# Patient Record
Sex: Male | Born: 1940
Health system: Southern US, Community
[De-identification: ages and names within clinical notes are randomized; demographics above are authoritative.]

## PROBLEM LIST (undated history)

## (undated) DIAGNOSIS — E785 Hyperlipidemia, unspecified: Secondary | ICD-10-CM

## (undated) DIAGNOSIS — K810 Acute cholecystitis: Secondary | ICD-10-CM

## (undated) DIAGNOSIS — Z972 Presence of dental prosthetic device (complete) (partial): Secondary | ICD-10-CM

## (undated) DIAGNOSIS — I639 Cerebral infarction, unspecified: Secondary | ICD-10-CM

## (undated) DIAGNOSIS — H11442 Conjunctival cysts, left eye: Secondary | ICD-10-CM

## (undated) DIAGNOSIS — K469 Unspecified abdominal hernia without obstruction or gangrene: Secondary | ICD-10-CM

## (undated) DIAGNOSIS — I63412 Cerebral infarction due to embolism of left middle cerebral artery: Secondary | ICD-10-CM

## (undated) DIAGNOSIS — N200 Calculus of kidney: Secondary | ICD-10-CM

## (undated) DIAGNOSIS — I2510 Atherosclerotic heart disease of native coronary artery without angina pectoris: Secondary | ICD-10-CM

## (undated) DIAGNOSIS — I10 Essential (primary) hypertension: Secondary | ICD-10-CM

## (undated) DIAGNOSIS — C61 Malignant neoplasm of prostate: Secondary | ICD-10-CM

## (undated) DIAGNOSIS — C4442 Squamous cell carcinoma of skin of scalp and neck: Secondary | ICD-10-CM

## (undated) DIAGNOSIS — Z974 Presence of external hearing-aid: Secondary | ICD-10-CM

## (undated) HISTORY — PX: EYE SURGERY: SHX253

## (undated) HISTORY — DX: Hyperlipidemia, unspecified: E78.5

## (undated) HISTORY — DX: Unspecified abdominal hernia without obstruction or gangrene: K46.9

## (undated) HISTORY — DX: Squamous cell carcinoma of skin of scalp and neck: C44.42

## (undated) HISTORY — DX: Atherosclerotic heart disease of native coronary artery without angina pectoris: I25.10

## (undated) HISTORY — DX: Essential (primary) hypertension: I10

## (undated) HISTORY — DX: Cerebral infarction due to embolism of left middle cerebral artery: I63.412

## (undated) HISTORY — DX: Calculus of kidney: N20.0

## (undated) HISTORY — PX: FUNCTIONAL ENDOSCOPIC SINUS SURGERY: SUR616

---
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
## 2008-07-29 LAB — HM COLONOSCOPY: HM Colonoscopy: NORMAL

## 2010-09-20 ENCOUNTER — Inpatient Hospital Stay: Payer: Self-pay | Admitting: Internal Medicine

## 2011-05-23 ENCOUNTER — Other Ambulatory Visit: Payer: Self-pay | Admitting: Internal Medicine

## 2011-05-23 DIAGNOSIS — I10 Essential (primary) hypertension: Secondary | ICD-10-CM

## 2011-05-23 MED ORDER — QUINAPRIL HCL 20 MG PO TABS: 20.0000 mg | ORAL_TABLET | Freq: Two times a day (BID) | ORAL | Status: DC

## 2011-05-23 MED ORDER — SPIRONOLACTONE 50 MG PO TABS: 50.0000 mg | ORAL_TABLET | Freq: Every day | ORAL | Status: DC

## 2011-05-23 MED ORDER — HYDROCHLOROTHIAZIDE 25 MG PO TABS
25.0000 mg | ORAL_TABLET | Freq: Every day | ORAL | Status: DC
Start: 2011-05-23 — End: 2012-05-12

## 2011-07-03 ENCOUNTER — Encounter: Payer: Self-pay | Admitting: Internal Medicine

## 2011-07-03 ENCOUNTER — Ambulatory Visit (INDEPENDENT_AMBULATORY_CARE_PROVIDER_SITE_OTHER): Payer: Medicare Other | Admitting: Internal Medicine

## 2011-07-03 DIAGNOSIS — E1169 Type 2 diabetes mellitus with other specified complication: Secondary | ICD-10-CM | POA: Insufficient documentation

## 2011-07-03 DIAGNOSIS — M79609 Pain in unspecified limb: Secondary | ICD-10-CM

## 2011-07-03 DIAGNOSIS — I10 Essential (primary) hypertension: Secondary | ICD-10-CM

## 2011-07-03 DIAGNOSIS — M79673 Pain in unspecified foot: Secondary | ICD-10-CM

## 2011-07-03 DIAGNOSIS — E119 Type 2 diabetes mellitus without complications: Secondary | ICD-10-CM

## 2011-07-03 DIAGNOSIS — E785 Hyperlipidemia, unspecified: Secondary | ICD-10-CM

## 2011-07-03 DIAGNOSIS — Z23 Encounter for immunization: Secondary | ICD-10-CM

## 2011-07-03 LAB — COMPREHENSIVE METABOLIC PANEL
AST: 25 U/L (ref 0–37)
Alkaline Phosphatase: 65 U/L (ref 39–117)
Glucose, Bld: 145 mg/dL — ABNORMAL HIGH (ref 70–99)
Potassium: 4.1 mEq/L (ref 3.5–5.1)
Sodium: 140 mEq/L (ref 135–145)
Total Bilirubin: 1 mg/dL (ref 0.3–1.2)
Total Protein: 7.5 g/dL (ref 6.0–8.3)

## 2011-07-03 LAB — CBC WITH DIFFERENTIAL/PLATELET
Basophils Absolute: 0 10*3/uL (ref 0.0–0.1)
Eosinophils Absolute: 0.6 10*3/uL (ref 0.0–0.7)
Eosinophils Relative: 7.6 % — ABNORMAL HIGH (ref 0.0–5.0)
HCT: 40.7 % (ref 39.0–52.0)
Lymphs Abs: 1.7 10*3/uL (ref 0.7–4.0)
MCHC: 33.8 g/dL (ref 30.0–36.0)
MCV: 94.9 fl (ref 78.0–100.0)
Monocytes Absolute: 0.6 10*3/uL (ref 0.1–1.0)
Neutrophils Relative %: 60.9 % (ref 43.0–77.0)
Platelets: 205 10*3/uL (ref 150.0–400.0)
RDW: 12.6 % (ref 11.5–14.6)

## 2011-07-03 LAB — MICROALBUMIN / CREATININE URINE RATIO: Microalb, Ur: 0.5 mg/dL (ref 0.0–1.9)

## 2011-07-03 NOTE — Progress Notes (Signed)
Subjective:    Patient ID: Guy Phillips, male    DOB: Apr 09, 1941, 70 y.o.   MRN: [id number]  HPI Guy Phillips is a 70 year old male who presents for followup of diabetes, hypertension, and hyperlipidemia. He reports that he's been doing. Well. He denies any complaints. He reports that his blood sugars have been well controlled and less than 200. He did not bring a record of his blood sugars today. His only concern today is intermittent pain in his bilateral posterior heels. He notes that this pain typically occurs when he first wakes up in the morning and then improves throughout the day. He denies any swelling over his heels or ankles. He denies any fever or chills. He has not taken any medication for this pain and describes it as mild.  Outpatient Encounter Prescriptions as of 07/03/2011  Medication Sig Dispense Refill  . aspirin 81 MG tablet Take 81 mg by mouth daily.        . carvedilol (COREG) 12.5 MG tablet Take 12.5 mg by mouth every 12 (twelve) hours.        . Cholecalciferol (VITAMIN D3) 2000 UNITS TABS Take 1 tablet by mouth daily.        . Cinnamon 500 MG capsule Take 500 mg by mouth daily.        . clopidogrel (PLAVIX) 75 MG tablet Take 75 mg by mouth daily.        Marland Kitchen glipiZIDE (GLUCOTROL) 5 MG tablet Take 5 mg by mouth daily.        . hydrochlorothiazide 25 MG tablet Take 1 tablet (25 mg total) by mouth daily.  30 tablet  3  . metFORMIN (GLUCOPHAGE) 1000 MG tablet Take 1,000 mg by mouth 2 (two) times daily with a meal.        . Multiple Vitamin (MULTIVITAMIN) tablet Take 1 tablet by mouth daily.        . Omega-3 Fatty Acids (FISH OIL) 1200 MG CAPS Take 1 capsule by mouth daily.        . quinapril (ACCUPRIL) 20 MG tablet Take 1 tablet (20 mg total) by mouth 2 (two) times daily.  60 tablet  11  . rosuvastatin (CRESTOR) 20 MG tablet Take 20 mg by mouth daily.        . Saw Palmetto 450 MG CAPS Take 1 capsule by mouth 2 (two) times daily.        Marland Kitchen spironolactone (ALDACTONE) 50 MG  tablet Take 1 tablet (50 mg total) by mouth daily.  30 tablet  3  . Tadalafil (CIALIS PO) Take 1 tablet by mouth as needed.          Review of Systems  Constitutional: Negative for fever, chills, activity change, appetite change, fatigue and unexpected weight change.  Eyes: Negative for visual disturbance.  Respiratory: Negative for cough and shortness of breath.   Cardiovascular: Negative for chest pain, palpitations and leg swelling.  Gastrointestinal: Negative for abdominal pain and abdominal distention.  Genitourinary: Negative for dysuria, urgency and difficulty urinating.  Musculoskeletal: Negative for arthralgias and gait problem.  Skin: Negative for color change and rash.  Hematological: Negative for adenopathy.  Psychiatric/Behavioral: Negative for sleep disturbance and dysphoric mood. The patient is not nervous/anxious.    BP 133/79  Pulse 62  Temp(Src) 98.5 F (36.9 C) (Oral)  Resp 16  Ht 5\' 8"  (1.727 m)  Wt 259 lb 4 oz (117.595 kg)  BMI 39.42 kg/m2  SpO2 97%     Objective:   Physical  Exam  Constitutional: He is oriented to person, place, and time. He appears well-developed and well-nourished. No distress.  HENT:  Head: Normocephalic and atraumatic.  Right Ear: External ear normal.  Left Ear: External ear normal.  Nose: Nose normal.  Mouth/Throat: Oropharynx is clear and moist. No oropharyngeal exudate.  Eyes: Conjunctivae and EOM are normal. Pupils are equal, round, and reactive to light. Right eye exhibits no discharge. Left eye exhibits no discharge. No scleral icterus.  Neck: Normal range of motion. Neck supple. No tracheal deviation present. No thyromegaly present.  Cardiovascular: Normal rate, regular rhythm and normal heart sounds.  Exam reveals no gallop and no friction rub.   No murmur heard. Pulmonary/Chest: Effort normal and breath sounds normal. No respiratory distress. He has no wheezes. He has no rales. He exhibits no tenderness.  Musculoskeletal:  Normal range of motion. He exhibits no edema.  Lymphadenopathy:    He has no cervical adenopathy.  Neurological: He is alert and oriented to person, place, and time. No cranial nerve deficit. Coordination normal.  Skin: Skin is warm and dry. No rash noted. He is not diaphoretic. No erythema. No pallor.  Psychiatric: He has a normal mood and affect. His behavior is normal. Judgment and thought content normal.          Assessment & Plan:  1. Diabetes - patient reports good control. Will check hemoglobin A1c with labs today. We'll continue metformin and glipizide.  2. Hyperlipidemia - patient with hyperlipidemia on statin. We'll check lipids and LFTs with labs today. Goal LDL less than 70.   3. Hypertension - Patient with hypertension. Blood pressure well-controlled today. We'll check renal function with labs. We'll have him followup in 3 months.  4. Bilateral heel pain - exam is remarkable for thickening over the Achilles tendon. Suspect Achilles tendinitis. We discussed using anti-inflammatories. We discussed referral to podiatry for steroid injection. He would prefer to just monitor for now given that symptoms are mild. He will call or return to clinic if symptoms worsen.

## 2011-07-17 ENCOUNTER — Encounter: Payer: Self-pay | Admitting: Internal Medicine

## 2011-07-24 DIAGNOSIS — I272 Pulmonary hypertension, unspecified: Secondary | ICD-10-CM | POA: Insufficient documentation

## 2011-08-01 ENCOUNTER — Encounter: Payer: Self-pay | Admitting: Internal Medicine

## 2011-10-07 ENCOUNTER — Ambulatory Visit: Payer: Medicare Other | Admitting: Internal Medicine

## 2011-10-08 ENCOUNTER — Encounter: Payer: Self-pay | Admitting: Internal Medicine

## 2011-10-08 ENCOUNTER — Ambulatory Visit (INDEPENDENT_AMBULATORY_CARE_PROVIDER_SITE_OTHER): Payer: Medicare Other | Admitting: Internal Medicine

## 2011-10-08 DIAGNOSIS — N3941 Urge incontinence: Secondary | ICD-10-CM | POA: Diagnosis not present

## 2011-10-08 DIAGNOSIS — N529 Male erectile dysfunction, unspecified: Secondary | ICD-10-CM

## 2011-10-08 DIAGNOSIS — E785 Hyperlipidemia, unspecified: Secondary | ICD-10-CM | POA: Diagnosis not present

## 2011-10-08 DIAGNOSIS — E119 Type 2 diabetes mellitus without complications: Secondary | ICD-10-CM | POA: Diagnosis not present

## 2011-10-08 DIAGNOSIS — Z23 Encounter for immunization: Secondary | ICD-10-CM

## 2011-10-08 LAB — LIPID PANEL
HDL: 40.1 mg/dL (ref >39.00)
LDL Cholesterol: 88 mg/dL (ref 0–99)
Total CHOL/HDL Ratio: 4
Triglycerides: 166 mg/dL — ABNORMAL HIGH (ref 0.0–149.0)
VLDL: 33.2 mg/dL (ref 0.0–40.0)

## 2011-10-08 LAB — COMPREHENSIVE METABOLIC PANEL
AST: 21 U/L (ref 0–37)
Alkaline Phosphatase: 57 U/L (ref 39–117)
BUN: 17 mg/dL (ref 6–23)
Creatinine, Ser: 1 mg/dL (ref 0.4–1.5)
Potassium: 4.1 mEq/L (ref 3.5–5.1)

## 2011-10-08 MED ORDER — FESOTERODINE FUMARATE ER 8 MG PO TB24: 8.0000 mg | ORAL_TABLET | Freq: Every day | ORAL | Status: DC

## 2011-10-08 MED ORDER — CLOPIDOGREL BISULFATE 75 MG PO TABS: 75.0000 mg | ORAL_TABLET | Freq: Every day | ORAL | Status: DC

## 2011-10-08 MED ORDER — TADALAFIL 20 MG PO TABS: 20.0000 mg | ORAL_TABLET | Freq: Every day | ORAL | Status: AC | PRN

## 2011-10-08 MED ORDER — ROSUVASTATIN CALCIUM 20 MG PO TABS
20.0000 mg | ORAL_TABLET | Freq: Every day | ORAL | Status: DC
Start: 2011-10-08 — End: 2012-10-21

## 2011-10-08 MED ORDER — METFORMIN HCL 1000 MG PO TABS: 1000.0000 mg | ORAL_TABLET | Freq: Two times a day (BID) | ORAL | Status: DC

## 2011-10-08 NOTE — Progress Notes (Signed)
Subjective:    Patient ID: Guy Phillips, male    DOB: 06-12-1941, 71 y.o.   MRN: [id number]  HPI 72 year old male with a history of CAD, diabetes, hypertension, hyperlipidemia presents for followup. He reports that he has been doing very well. In regards to his diabetes, he reports that his blood sugars have been well-controlled. He denies any blood sugars greater than 250 or below 80. He reports full compliance with his medication. He does note some dietary indiscretion over the holidays. He is trying to improve his diet and increase physical activity.  In regards to his hypertension he reports full compliance with medication. He denies any chest pain, palpitations, headache.  In regards to his hyperlipidemia he also reports full compliance with this medication. He denies any myalgia with use of Crestor.  He does note some recent urgent continence. He notes that he gets encouraged to urinate and then is unable to make it to the restroom in time. He has not had any dysuria, fever, chills, flank pain, change in his urine stream, or other symptoms.  Outpatient Encounter Prescriptions as of 10/08/2011  Medication Sig Dispense Refill  . aspirin 81 MG tablet Take 81 mg by mouth daily.        . carvedilol (COREG) 12.5 MG tablet Take 12.5 mg by mouth every 12 (twelve) hours.        . Cholecalciferol (VITAMIN D3) 2000 UNITS TABS Take 1 tablet by mouth daily.        . Cinnamon 500 MG capsule Take 500 mg by mouth daily.        . clopidogrel (PLAVIX) 75 MG tablet Take 1 tablet (75 mg total) by mouth daily.  90 tablet  4  . Fesoterodine Fumarate (TOVIAZ) 8 MG TB24 Take 1 tablet (8 mg total) by mouth daily.  30 tablet  0  . glipiZIDE (GLUCOTROL) 5 MG tablet Take 5 mg by mouth daily.        . hydrochlorothiazide 25 MG tablet Take 1 tablet (25 mg total) by mouth daily.  30 tablet  3  . metFORMIN (GLUCOPHAGE) 1000 MG tablet Take 1 tablet (1,000 mg total) by mouth 2 (two) times daily with a meal.  180  tablet  4  . Multiple Vitamin (MULTIVITAMIN) tablet Take 1 tablet by mouth daily.        . Omega-3 Fatty Acids (FISH OIL) 1200 MG CAPS Take 1 capsule by mouth daily.        . quinapril (ACCUPRIL) 20 MG tablet Take 1 tablet (20 mg total) by mouth 2 (two) times daily.  60 tablet  11  . rosuvastatin (CRESTOR) 20 MG tablet Take 1 tablet (20 mg total) by mouth daily.  90 tablet  4  . Saw Palmetto 450 MG CAPS Take 1 capsule by mouth 2 (two) times daily.        Marland Kitchen spironolactone (ALDACTONE) 50 MG tablet Take 1 tablet (50 mg total) by mouth daily.  30 tablet  3  . tadalafil (CIALIS) 20 MG tablet Take 1 tablet (20 mg total) by mouth daily as needed.  90 tablet  4    Review of Systems  Constitutional: Negative for fever, chills, activity change, appetite change, fatigue and unexpected weight change.  Eyes: Negative for visual disturbance.  Respiratory: Negative for cough and shortness of breath.   Cardiovascular: Negative for chest pain, palpitations and leg swelling.  Gastrointestinal: Negative for abdominal pain and abdominal distention.  Genitourinary: Negative for dysuria, urgency and difficulty urinating.  Musculoskeletal: Negative for arthralgias and gait problem.  Skin: Negative for color change and rash.  Hematological: Negative for adenopathy.  Psychiatric/Behavioral: Negative for sleep disturbance and dysphoric mood. The patient is not nervous/anxious.    BP 136/62  Pulse 61  Temp(Src) 98.2 F (36.8 C) (Oral)  Ht 5\' 8"  (1.727 m)  Wt 264 lb (119.75 kg)  BMI 40.14 kg/m2  SpO2 96%     Objective:   Physical Exam  Constitutional: He is oriented to person, place, and time. He appears well-developed and well-nourished. No distress.  HENT:  Head: Normocephalic and atraumatic.  Right Ear: External ear normal.  Left Ear: External ear normal.  Nose: Nose normal.  Mouth/Throat: Oropharynx is clear and moist. No oropharyngeal exudate.  Eyes: Conjunctivae and EOM are normal. Pupils are  equal, round, and reactive to light. Right eye exhibits no discharge. Left eye exhibits no discharge. No scleral icterus.  Neck: Normal range of motion. Neck supple. No tracheal deviation present. No thyromegaly present.  Cardiovascular: Normal rate and regular rhythm.  Exam reveals no gallop and no friction rub.   Murmur (systolic RUSB) heard. Pulmonary/Chest: Effort normal and breath sounds normal. No respiratory distress. He has no wheezes. He has no rales. He exhibits no tenderness.  Musculoskeletal: Normal range of motion. He exhibits no edema.  Lymphadenopathy:    He has no cervical adenopathy.  Neurological: He is alert and oriented to person, place, and time. No cranial nerve deficit. Coordination normal.  Skin: Skin is warm and dry. No rash noted. He is not diaphoretic. No erythema. No pallor.  Psychiatric: He has a normal mood and affect. His behavior is normal. Judgment and thought content normal.          Assessment & Plan:  1. Diabetes - will check hemoglobin A1c with labs today. Plan to continue current medications. Goal hemoglobin A1c be less than 7. Followup in 3 months.  2. Hypertension - blood pressure well-controlled today. We'll continue current medications. We'll check renal function with labs today. Followup in 3 months.  3. Hyperlipidemia - will check cholesterol labs today. Goal LDL would be less than 70 given history of coronary artery disease. Will continue Crestor. Will followup in 3 months.  4. Urge urinary incontinence - Will check urinalysis today. Will try sample of toviaz to see if any improvement in symptoms.

## 2011-10-09 ENCOUNTER — Telehealth: Payer: Self-pay | Admitting: *Deleted

## 2011-10-09 NOTE — Telephone Encounter (Signed)
Mychart message sent.

## 2011-10-09 NOTE — Telephone Encounter (Signed)
Message copied by Vernie Murders on Wed Oct 09, 2011  6:08 PM ------      Message from: Ronna Polio A      Created: Tue Oct 08, 2011  7:49 PM       I forgot that I wanted to check a urinalysis on this patient before he left. Any please ask him to return to clinic in drop of urine sample. I also wanted to give him a sample of Toviaz 4 mg to take daily as needed.

## 2011-10-10 ENCOUNTER — Other Ambulatory Visit: Payer: Self-pay | Admitting: Internal Medicine

## 2011-10-10 ENCOUNTER — Other Ambulatory Visit: Payer: Medicare Other

## 2011-10-10 DIAGNOSIS — E119 Type 2 diabetes mellitus without complications: Secondary | ICD-10-CM | POA: Diagnosis not present

## 2011-10-10 DIAGNOSIS — N390 Urinary tract infection, site not specified: Secondary | ICD-10-CM | POA: Diagnosis not present

## 2011-10-11 ENCOUNTER — Encounter: Payer: Self-pay | Admitting: *Deleted

## 2011-10-11 ENCOUNTER — Other Ambulatory Visit: Payer: Self-pay | Admitting: *Deleted

## 2011-10-11 LAB — URINALYSIS
Glucose, UA: NEGATIVE mg/dL
Hgb urine dipstick: NEGATIVE
Ketones, ur: NEGATIVE mg/dL
Nitrite: NEGATIVE
Specific Gravity, Urine: 1.024 (ref 1.005–1.030)
pH: 5.5 (ref 5.0–8.0)

## 2011-10-11 LAB — MICROALBUMIN / CREATININE URINE RATIO: Microalb Creat Ratio: 6.5 mg/g (ref 0.0–30.0)

## 2011-10-11 MED ORDER — CIPROFLOXACIN HCL 500 MG PO TABS: ORAL_TABLET | ORAL | Status: DC

## 2011-10-11 MED ORDER — CIPROFLOXACIN HCL 500 MG PO TABS: 500.0000 mg | ORAL_TABLET | Freq: Two times a day (BID) | ORAL | Status: DC

## 2011-10-11 NOTE — Telephone Encounter (Signed)
Patient advised to call office for urinalysis repeat after finishing antibiotic.

## 2011-10-13 ENCOUNTER — Telehealth: Payer: Self-pay | Admitting: Internal Medicine

## 2011-10-13 LAB — URINE CULTURE: Organism ID, Bacteria: NO GROWTH

## 2011-10-13 NOTE — Telephone Encounter (Signed)
See pt message

## 2011-10-14 ENCOUNTER — Encounter: Payer: Self-pay | Admitting: Internal Medicine

## 2011-10-28 ENCOUNTER — Other Ambulatory Visit (INDEPENDENT_AMBULATORY_CARE_PROVIDER_SITE_OTHER): Payer: Medicare Other | Admitting: *Deleted

## 2011-10-28 ENCOUNTER — Telehealth: Payer: Self-pay | Admitting: Internal Medicine

## 2011-10-28 DIAGNOSIS — N3941 Urge incontinence: Secondary | ICD-10-CM

## 2011-10-28 DIAGNOSIS — N419 Inflammatory disease of prostate, unspecified: Secondary | ICD-10-CM | POA: Diagnosis not present

## 2011-10-28 LAB — POCT URINALYSIS DIPSTICK
Nitrite, UA: NEGATIVE
Protein, UA: NEGATIVE
pH, UA: 5.5

## 2011-10-28 NOTE — Telephone Encounter (Signed)
Message copied by Vernie Murders on Mon Oct 28, 2011  2:31 PM ------      Message from: Ronna Polio A      Created: Mon Oct 28, 2011 11:25 AM       Can you please send urine for culture. Given that there is persistent blood and leukocytes, I would like to set pt up with urology for evaluation.

## 2011-10-28 NOTE — Telephone Encounter (Signed)
Pt stated that the sample of toviaz 8mg  worked good Pt would like to get 1 year rx with 90 supply  Pt wanted to know if he needed to take this med everyday [facility]  Mail in  Please call when rx is ready for pt to pick up

## 2011-10-28 NOTE — Telephone Encounter (Signed)
Please see message from Robin. Should she continue Toviaz? And refer to urology?

## 2011-10-28 NOTE — Telephone Encounter (Signed)
He can continue Guy Phillips if this is helping.  He needs to see urology though because of persistent hematuria.

## 2011-10-30 LAB — URINE CULTURE: Colony Count: NO GROWTH

## 2011-10-30 MED ORDER — FESOTERODINE FUMARATE ER 8 MG PO TB24: 8.0000 mg | ORAL_TABLET | Freq: Every day | ORAL | Status: DC

## 2011-10-30 NOTE — Telephone Encounter (Signed)
Patient informed of referral, he wants to pick up RX to mail in himself. It will be ready today, pt is aware

## 2011-11-04 DIAGNOSIS — L723 Sebaceous cyst: Secondary | ICD-10-CM | POA: Diagnosis not present

## 2011-11-04 DIAGNOSIS — L821 Other seborrheic keratosis: Secondary | ICD-10-CM | POA: Diagnosis not present

## 2011-11-04 DIAGNOSIS — L570 Actinic keratosis: Secondary | ICD-10-CM | POA: Diagnosis not present

## 2011-11-18 DIAGNOSIS — Z87442 Personal history of urinary calculi: Secondary | ICD-10-CM | POA: Diagnosis not present

## 2011-11-18 DIAGNOSIS — N529 Male erectile dysfunction, unspecified: Secondary | ICD-10-CM | POA: Diagnosis not present

## 2011-11-18 DIAGNOSIS — N3941 Urge incontinence: Secondary | ICD-10-CM | POA: Diagnosis not present

## 2011-11-19 DIAGNOSIS — R0982 Postnasal drip: Secondary | ICD-10-CM | POA: Diagnosis not present

## 2011-11-19 DIAGNOSIS — J301 Allergic rhinitis due to pollen: Secondary | ICD-10-CM | POA: Diagnosis not present

## 2011-12-17 ENCOUNTER — Telehealth: Payer: Self-pay | Admitting: Internal Medicine

## 2011-12-17 NOTE — Telephone Encounter (Signed)
Patient states he developed cough, nasal and head congestion. Onset 12/16/11. Temp. 100.4 orally @ 0950 12/17/11. Marland Kitchen Patient taking fluids well. States intermittent wheezing noted. Triage per Breathing Problems Protocol. No emergent sx identified. Care advice given per guidelines. Patient advised increased fluids, warm fluids, saline nasal washes/netty pot, inhaled steam, humidfier. Call back parameters reviewed. Patient verbalizes understanding. RN spoke with Maralyn Sago RN in office regarding scheduling available and disposition of see within 4 hours. Call transferred to [facility] in office.

## 2011-12-18 ENCOUNTER — Encounter: Payer: Self-pay | Admitting: Internal Medicine

## 2011-12-18 ENCOUNTER — Ambulatory Visit: Payer: Medicare Other | Admitting: Internal Medicine

## 2011-12-18 ENCOUNTER — Ambulatory Visit (INDEPENDENT_AMBULATORY_CARE_PROVIDER_SITE_OTHER): Payer: Medicare Other | Admitting: Internal Medicine

## 2011-12-18 DIAGNOSIS — J111 Influenza due to unidentified influenza virus with other respiratory manifestations: Secondary | ICD-10-CM | POA: Diagnosis not present

## 2011-12-18 DIAGNOSIS — R509 Fever, unspecified: Secondary | ICD-10-CM

## 2011-12-18 DIAGNOSIS — J40 Bronchitis, not specified as acute or chronic: Secondary | ICD-10-CM | POA: Diagnosis not present

## 2011-12-18 LAB — POCT INFLUENZA A/B
Influenza A, POC: POSITIVE
Influenza B, POC: POSITIVE

## 2011-12-18 MED ORDER — OSELTAMIVIR PHOSPHATE 75 MG PO CAPS: 75.0000 mg | ORAL_CAPSULE | Freq: Two times a day (BID) | ORAL | Status: AC

## 2011-12-18 MED ORDER — LEVOFLOXACIN 750 MG PO TABS: 750.0000 mg | ORAL_TABLET | Freq: Every day | ORAL | Status: AC

## 2011-12-18 NOTE — Telephone Encounter (Signed)
Patient is coming in for an appointment on 12/18/11.

## 2011-12-18 NOTE — Assessment & Plan Note (Signed)
Symptoms c/w influenza. Rapid test pos.  Will start tamiflu given that symptoms less than 24hr.  Pt will increase fluid intake and use tylenol prn fever.  He will call if symptoms not improving over next 48hr.  He will start antibiotics this weekend if persistent fever, productive cough (office is closed because of holiday).

## 2011-12-18 NOTE — Progress Notes (Signed)
Addended by: Vernie Murders on: 12/18/2011 04:51 PM   Modules accepted: Orders

## 2011-12-18 NOTE — Progress Notes (Signed)
Subjective:    Patient ID: Guy Phillips, male    DOB: 1940/12/21, 71 y.o.   MRN: [id number]  HPI 71YO male with CAD, HTN presents for acute visit c/o 24hr of fever, chills, malaise, diffuse myalgia, non-productive cough.  Wife was sick with similar symptoms starting last week.  Has not taken any medication for symptoms.  Denies shortness of breath, chest pain.  Outpatient Encounter Prescriptions as of 12/18/2011  Medication Sig Dispense Refill  . aspirin 81 MG tablet Take 81 mg by mouth daily.        . carvedilol (COREG) 12.5 MG tablet Take 12.5 mg by mouth every 12 (twelve) hours.        . Cholecalciferol (VITAMIN D3) 2000 UNITS TABS Take 1 tablet by mouth daily.        . Cinnamon 500 MG capsule Take 500 mg by mouth daily.        . ciprofloxacin (CIPRO) 500 MG tablet Take 1 tablet (500 mg total) by mouth 2 (two) times daily. Will need nurse visit to repeat urinalysis after finishing antibiotic.  28 tablet  0  . clopidogrel (PLAVIX) 75 MG tablet Take 1 tablet (75 mg total) by mouth daily.  90 tablet  4  . Fesoterodine Fumarate (TOVIAZ) 8 MG TB24 Take 1 tablet (8 mg total) by mouth daily.  90 tablet  0  . glipiZIDE (GLUCOTROL) 5 MG tablet Take 5 mg by mouth daily.        . hydrochlorothiazide 25 MG tablet Take 1 tablet (25 mg total) by mouth daily.  30 tablet  3  . metFORMIN (GLUCOPHAGE) 1000 MG tablet Take 1 tablet (1,000 mg total) by mouth 2 (two) times daily with a meal.  180 tablet  4  . Multiple Vitamin (MULTIVITAMIN) tablet Take 1 tablet by mouth daily.        . Omega-3 Fatty Acids (FISH OIL) 1200 MG CAPS Take 1 capsule by mouth daily.        . quinapril (ACCUPRIL) 20 MG tablet Take 1 tablet (20 mg total) by mouth 2 (two) times daily.  60 tablet  11  . rosuvastatin (CRESTOR) 20 MG tablet Take 1 tablet (20 mg total) by mouth daily.  90 tablet  4  . Saw Palmetto 450 MG CAPS Take 1 capsule by mouth 2 (two) times daily.        Marland Kitchen spironolactone (ALDACTONE) 50 MG tablet Take 1 tablet (50  mg total) by mouth daily.  30 tablet  3  . levofloxacin (LEVAQUIN) 750 MG tablet Take 1 tablet (750 mg total) by mouth daily.  7 tablet  0  . oseltamivir (TAMIFLU) 75 MG capsule Take 1 capsule (75 mg total) by mouth 2 (two) times daily.  10 capsule  0    Review of Systems  Constitutional: Positive for fever, chills and fatigue. Negative for activity change.  HENT: Negative for hearing loss, ear pain, nosebleeds, congestion, sore throat, rhinorrhea, sneezing, trouble swallowing, neck pain, neck stiffness, voice change, postnasal drip, sinus pressure, tinnitus and ear discharge.   Eyes: Negative for discharge, redness, itching and visual disturbance.  Respiratory: Positive for cough. Negative for chest tightness, shortness of breath, wheezing and stridor.   Cardiovascular: Negative for chest pain and leg swelling.  Musculoskeletal: Positive for myalgias. Negative for arthralgias.  Skin: Negative for color change and rash.  Neurological: Negative for dizziness, facial asymmetry and headaches.  Psychiatric/Behavioral: Negative for sleep disturbance.       Objective:   Physical Exam  Constitutional: He is oriented to person, place, and time. He appears well-developed and well-nourished. No distress.  HENT:  Head: Normocephalic and atraumatic.  Right Ear: External ear normal. Tympanic membrane is erythematous. A middle ear effusion is present.  Left Ear: External ear normal. A middle ear effusion is present.  Nose: Nose normal.  Mouth/Throat: Oropharynx is clear and moist. No oropharyngeal exudate.  Eyes: Conjunctivae and EOM are normal. Pupils are equal, round, and reactive to light. Right eye exhibits no discharge. Left eye exhibits no discharge. No scleral icterus.  Neck: Normal range of motion. Neck supple. No tracheal deviation present. No thyromegaly present.  Cardiovascular: Normal rate, regular rhythm and normal heart sounds.  Exam reveals no gallop and no friction rub.   No murmur  heard. Pulmonary/Chest: Effort normal. No accessory muscle usage. Not tachypneic. No respiratory distress. He has no wheezes. He has rhonchi. He has no rales. He exhibits no tenderness.  Musculoskeletal: Normal range of motion. He exhibits no edema.  Lymphadenopathy:    He has no cervical adenopathy.  Neurological: He is alert and oriented to person, place, and time. No cranial nerve deficit. Coordination normal.  Skin: Skin is warm and dry. No rash noted. He is not diaphoretic. No erythema. No pallor.  Psychiatric: He has a normal mood and affect. His behavior is normal. Judgment and thought content normal.          Assessment & Plan:

## 2011-12-18 NOTE — Telephone Encounter (Signed)
[facility] Triage Call Report Triage Record Num: [id number] Operator: Patriciaann Clan Patient Name: Guy Phillips Call Date & Time: 12/17/2011 9:41:53AM Patient Phone: [phone] PCP: Ronna Polio Caller Name: Quentin Cornwall Relationship to Patient: Unknown Patient Gender: Male PCP Fax : [phone] Patient DOB: 12-04-1940 Practice Name: [facility] Reason for Call: Patient states he developed cough, nasal and head congestion. Onset 12/16/11. Temp. 100.4 orally @ 0950 12/17/11. Marland Kitchen Patient taking fluids well. States intermittent wheezing noted. Triage per Breathing Problems Protocol. No emergent sx identified. Care advice given per guidelines. Patient advised increased fluids, warm fluids, saline nasal washes/netty pot, inhaled steam, humidfier. Call back parameters reviewed. Patient verbalizes understanding. RN spoke with Maralyn Sago RN in office regarding scheduling available and disposition of see within 4 hours. Call transferred to [facility] in office. Protocol(s) Used: Breathing Problems Protocol(s) Used: Upper Respiratory Infection (URI) Recommended Outcome per Protocol: See Provider within 4 hours Reason for Outcome: Breathing symptoms (wheezing, shortness of breath, changes in rates of breathing, skin color changes) main problem New or worsening breathing difficulty AND new onset of bloody or blood-streaked sputum Care Advice: ~ Another adult should drive. ~ Avoid any activity that produces symptoms until evaluated by provider. ~ SYMPTOM / CONDITION MANAGEMENT ~ List, or take, all current prescription(s), nonprescription or alternative medication(s) to provider for evaluation. 12/17/2011 10:21:18AM Page 1 of 1 [email or web address]

## 2011-12-24 DIAGNOSIS — N3941 Urge incontinence: Secondary | ICD-10-CM | POA: Diagnosis not present

## 2011-12-25 ENCOUNTER — Encounter: Payer: Self-pay | Admitting: Internal Medicine

## 2012-01-08 ENCOUNTER — Ambulatory Visit: Payer: Medicare Other | Admitting: Internal Medicine

## 2012-01-08 DIAGNOSIS — I509 Heart failure, unspecified: Secondary | ICD-10-CM | POA: Diagnosis not present

## 2012-01-08 DIAGNOSIS — I359 Nonrheumatic aortic valve disorder, unspecified: Secondary | ICD-10-CM | POA: Diagnosis not present

## 2012-01-08 DIAGNOSIS — I2510 Atherosclerotic heart disease of native coronary artery without angina pectoris: Secondary | ICD-10-CM | POA: Diagnosis not present

## 2012-01-08 DIAGNOSIS — R0609 Other forms of dyspnea: Secondary | ICD-10-CM | POA: Diagnosis not present

## 2012-01-08 DIAGNOSIS — I2789 Other specified pulmonary heart diseases: Secondary | ICD-10-CM | POA: Diagnosis not present

## 2012-01-13 ENCOUNTER — Encounter: Payer: Self-pay | Admitting: Internal Medicine

## 2012-01-13 ENCOUNTER — Ambulatory Visit (INDEPENDENT_AMBULATORY_CARE_PROVIDER_SITE_OTHER): Payer: Medicare Other | Admitting: Internal Medicine

## 2012-01-13 DIAGNOSIS — I2510 Atherosclerotic heart disease of native coronary artery without angina pectoris: Secondary | ICD-10-CM | POA: Insufficient documentation

## 2012-01-13 DIAGNOSIS — I359 Nonrheumatic aortic valve disorder, unspecified: Secondary | ICD-10-CM | POA: Diagnosis not present

## 2012-01-13 DIAGNOSIS — E785 Hyperlipidemia, unspecified: Secondary | ICD-10-CM

## 2012-01-13 DIAGNOSIS — I10 Essential (primary) hypertension: Secondary | ICD-10-CM

## 2012-01-13 DIAGNOSIS — E119 Type 2 diabetes mellitus without complications: Secondary | ICD-10-CM | POA: Diagnosis not present

## 2012-01-13 DIAGNOSIS — R32 Unspecified urinary incontinence: Secondary | ICD-10-CM

## 2012-01-13 DIAGNOSIS — I350 Nonrheumatic aortic (valve) stenosis: Secondary | ICD-10-CM | POA: Insufficient documentation

## 2012-01-13 DIAGNOSIS — N3941 Urge incontinence: Secondary | ICD-10-CM | POA: Insufficient documentation

## 2012-01-13 NOTE — Assessment & Plan Note (Signed)
Followed by Dr. Earma Reading at [facility]. Will get records as to previous evaluation/management.

## 2012-01-13 NOTE — Assessment & Plan Note (Signed)
Followed by urology. On Toviaz, with improvement in symptoms. Will continue. Will request notes from urology on recent evaluation.

## 2012-01-13 NOTE — Progress Notes (Signed)
Subjective:    Patient ID: Guy Phillips, male    DOB: 1941/05/04, 71 y.o.   MRN: [id number]  HPI 71 year old male with history of coronary artery disease, aortic stenosis, hypertension, diabetes, hyperlipidemia, and urinary incontinence presents for followup. He reports that he is doing well. He brings lab work from recent cardiology visit which shows normal kidney function and cholesterol. He reports that his cardiologist told him he is doing well and does not need intervention at this time to correct his aortic valve. In regards to his diabetes, he did not bring a record of his blood sugars, but reports blood sugars have been well-controlled. He reports full compliance with his medication. In regards to urinary incontinence, he reports improvement with the use of Tovias. He does note some constipation with this medication. He was recently seen by urology, but they did not make any new recommendations for medications to help with urinary incontinence.  Outpatient Encounter Prescriptions as of 01/13/2012  Medication Sig Dispense Refill  . aspirin 81 MG tablet Take 81 mg by mouth daily.        . carvedilol (COREG) 12.5 MG tablet Take 12.5 mg by mouth every 12 (twelve) hours.        . Cholecalciferol (VITAMIN D3) 2000 UNITS TABS Take 1 tablet by mouth daily.        . Cinnamon 500 MG capsule Take 500 mg by mouth daily.        . clopidogrel (PLAVIX) 75 MG tablet Take 1 tablet (75 mg total) by mouth daily.  90 tablet  4  . Fesoterodine Fumarate (TOVIAZ) 8 MG TB24 Take 1 tablet (8 mg total) by mouth daily.  90 tablet  0  . fexofenadine (ALLEGRA) 180 MG tablet Take 180 mg by mouth daily.      . fluticasone (FLONASE) 50 MCG/ACT nasal spray Place into the nose Daily.      Marland Kitchen glipiZIDE (GLUCOTROL) 5 MG tablet Take 5 mg by mouth daily.        . hydrochlorothiazide 25 MG tablet Take 1 tablet (25 mg total) by mouth daily.  30 tablet  3  . metFORMIN (GLUCOPHAGE) 1000 MG tablet Take 1 tablet (1,000 mg total)  by mouth 2 (two) times daily with a meal.  180 tablet  4  . Multiple Vitamin (MULTIVITAMIN) tablet Take 1 tablet by mouth daily.        . nitroGLYCERIN (NITROSTAT) 0.4 MG SL tablet Place 0.4 mg under the tongue every 5 (five) minutes as needed.      . Omega-3 Fatty Acids (FISH OIL) 1200 MG CAPS Take 1 capsule by mouth daily.        . quinapril (ACCUPRIL) 20 MG tablet Take 1 tablet (20 mg total) by mouth 2 (two) times daily.  60 tablet  11  . rosuvastatin (CRESTOR) 20 MG tablet Take 1 tablet (20 mg total) by mouth daily.  90 tablet  4  . Saw Palmetto 450 MG CAPS Take 1 capsule by mouth 2 (two) times daily.        Marland Kitchen spironolactone (ALDACTONE) 50 MG tablet Take 1 tablet (50 mg total) by mouth daily.  30 tablet  3  . DISCONTD: ciprofloxacin (CIPRO) 500 MG tablet Take 1 tablet (500 mg total) by mouth 2 (two) times daily. Will need nurse visit to repeat urinalysis after finishing antibiotic.  28 tablet  0    Review of Systems  Constitutional: Negative for fever, chills, activity change, appetite change, fatigue and unexpected weight change.  Eyes: Negative for visual disturbance.  Respiratory: Negative for cough and shortness of breath.   Cardiovascular: Negative for chest pain, palpitations and leg swelling.  Gastrointestinal: Negative for abdominal pain and abdominal distention.  Genitourinary: Negative for dysuria, urgency and difficulty urinating.  Musculoskeletal: Negative for arthralgias and gait problem.  Skin: Negative for color change and rash.  Hematological: Negative for adenopathy.  Psychiatric/Behavioral: Negative for sleep disturbance and dysphoric mood. The patient is not nervous/anxious.    BP 130/64  Pulse 64  Ht 5\' 8"  (1.727 m)  Wt 258 lb (117.028 kg)  BMI 39.23 kg/m2     Objective:   Physical Exam  Constitutional: He is oriented to person, place, and time. He appears well-developed and well-nourished. No distress.  HENT:  Head: Normocephalic and atraumatic.  Right Ear:  External ear normal.  Left Ear: External ear normal.  Nose: Nose normal.  Mouth/Throat: Oropharynx is clear and moist. No oropharyngeal exudate.  Eyes: Conjunctivae and EOM are normal. Pupils are equal, round, and reactive to light. Right eye exhibits no discharge. Left eye exhibits no discharge. No scleral icterus.  Neck: Normal range of motion. Neck supple. No tracheal deviation present. No thyromegaly present.  Cardiovascular: Normal rate and regular rhythm.  Exam reveals no gallop and no friction rub.   Murmur heard. Pulmonary/Chest: Effort normal and breath sounds normal. No respiratory distress. He has no wheezes. He has no rales. He exhibits no tenderness.  Musculoskeletal: Normal range of motion. He exhibits no edema.  Lymphadenopathy:    He has no cervical adenopathy.  Neurological: He is alert and oriented to person, place, and time. No cranial nerve deficit. Coordination normal.  Skin: Skin is warm and dry. No rash noted. He is not diaphoretic. No erythema. No pallor.  Psychiatric: He has a normal mood and affect. His behavior is normal. Judgment and thought content normal.          Assessment & Plan:

## 2012-01-13 NOTE — Assessment & Plan Note (Signed)
Stable. LDL at goal <70. On plavix and aspirin. Recent cardiology evaluation reportedly normal. Will request notes for review.

## 2012-01-13 NOTE — Assessment & Plan Note (Signed)
LDL at goal based on records from [facility] today with LDL 64.  Will repeat lipids in 6 months. Continue current medications.

## 2012-01-13 NOTE — Assessment & Plan Note (Signed)
Blood sugars well controlled based on pt report and labs from 09/2011 with A1c of 6.6%.  Will plan to recheck in 3 months. Continue current medications.

## 2012-01-13 NOTE — Assessment & Plan Note (Signed)
Blood pressures well controlled. Will continue current medications. Follow up 3 months.

## 2012-02-05 ENCOUNTER — Other Ambulatory Visit: Payer: Self-pay | Admitting: *Deleted

## 2012-02-05 DIAGNOSIS — N3941 Urge incontinence: Secondary | ICD-10-CM

## 2012-02-05 DIAGNOSIS — I10 Essential (primary) hypertension: Secondary | ICD-10-CM

## 2012-02-05 MED ORDER — QUINAPRIL HCL 20 MG PO TABS: 20.0000 mg | ORAL_TABLET | Freq: Two times a day (BID) | ORAL | Status: DC

## 2012-02-05 MED ORDER — FESOTERODINE FUMARATE ER 8 MG PO TB24: 8.0000 mg | ORAL_TABLET | Freq: Every day | ORAL | Status: DC

## 2012-02-05 MED ORDER — GLIPIZIDE 5 MG PO TABS: 5.0000 mg | ORAL_TABLET | Freq: Every day | ORAL | Status: DC

## 2012-02-05 MED ORDER — SPIRONOLACTONE 50 MG PO TABS: 50.0000 mg | ORAL_TABLET | Freq: Every day | ORAL | Status: DC

## 2012-02-05 NOTE — Telephone Encounter (Signed)
Rx refills done per [facility] J[facility]S

## 2012-03-09 ENCOUNTER — Other Ambulatory Visit: Payer: Self-pay | Admitting: Internal Medicine

## 2012-03-09 ENCOUNTER — Telehealth: Payer: Self-pay | Admitting: Internal Medicine

## 2012-03-09 MED ORDER — CARVEDILOL 12.5 MG PO TABS: 12.5000 mg | ORAL_TABLET | Freq: Two times a day (BID) | ORAL | Status: DC

## 2012-03-09 NOTE — Telephone Encounter (Signed)
Patient stopped by requesting a new prescription for his carvedilol 12.5 mg he takes 1 in am, and 1 in pm. He needs this called into [facility] for 1 year 90 day supply 3 refills.  Please let patient know when this has been done.

## 2012-04-21 ENCOUNTER — Ambulatory Visit (INDEPENDENT_AMBULATORY_CARE_PROVIDER_SITE_OTHER): Payer: Medicare Other | Admitting: Internal Medicine

## 2012-04-21 ENCOUNTER — Encounter: Payer: Self-pay | Admitting: Internal Medicine

## 2012-04-21 VITALS — BP 130/82 | HR 60 | Temp 98.4°F | Ht 68.0 in | Wt 259.8 lb

## 2012-04-21 DIAGNOSIS — I359 Nonrheumatic aortic valve disorder, unspecified: Secondary | ICD-10-CM

## 2012-04-21 DIAGNOSIS — N393 Stress incontinence (female) (male): Secondary | ICD-10-CM | POA: Diagnosis not present

## 2012-04-21 DIAGNOSIS — I10 Essential (primary) hypertension: Secondary | ICD-10-CM

## 2012-04-21 DIAGNOSIS — E119 Type 2 diabetes mellitus without complications: Secondary | ICD-10-CM

## 2012-04-21 DIAGNOSIS — E669 Obesity, unspecified: Secondary | ICD-10-CM

## 2012-04-21 DIAGNOSIS — I2510 Atherosclerotic heart disease of native coronary artery without angina pectoris: Secondary | ICD-10-CM

## 2012-04-21 DIAGNOSIS — E785 Hyperlipidemia, unspecified: Secondary | ICD-10-CM

## 2012-04-21 DIAGNOSIS — I350 Nonrheumatic aortic (valve) stenosis: Secondary | ICD-10-CM

## 2012-04-21 LAB — COMPREHENSIVE METABOLIC PANEL
ALT: 22 U/L (ref 0–53)
AST: 25 U/L (ref 0–37)
Albumin: 4.1 g/dL (ref 3.5–5.2)
Calcium: 9.7 mg/dL (ref 8.4–10.5)
Chloride: 102 mEq/L (ref 96–112)
Potassium: 4.2 mEq/L (ref 3.5–5.1)

## 2012-04-21 LAB — MICROALBUMIN / CREATININE URINE RATIO
Creatinine,U: 150 mg/dL
Microalb Creat Ratio: 0.4 mg/g (ref 0.0–30.0)

## 2012-04-21 NOTE — Assessment & Plan Note (Signed)
Blood pressure generally well controlled. Will continue current medications. Followup in 3 months.

## 2012-04-21 NOTE — Assessment & Plan Note (Signed)
Currently asymptomatic. On statin, beta blocker, ACE inhibitor, and aspirin. Will continue to monitor.

## 2012-04-21 NOTE — Progress Notes (Signed)
Subjective:    Patient ID: Guy Phillips, male    DOB: 04-04-1941, 71 y.o.   MRN: [id number]  HPI 71 year old male with history of coronary artery disease, diabetes, hypertension, hyperlipidemia presents for followup. He reports that he is generally feeling well. He did not bring a record of his blood sugars today but reports blood sugars have typically been around 120 fasting. He reports full compliance with his medications. He denies any recent chest pain, shortness of breath, diaphoresis, nausea.  In regards to recent history of urinary incontinence she reports that symptoms are well controlled on current medications. He notes he was seen by urologist in evaluation was normal.  Outpatient Encounter Prescriptions as of 04/21/2012  Medication Sig Dispense Refill  . aspirin 81 MG tablet Take 81 mg by mouth daily.        . carvedilol (COREG) 12.5 MG tablet Take 1 tablet (12.5 mg total) by mouth 2 (two) times daily with a meal.  180 tablet  3  . Cholecalciferol (VITAMIN D3) 2000 UNITS TABS Take 1 tablet by mouth daily.        . Cinnamon 500 MG capsule Take 500 mg by mouth daily.        . clopidogrel (PLAVIX) 75 MG tablet Take 1 tablet (75 mg total) by mouth daily.  90 tablet  4  . fesoterodine (TOVIAZ) 8 MG TB24 Take 1 tablet (8 mg total) by mouth daily.  90 tablet  3  . fexofenadine (ALLEGRA) 180 MG tablet Take 180 mg by mouth daily.      . fluticasone (FLONASE) 50 MCG/ACT nasal spray Place into the nose Daily.      Marland Kitchen glipiZIDE (GLUCOTROL) 5 MG tablet Take 1 tablet (5 mg total) by mouth daily.  90 tablet  3  . hydrochlorothiazide 25 MG tablet Take 1 tablet (25 mg total) by mouth daily.  30 tablet  3  . metFORMIN (GLUCOPHAGE) 1000 MG tablet Take 1 tablet (1,000 mg total) by mouth 2 (two) times daily with a meal.  180 tablet  4  . Multiple Vitamin (MULTIVITAMIN) tablet Take 1 tablet by mouth daily.        . nitroGLYCERIN (NITROSTAT) 0.4 MG SL tablet Place 0.4 mg under the tongue every 5  (five) minutes as needed.      . Omega-3 Fatty Acids (FISH OIL) 1200 MG CAPS Take 1 capsule by mouth daily.        . quinapril (ACCUPRIL) 20 MG tablet Take 1 tablet (20 mg total) by mouth 2 (two) times daily.  180 tablet  3  . rosuvastatin (CRESTOR) 20 MG tablet Take 1 tablet (20 mg total) by mouth daily.  90 tablet  4  . Saw Palmetto 450 MG CAPS Take 1 capsule by mouth 2 (two) times daily.        Marland Kitchen spironolactone (ALDACTONE) 50 MG tablet Take 1 tablet (50 mg total) by mouth daily.  90 tablet  3   BP 130/82  Pulse 60  Temp 98.4 F (36.9 C) (Oral)  Ht 5\' 8"  (1.727 m)  Wt 259 lb 12 oz (117.822 kg)  BMI 39.49 kg/m2  SpO2 97%  Review of Systems  Constitutional: Negative for fever, chills, activity change, appetite change, fatigue and unexpected weight change.  Eyes: Negative for visual disturbance.  Respiratory: Negative for cough and shortness of breath.   Cardiovascular: Negative for chest pain, palpitations and leg swelling.  Gastrointestinal: Negative for abdominal pain and abdominal distention.  Genitourinary: Negative for dysuria, urgency  and difficulty urinating.  Musculoskeletal: Negative for arthralgias and gait problem.  Skin: Negative for color change and rash.  Hematological: Negative for adenopathy.  Psychiatric/Behavioral: Negative for disturbed wake/sleep cycle and dysphoric mood. The patient is not nervous/anxious.        Objective:   Physical Exam  Constitutional: He is oriented to person, place, and time. He appears well-developed and well-nourished. No distress.  HENT:  Head: Normocephalic and atraumatic.  Right Ear: External ear normal.  Left Ear: External ear normal.  Nose: Nose normal.  Mouth/Throat: Oropharynx is clear and moist. No oropharyngeal exudate.  Eyes: Conjunctivae and EOM are normal. Pupils are equal, round, and reactive to light. Right eye exhibits no discharge. Left eye exhibits no discharge. No scleral icterus.  Neck: Normal range of motion.  Neck supple. No tracheal deviation present. No thyromegaly present.  Cardiovascular: Normal rate, regular rhythm and normal heart sounds.  Exam reveals no gallop and no friction rub.   No murmur heard. Pulmonary/Chest: Effort normal and breath sounds normal. No respiratory distress. He has no wheezes. He has no rales. He exhibits no tenderness.  Musculoskeletal: Normal range of motion. He exhibits no edema.  Lymphadenopathy:    He has no cervical adenopathy.  Neurological: He is alert and oriented to person, place, and time. No cranial nerve deficit. Coordination normal.  Skin: Skin is warm and dry. No rash noted. He is not diaphoretic. No erythema. No pallor.  Psychiatric: He has a normal mood and affect. His behavior is normal. Judgment and thought content normal.          Assessment & Plan:

## 2012-04-21 NOTE — Assessment & Plan Note (Signed)
Symptoms well controlled with use of Toviaz. Urological evaluation was normal per patient. Followup in 3 months.

## 2012-04-21 NOTE — Assessment & Plan Note (Signed)
Blood sugars well controlled with hemoglobin A1c of 6.5%. We'll continue glipizide and metformin. Encourage continued compliance with healthy diet and regular physical activity with goal of 175 minutes per week. Followup 3 months.

## 2012-04-21 NOTE — Assessment & Plan Note (Signed)
Asymptomatic, doing well. Followed by Dr. Earma Reading at [facility]. Next scheduled followup is October 2013.

## 2012-04-21 NOTE — Assessment & Plan Note (Signed)
LDL is just above goal of less than 70 at 88. We'll plan to continue current medication, Crestor, for now. Encouraged better compliance with diet low in saturated fat and high in fiber. Also encouraged increased physical activity such as walking with goal of 175 minutes per week.

## 2012-05-11 ENCOUNTER — Encounter (INDEPENDENT_AMBULATORY_CARE_PROVIDER_SITE_OTHER): Payer: Medicare Other | Admitting: Internal Medicine

## 2012-05-11 DIAGNOSIS — I10 Essential (primary) hypertension: Secondary | ICD-10-CM

## 2012-05-12 MED ORDER — HYDROCHLOROTHIAZIDE 25 MG PO TABS: 25.0000 mg | ORAL_TABLET | Freq: Every day | ORAL | Status: DC

## 2012-06-16 ENCOUNTER — Ambulatory Visit (INDEPENDENT_AMBULATORY_CARE_PROVIDER_SITE_OTHER): Payer: Medicare Other | Admitting: Internal Medicine

## 2012-06-16 DIAGNOSIS — Z23 Encounter for immunization: Secondary | ICD-10-CM | POA: Diagnosis not present

## 2012-06-26 DIAGNOSIS — H11442 Conjunctival cysts, left eye: Secondary | ICD-10-CM | POA: Insufficient documentation

## 2012-06-26 DIAGNOSIS — H11449 Conjunctival cysts, unspecified eye: Secondary | ICD-10-CM | POA: Diagnosis not present

## 2012-06-26 HISTORY — DX: Conjunctival cysts, left eye: H11.442

## 2012-07-08 DIAGNOSIS — I2510 Atherosclerotic heart disease of native coronary artery without angina pectoris: Secondary | ICD-10-CM | POA: Diagnosis not present

## 2012-07-08 DIAGNOSIS — R0609 Other forms of dyspnea: Secondary | ICD-10-CM | POA: Diagnosis not present

## 2012-07-08 DIAGNOSIS — I359 Nonrheumatic aortic valve disorder, unspecified: Secondary | ICD-10-CM | POA: Diagnosis not present

## 2012-07-08 DIAGNOSIS — I2789 Other specified pulmonary heart diseases: Secondary | ICD-10-CM | POA: Diagnosis not present

## 2012-07-08 DIAGNOSIS — E785 Hyperlipidemia, unspecified: Secondary | ICD-10-CM | POA: Diagnosis not present

## 2012-07-22 DIAGNOSIS — H11449 Conjunctival cysts, unspecified eye: Secondary | ICD-10-CM | POA: Diagnosis not present

## 2012-07-29 ENCOUNTER — Encounter: Payer: Self-pay | Admitting: Internal Medicine

## 2012-07-29 ENCOUNTER — Telehealth: Payer: Self-pay | Admitting: Internal Medicine

## 2012-07-29 ENCOUNTER — Ambulatory Visit (INDEPENDENT_AMBULATORY_CARE_PROVIDER_SITE_OTHER): Payer: Medicare Other | Admitting: Internal Medicine

## 2012-07-29 VITALS — BP 124/60 | HR 60 | Temp 97.7°F | Ht 68.0 in | Wt 256.0 lb

## 2012-07-29 DIAGNOSIS — E119 Type 2 diabetes mellitus without complications: Secondary | ICD-10-CM

## 2012-07-29 DIAGNOSIS — I10 Essential (primary) hypertension: Secondary | ICD-10-CM

## 2012-07-29 DIAGNOSIS — Z0000 Encounter for general adult medical examination without abnormal findings: Secondary | ICD-10-CM

## 2012-07-29 DIAGNOSIS — E785 Hyperlipidemia, unspecified: Secondary | ICD-10-CM | POA: Diagnosis not present

## 2012-07-29 DIAGNOSIS — E669 Obesity, unspecified: Secondary | ICD-10-CM | POA: Diagnosis not present

## 2012-07-29 DIAGNOSIS — Z125 Encounter for screening for malignant neoplasm of prostate: Secondary | ICD-10-CM | POA: Diagnosis not present

## 2012-07-29 LAB — COMPREHENSIVE METABOLIC PANEL
ALT: 22 U/L (ref 0–53)
CO2: 27 mEq/L (ref 19–32)
Calcium: 9.3 mg/dL (ref 8.4–10.5)
Chloride: 101 mEq/L (ref 96–112)
Creatinine, Ser: 1 mg/dL (ref 0.4–1.5)
GFR: 77.61 mL/min (ref >60.00)
Glucose, Bld: 129 mg/dL — ABNORMAL HIGH (ref 70–99)
Sodium: 137 mEq/L (ref 135–145)
Total Protein: 6.9 g/dL (ref 6.0–8.3)

## 2012-07-29 LAB — LIPID PANEL: Total CHOL/HDL Ratio: 5

## 2012-07-29 LAB — MICROALBUMIN / CREATININE URINE RATIO
Microalb Creat Ratio: 0.2 mg/g (ref 0.0–30.0)
Microalb, Ur: 0.2 mg/dL (ref 0.0–1.9)

## 2012-07-29 LAB — POCT URINALYSIS DIPSTICK
Blood, UA: NEGATIVE
Nitrite, UA: NEGATIVE
Protein, UA: NEGATIVE
Urobilinogen, UA: 0.2
pH, UA: 6

## 2012-07-29 LAB — PSA, MEDICARE: PSA: 2.35 ng/ml (ref 0.10–4.00)

## 2012-07-29 LAB — HEMOGLOBIN A1C: Hgb A1c MFr Bld: 6.6 % — ABNORMAL HIGH (ref 4.6–6.5)

## 2012-07-29 NOTE — Assessment & Plan Note (Signed)
Blood pressure is generally been well-controlled on current medications. We'll plan to continue. Followup 3 months.

## 2012-07-29 NOTE — Assessment & Plan Note (Signed)
Will check fasting lipids and LFTs with labs today. Continue Crestor. 

## 2012-07-29 NOTE — Assessment & Plan Note (Signed)
General medical exam normal today. Encouraged patient to continue efforts at healthy diet and regular physical activity. Will send basic labs today including CBC, CMP, A1c, lipid profile, and PSA. We discussed the risk and benefits of PSA testing. Appropriate screening performed. Followup in 3 months or sooner as needed.

## 2012-07-29 NOTE — Assessment & Plan Note (Signed)
Will check A1c with labs today. Continue current medications. Follow up 3 months. 

## 2012-07-29 NOTE — Telephone Encounter (Signed)
Labs normal.

## 2012-07-29 NOTE — Progress Notes (Signed)
Subjective:    Patient ID: Guy Phillips, male    DOB: [birth year], 71 y.o.   MRN: [id number]  HPI The patient is here for annual Medicare wellness examination and management of other chronic and acute problems.   The risk factors are reflected in the social history.  The roster of all physicians providing medical care to patient - is listed in the Snapshot section of the chart.  Activities of daily living:  The patient is 100% independent in all ADLs: dressing, toileting, feeding as well as independent mobility  Home safety : The patient has smoke detectors in the home. They wear seatbelts.  There are no firearms at home. There is no violence in the home.   There is no risks for hepatitis, STDs or HIV. There is no history of blood transfusion. They have no travel history to infectious disease endemic areas of the world.  The patient has seen their dentist in the last six month. (Dr. Jerelyn Scott) They have seen their eye doctor in the last year. (Dr. Threasa Heads - [facility]) No issues with hearing. They have deferred audiologic testing in the last year.   They do not  have excessive sun exposure. Discussed the need for sun protection: hats, long sleeves and use of sunscreen if there is significant sun exposure. Dermatologist - [facility])   Diet: the importance of a healthy diet is discussed. They do have a healthy diet.  The benefits of regular aerobic exercise were discussed. Exercises 3 x per week at [facility].  Depression screen: there are no signs or vegative symptoms of depression- irritability, change in appetite, anhedonia, sadness/tearfullness.  Cognitive assessment: the patient manages all their financial and personal affairs and is actively engaged. They could relate day,date,year and events.  HCPOA - Matan Steen, wife,  Then son, Calton Harshfield.  The following portions of the patient's history were reviewed and updated as appropriate: allergies, current medications,  past family history, past medical history,  past surgical history, past social history  and problem list.  Visual acuity was not assessed per patient preference since he has regular follow up with her ophthalmologist. Hearing and body mass index were assessed and reviewed.   During the course of the visit the patient was educated and counseled about appropriate screening and preventive services including : fall prevention , diabetes screening, nutrition counseling, colorectal cancer screening, and recommended immunizations.     Outpatient Encounter Prescriptions as of 07/29/2012  Medication Sig Dispense Refill  . aspirin 81 MG tablet Take 81 mg by mouth daily.        . carvedilol (COREG) 12.5 MG tablet Take 1 tablet (12.5 mg total) by mouth 2 (two) times daily with a meal.  180 tablet  3  . Cholecalciferol (VITAMIN D3) 2000 UNITS TABS Take 1 tablet by mouth daily.        . Cinnamon 500 MG capsule Take 500 mg by mouth daily.        . clopidogrel (PLAVIX) 75 MG tablet Take 1 tablet (75 mg total) by mouth daily.  90 tablet  4  . fesoterodine (TOVIAZ) 8 MG TB24 Take 1 tablet (8 mg total) by mouth daily.  90 tablet  3  . fexofenadine (ALLEGRA) 180 MG tablet Take 180 mg by mouth daily.      . fluticasone (FLONASE) 50 MCG/ACT nasal spray Place into the nose Daily.      Marland Kitchen glipiZIDE (GLUCOTROL) 5 MG tablet Take 1 tablet (5 mg total) by mouth daily.  90 tablet  3  . hydrochlorothiazide (HYDRODIURIL) 25 MG tablet Take 1 tablet (25 mg total) by mouth daily.  90 tablet  3  . metFORMIN (GLUCOPHAGE) 1000 MG tablet Take 1 tablet (1,000 mg total) by mouth 2 (two) times daily with a meal.  180 tablet  4  . Multiple Vitamin (MULTIVITAMIN) tablet Take 1 tablet by mouth daily.        . nitroGLYCERIN (NITROSTAT) 0.4 MG SL tablet Place 0.4 mg under the tongue every 5 (five) minutes as needed.      . Omega-3 Fatty Acids (FISH OIL) 1200 MG CAPS Take 1 capsule by mouth daily.        . quinapril (ACCUPRIL) 20 MG tablet  Take 1 tablet (20 mg total) by mouth 2 (two) times daily.  180 tablet  3  . rosuvastatin (CRESTOR) 20 MG tablet Take 1 tablet (20 mg total) by mouth daily.  90 tablet  4  . Saw Palmetto 450 MG CAPS Take 1 capsule by mouth 2 (two) times daily.        Marland Kitchen spironolactone (ALDACTONE) 50 MG tablet Take 1 tablet (50 mg total) by mouth daily.  90 tablet  3   BP 124/60  Pulse 60  Temp 97.7 F (36.5 C) (Oral)  Ht 5\' 8"  (1.727 m)  Wt 256 lb (116.121 kg)  BMI 38.92 kg/m2  SpO2 97%  Review of Systems  Constitutional: Negative for fever, chills, activity change, appetite change, fatigue and unexpected weight change.  Eyes: Negative for visual disturbance.  Respiratory: Negative for cough and shortness of breath.   Cardiovascular: Negative for chest pain, palpitations and leg swelling.  Gastrointestinal: Negative for abdominal pain and abdominal distention.  Genitourinary: Negative for dysuria, urgency and difficulty urinating.  Musculoskeletal: Negative for arthralgias and gait problem.  Skin: Negative for color change and rash.  Hematological: Negative for adenopathy.  Psychiatric/Behavioral: Negative for sleep disturbance and dysphoric mood. The patient is not nervous/anxious.        Objective:   Physical Exam  Constitutional: He is oriented to person, place, and time. He appears well-developed and well-nourished. No distress.  HENT:  Head: Normocephalic and atraumatic.  Right Ear: External ear normal.  Left Ear: External ear normal.  Nose: Nose normal.  Mouth/Throat: Oropharynx is clear and moist. No oropharyngeal exudate.  Eyes: Conjunctivae normal and EOM are normal. Pupils are equal, round, and reactive to light. Right eye exhibits no discharge. Left eye exhibits no discharge. No scleral icterus.  Neck: Normal range of motion. Neck supple. No tracheal deviation present. No thyromegaly present.  Cardiovascular: Normal rate and regular rhythm.  Exam reveals no gallop and no friction rub.    Murmur heard. Pulmonary/Chest: Effort normal and breath sounds normal. No respiratory distress. He has no wheezes. He has no rales. He exhibits no tenderness.  Abdominal: Soft. Bowel sounds are normal. He exhibits no distension. There is no tenderness.  Musculoskeletal: Normal range of motion. He exhibits no edema.  Lymphadenopathy:    He has no cervical adenopathy.  Neurological: He is alert and oriented to person, place, and time. No cranial nerve deficit. Coordination normal.  Skin: Skin is warm and dry. No rash noted. He is not diaphoretic. No erythema. No pallor.  Psychiatric: He has a normal mood and affect. His behavior is normal. Judgment and thought content normal.          Assessment & Plan:

## 2012-08-05 DIAGNOSIS — H11449 Conjunctival cysts, unspecified eye: Secondary | ICD-10-CM | POA: Diagnosis not present

## 2012-10-05 ENCOUNTER — Encounter: Payer: Self-pay | Admitting: Internal Medicine

## 2012-10-21 ENCOUNTER — Encounter: Payer: Self-pay | Admitting: Internal Medicine

## 2012-10-21 DIAGNOSIS — E785 Hyperlipidemia, unspecified: Secondary | ICD-10-CM

## 2012-10-21 MED ORDER — CLOPIDOGREL BISULFATE 75 MG PO TABS: 75.0000 mg | ORAL_TABLET | Freq: Every day | ORAL | Status: DC

## 2012-10-21 MED ORDER — ROSUVASTATIN CALCIUM 20 MG PO TABS: 20.0000 mg | ORAL_TABLET | Freq: Every day | ORAL | Status: DC

## 2012-10-21 MED ORDER — METFORMIN HCL 1000 MG PO TABS: 1000.0000 mg | ORAL_TABLET | Freq: Two times a day (BID) | ORAL | Status: DC

## 2012-11-02 ENCOUNTER — Ambulatory Visit: Payer: Medicare Other | Admitting: Internal Medicine

## 2012-11-11 ENCOUNTER — Encounter: Payer: Self-pay | Admitting: Internal Medicine

## 2012-11-11 ENCOUNTER — Telehealth: Payer: Self-pay | Admitting: Internal Medicine

## 2012-11-11 ENCOUNTER — Ambulatory Visit (INDEPENDENT_AMBULATORY_CARE_PROVIDER_SITE_OTHER): Payer: Medicare Other | Admitting: Internal Medicine

## 2012-11-11 VITALS — BP 120/68 | HR 74 | Temp 98.2°F | Wt 260.0 lb

## 2012-11-11 DIAGNOSIS — I10 Essential (primary) hypertension: Secondary | ICD-10-CM

## 2012-11-11 DIAGNOSIS — E119 Type 2 diabetes mellitus without complications: Secondary | ICD-10-CM | POA: Diagnosis not present

## 2012-11-11 DIAGNOSIS — E669 Obesity, unspecified: Secondary | ICD-10-CM

## 2012-11-11 DIAGNOSIS — E785 Hyperlipidemia, unspecified: Secondary | ICD-10-CM | POA: Diagnosis not present

## 2012-11-11 DIAGNOSIS — E1169 Type 2 diabetes mellitus with other specified complication: Secondary | ICD-10-CM

## 2012-11-11 NOTE — Assessment & Plan Note (Signed)
Lipids have been well-controlled on Crestor. Will continue.

## 2012-11-11 NOTE — Assessment & Plan Note (Signed)
  BP Readings from Last 3 Encounters:  11/11/12 120/68  07/29/12 124/60  04/21/12 130/82   Blood pressures have been well-controlled on current medications. Will continue. Followup 3 months.

## 2012-11-11 NOTE — Progress Notes (Signed)
Subjective:    Patient ID: Guy Phillips, male    DOB: 1940-12-04, 72 y.o.   MRN: [id number]  HPI 71YO male presents for follow up.  DM- BG well controlled, mostly 100-130s fasting. Notes some dietary indiscretion. Compliant with medications.  HTN - Does not check BP at home generally. Denies chest pain, palpitations, headache. Compliant with meds.   No new concerns today.  Outpatient Encounter Prescriptions as of 11/11/2012  Medication Sig Dispense Refill  . aspirin 81 MG tablet Take 81 mg by mouth daily.        . carvedilol (COREG) 12.5 MG tablet Take 1 tablet (12.5 mg total) by mouth 2 (two) times daily with a meal.  180 tablet  3  . Cholecalciferol (VITAMIN D3) 2000 UNITS TABS Take 1 tablet by mouth daily.        . Cinnamon 500 MG capsule Take 500 mg by mouth daily.        . clopidogrel (PLAVIX) 75 MG tablet Take 1 tablet (75 mg total) by mouth daily.  90 tablet  4  . fesoterodine (TOVIAZ) 8 MG TB24 Take 1 tablet (8 mg total) by mouth daily.  90 tablet  3  . fexofenadine (ALLEGRA) 180 MG tablet Take 180 mg by mouth daily.      . fluticasone (FLONASE) 50 MCG/ACT nasal spray Place into the nose Daily.      Marland Kitchen glipiZIDE (GLUCOTROL) 5 MG tablet Take 1 tablet (5 mg total) by mouth daily.  90 tablet  3  . hydrochlorothiazide (HYDRODIURIL) 25 MG tablet Take 1 tablet (25 mg total) by mouth daily.  90 tablet  3  . metFORMIN (GLUCOPHAGE) 1000 MG tablet Take 1 tablet (1,000 mg total) by mouth 2 (two) times daily with a meal.  180 tablet  4  . Multiple Vitamin (MULTIVITAMIN) tablet Take 1 tablet by mouth daily.        . nitroGLYCERIN (NITROSTAT) 0.4 MG SL tablet Place 0.4 mg under the tongue every 5 (five) minutes as needed.      . Omega-3 Fatty Acids (FISH OIL) 1200 MG CAPS Take 1 capsule by mouth daily.        . quinapril (ACCUPRIL) 20 MG tablet Take 1 tablet (20 mg total) by mouth 2 (two) times daily.  180 tablet  3  . rosuvastatin (CRESTOR) 20 MG tablet Take 1 tablet (20 mg total) by  mouth daily.  90 tablet  4  . Saw Palmetto 450 MG CAPS Take 1 capsule by mouth 2 (two) times daily.        Marland Kitchen spironolactone (ALDACTONE) 50 MG tablet Take 1 tablet (50 mg total) by mouth daily.  90 tablet  3   No facility-administered encounter medications on file as of 11/11/2012.   BP 120/68  Pulse 74  Temp(Src) 98.2 F (36.8 C) (Oral)  Wt 260 lb (117.935 kg)  BMI 39.54 kg/m2  SpO2 96%  Review of Systems  Constitutional: Negative for fever, chills, activity change, appetite change, fatigue and unexpected weight change.  Eyes: Negative for visual disturbance.  Respiratory: Negative for cough and shortness of breath.   Cardiovascular: Negative for chest pain, palpitations and leg swelling.  Gastrointestinal: Negative for abdominal pain and abdominal distention.  Genitourinary: Negative for dysuria, urgency and difficulty urinating.  Musculoskeletal: Negative for arthralgias and gait problem.  Skin: Negative for color change and rash.  Hematological: Negative for adenopathy.  Psychiatric/Behavioral: Negative for sleep disturbance and dysphoric mood. The patient is not nervous/anxious.  Objective:   Physical Exam  Constitutional: He is oriented to person, place, and time. He appears well-developed and well-nourished. No distress.  HENT:  Head: Normocephalic and atraumatic.  Right Ear: External ear normal.  Left Ear: External ear normal.  Nose: Nose normal.  Mouth/Throat: Oropharynx is clear and moist. No oropharyngeal exudate.  Eyes: Conjunctivae and EOM are normal. Pupils are equal, round, and reactive to light. Right eye exhibits no discharge. Left eye exhibits no discharge. No scleral icterus.  Neck: Normal range of motion. Neck supple. No tracheal deviation present. No thyromegaly present.  Cardiovascular: Normal rate and regular rhythm.  Exam reveals no gallop and no friction rub.   Murmur heard. Pulmonary/Chest: Effort normal and breath sounds normal. No respiratory  distress. He has no wheezes. He has no rales. He exhibits no tenderness.  Musculoskeletal: Normal range of motion. He exhibits no edema.  Lymphadenopathy:    He has no cervical adenopathy.  Neurological: He is alert and oriented to person, place, and time. No cranial nerve deficit. Coordination normal.  Skin: Skin is warm and dry. No rash noted. He is not diaphoretic. No erythema. No pallor.  Psychiatric: He has a normal mood and affect. His behavior is normal. Judgment and thought content normal.          Assessment & Plan:

## 2012-11-11 NOTE — Assessment & Plan Note (Signed)
Patient reports good control of blood sugars. Will check A1c with labs today. Continue current medications.

## 2012-11-12 LAB — COMPREHENSIVE METABOLIC PANEL
AST: 22 U/L (ref 0–37)
Alkaline Phosphatase: 54 U/L (ref 39–117)
BUN: 16 mg/dL (ref 6–23)
Calcium: 9.9 mg/dL (ref 8.4–10.5)
Creatinine, Ser: 1.3 mg/dL (ref 0.4–1.5)
Total Bilirubin: 1.2 mg/dL (ref 0.3–1.2)

## 2013-01-11 ENCOUNTER — Encounter: Payer: Self-pay | Admitting: Internal Medicine

## 2013-01-11 DIAGNOSIS — I10 Essential (primary) hypertension: Secondary | ICD-10-CM

## 2013-01-11 DIAGNOSIS — N3941 Urge incontinence: Secondary | ICD-10-CM

## 2013-01-11 NOTE — Telephone Encounter (Signed)
Pt requesting RFs, which I will do, but pt is asking to increase Glipizide 5 mg to 2 per day. Please advise.

## 2013-01-12 MED ORDER — METFORMIN HCL 1000 MG PO TABS: 1000.0000 mg | ORAL_TABLET | Freq: Two times a day (BID) | ORAL | Status: DC

## 2013-01-12 MED ORDER — SPIRONOLACTONE 50 MG PO TABS: 50.0000 mg | ORAL_TABLET | Freq: Every day | ORAL | Status: DC

## 2013-01-12 MED ORDER — GLIPIZIDE 5 MG PO TABS: 5.0000 mg | ORAL_TABLET | Freq: Two times a day (BID) | ORAL | Status: DC

## 2013-01-12 MED ORDER — CARVEDILOL 12.5 MG PO TABS: 12.5000 mg | ORAL_TABLET | Freq: Two times a day (BID) | ORAL | Status: DC

## 2013-01-12 MED ORDER — QUINAPRIL HCL 20 MG PO TABS: 20.0000 mg | ORAL_TABLET | Freq: Two times a day (BID) | ORAL | Status: DC

## 2013-01-12 MED ORDER — FESOTERODINE FUMARATE ER 8 MG PO TB24: 8.0000 mg | ORAL_TABLET | Freq: Every day | ORAL | Status: DC

## 2013-01-12 NOTE — Telephone Encounter (Signed)
Medications were escribed to mail order pharmacy

## 2013-01-13 DIAGNOSIS — I2789 Other specified pulmonary heart diseases: Secondary | ICD-10-CM | POA: Diagnosis not present

## 2013-01-13 DIAGNOSIS — R0609 Other forms of dyspnea: Secondary | ICD-10-CM | POA: Diagnosis not present

## 2013-01-13 DIAGNOSIS — I359 Nonrheumatic aortic valve disorder, unspecified: Secondary | ICD-10-CM | POA: Diagnosis not present

## 2013-01-13 DIAGNOSIS — I2510 Atherosclerotic heart disease of native coronary artery without angina pectoris: Secondary | ICD-10-CM | POA: Diagnosis not present

## 2013-02-08 DIAGNOSIS — IMO0002 Reserved for concepts with insufficient information to code with codable children: Secondary | ICD-10-CM | POA: Diagnosis not present

## 2013-02-08 DIAGNOSIS — M5030 Other cervical disc degeneration, unspecified cervical region: Secondary | ICD-10-CM | POA: Diagnosis not present

## 2013-02-08 DIAGNOSIS — M9981 Other biomechanical lesions of cervical region: Secondary | ICD-10-CM | POA: Diagnosis not present

## 2013-02-08 DIAGNOSIS — M999 Biomechanical lesion, unspecified: Secondary | ICD-10-CM | POA: Diagnosis not present

## 2013-02-08 DIAGNOSIS — M5430 Sciatica, unspecified side: Secondary | ICD-10-CM | POA: Diagnosis not present

## 2013-02-09 DIAGNOSIS — M5030 Other cervical disc degeneration, unspecified cervical region: Secondary | ICD-10-CM | POA: Diagnosis not present

## 2013-02-09 DIAGNOSIS — M999 Biomechanical lesion, unspecified: Secondary | ICD-10-CM | POA: Diagnosis not present

## 2013-02-09 DIAGNOSIS — M5430 Sciatica, unspecified side: Secondary | ICD-10-CM | POA: Diagnosis not present

## 2013-02-09 DIAGNOSIS — M9981 Other biomechanical lesions of cervical region: Secondary | ICD-10-CM | POA: Diagnosis not present

## 2013-02-09 DIAGNOSIS — IMO0002 Reserved for concepts with insufficient information to code with codable children: Secondary | ICD-10-CM | POA: Diagnosis not present

## 2013-02-10 ENCOUNTER — Encounter: Payer: Self-pay | Admitting: Internal Medicine

## 2013-02-10 ENCOUNTER — Ambulatory Visit (INDEPENDENT_AMBULATORY_CARE_PROVIDER_SITE_OTHER): Payer: Medicare Other | Admitting: Internal Medicine

## 2013-02-10 VITALS — BP 130/80 | HR 60 | Temp 98.4°F | Wt 255.0 lb

## 2013-02-10 DIAGNOSIS — I10 Essential (primary) hypertension: Secondary | ICD-10-CM | POA: Diagnosis not present

## 2013-02-10 DIAGNOSIS — E119 Type 2 diabetes mellitus without complications: Secondary | ICD-10-CM

## 2013-02-10 DIAGNOSIS — E1169 Type 2 diabetes mellitus with other specified complication: Secondary | ICD-10-CM

## 2013-02-10 DIAGNOSIS — E669 Obesity, unspecified: Secondary | ICD-10-CM | POA: Diagnosis not present

## 2013-02-10 DIAGNOSIS — I350 Nonrheumatic aortic (valve) stenosis: Secondary | ICD-10-CM

## 2013-02-10 DIAGNOSIS — J0100 Acute maxillary sinusitis, unspecified: Secondary | ICD-10-CM | POA: Diagnosis not present

## 2013-02-10 DIAGNOSIS — E785 Hyperlipidemia, unspecified: Secondary | ICD-10-CM | POA: Diagnosis not present

## 2013-02-10 DIAGNOSIS — I359 Nonrheumatic aortic valve disorder, unspecified: Secondary | ICD-10-CM

## 2013-02-10 LAB — COMPREHENSIVE METABOLIC PANEL
Alkaline Phosphatase: 51 U/L (ref 39–117)
BUN: 19 mg/dL (ref 6–23)
Glucose, Bld: 117 mg/dL — ABNORMAL HIGH (ref 70–99)
Sodium: 140 mEq/L (ref 135–145)
Total Bilirubin: 0.9 mg/dL (ref 0.3–1.2)
Total Protein: 6.8 g/dL (ref 6.0–8.3)

## 2013-02-10 LAB — HEMOGLOBIN A1C: Hgb A1c MFr Bld: 7 % — ABNORMAL HIGH (ref 4.6–6.5)

## 2013-02-10 MED ORDER — AMOXICILLIN-POT CLAVULANATE 875-125 MG PO TABS: 1.0000 | ORAL_TABLET | Freq: Two times a day (BID) | ORAL | Status: DC

## 2013-02-10 NOTE — Assessment & Plan Note (Signed)
BP Readings from Last 3 Encounters:  02/10/13 130/80  11/11/12 120/68  07/29/12 124/60   BP well controlled on current medications. Will continue.

## 2013-02-10 NOTE — Progress Notes (Signed)
Subjective:    Patient ID: Guy Phillips, male    DOB: [birth year], 72 y.o.   MRN: [id number]  HPI 72 year old male with history of diabetes, hypertension, obesity, aortic stenosis, hyperlipidemia presents for followup. In regards to chronic medical conditions, he reports things are going well. He reports blood sugars have been improved compared to previous with typical blood sugars less than 150. He is compliant with medications. He was recently seen at [facility] and had echocardiogram which showed stable aortic stenosis. BNP at that time was normal.  He is concerned today about two-week history of nasal congestion, purulent nasal drainage, sinus pressure and pain. He denies any fever or chills. He has been taking some over-the-counter cough and cold preparations with no improvement.  Outpatient Encounter Prescriptions as of 02/10/2013  Medication Sig Dispense Refill  . aspirin 81 MG tablet Take 81 mg by mouth daily.        . carvedilol (COREG) 12.5 MG tablet Take 1 tablet (12.5 mg total) by mouth 2 (two) times daily with a meal.  180 tablet  3  . Cholecalciferol (VITAMIN D3) 2000 UNITS TABS Take 1 tablet by mouth daily.        . Cinnamon 500 MG capsule Take 500 mg by mouth daily.        . clopidogrel (PLAVIX) 75 MG tablet Take 1 tablet (75 mg total) by mouth daily.  90 tablet  4  . fesoterodine (TOVIAZ) 8 MG TB24 Take 1 tablet (8 mg total) by mouth daily.  90 tablet  3  . fexofenadine (ALLEGRA) 180 MG tablet Take 180 mg by mouth daily.      . fluticasone (FLONASE) 50 MCG/ACT nasal spray Place into the nose Daily.      Marland Kitchen glipiZIDE (GLUCOTROL) 5 MG tablet Take 1 tablet (5 mg total) by mouth 2 (two) times daily before a meal.  180 tablet  3  . hydrochlorothiazide (HYDRODIURIL) 25 MG tablet Take 1 tablet (25 mg total) by mouth daily.  90 tablet  3  . metFORMIN (GLUCOPHAGE) 1000 MG tablet Take 1 tablet (1,000 mg total) by mouth 2 (two) times daily with a meal.  180 tablet  4  . Multiple  Vitamin (MULTIVITAMIN) tablet Take 1 tablet by mouth daily.        . nitroGLYCERIN (NITROSTAT) 0.4 MG SL tablet Place 0.4 mg under the tongue every 5 (five) minutes as needed.      . Omega-3 Fatty Acids (FISH OIL) 1200 MG CAPS Take 1 capsule by mouth daily.        . quinapril (ACCUPRIL) 20 MG tablet Take 1 tablet (20 mg total) by mouth 2 (two) times daily.  180 tablet  3  . rosuvastatin (CRESTOR) 20 MG tablet Take 1 tablet (20 mg total) by mouth daily.  90 tablet  4  . Saw Palmetto 450 MG CAPS Take 1 capsule by mouth 2 (two) times daily.        Marland Kitchen spironolactone (ALDACTONE) 50 MG tablet Take 1 tablet (50 mg total) by mouth daily.  90 tablet  3  . amoxicillin-clavulanate (AUGMENTIN) 875-125 MG per tablet Take 1 tablet by mouth 2 (two) times daily.  20 tablet  0  . [DISCONTINUED] amoxicillin-clavulanate (AUGMENTIN) 875-125 MG per tablet Take 1 tablet by mouth 2 (two) times daily.  20 tablet  0   No facility-administered encounter medications on file as of 02/10/2013.   BP 130/80  Pulse 60  Temp(Src) 98.4 F (36.9 C) (Oral)  Wt 255  lb (115.667 kg)  BMI 38.78 kg/m2  SpO2 97%  Review of Systems  Constitutional: Negative for fever, chills, activity change, appetite change, fatigue and unexpected weight change.  HENT: Positive for congestion and sinus pressure. Negative for rhinorrhea and postnasal drip.   Eyes: Negative for visual disturbance.  Respiratory: Negative for cough and shortness of breath.   Cardiovascular: Negative for chest pain, palpitations and leg swelling.  Gastrointestinal: Negative for abdominal pain and abdominal distention.  Genitourinary: Negative for dysuria, urgency and difficulty urinating.  Musculoskeletal: Negative for arthralgias and gait problem.  Skin: Negative for color change and rash.  Hematological: Negative for adenopathy.  Psychiatric/Behavioral: Negative for sleep disturbance and dysphoric mood. The patient is not nervous/anxious.        Objective:    Physical Exam  Constitutional: He is oriented to person, place, and time. He appears well-developed and well-nourished. No distress.  HENT:  Head: Normocephalic and atraumatic.  Right Ear: External ear normal. A middle ear effusion is present.  Left Ear: External ear normal. A middle ear effusion is present.  Nose: Right sinus exhibits maxillary sinus tenderness. Left sinus exhibits maxillary sinus tenderness.  Mouth/Throat: Oropharynx is clear and moist. No oropharyngeal exudate.  Eyes: Conjunctivae and EOM are normal. Pupils are equal, round, and reactive to light. Right eye exhibits no discharge. Left eye exhibits no discharge. No scleral icterus.  Neck: Normal range of motion. Neck supple. No tracheal deviation present. No thyromegaly present.  Cardiovascular: Normal rate and regular rhythm.  Exam reveals no gallop and no friction rub.   Murmur heard.  Systolic murmur is present  Pulmonary/Chest: Effort normal and breath sounds normal. No respiratory distress. He has no wheezes. He has no rales. He exhibits no tenderness.  Musculoskeletal: Normal range of motion. He exhibits no edema.  Lymphadenopathy:    He has no cervical adenopathy.  Neurological: He is alert and oriented to person, place, and time. No cranial nerve deficit. Coordination normal.  Skin: Skin is warm and dry. No rash noted. He is not diaphoretic. No erythema. No pallor.  Psychiatric: He has a normal mood and affect. His behavior is normal. Judgment and thought content normal.          Assessment & Plan:

## 2013-02-10 NOTE — Assessment & Plan Note (Signed)
Symptoms and exam consistent with acute maxillary sinusitis. Will treat with Augmentin. Pt will call if symptoms not improving over next 48hr.

## 2013-02-10 NOTE — Assessment & Plan Note (Signed)
Reviewed recent ECHO from [facility] through [place]. Unchanged from previous. BNP improved. Will continue to monitor.

## 2013-02-10 NOTE — Assessment & Plan Note (Signed)
Will check A1c with labs today. Continue current medications. Follow up 3 months and prn.

## 2013-02-10 NOTE — Assessment & Plan Note (Signed)
Lab Results  Component Value Date   LDLCALC 84 07/29/2012   Lipids well controlled on Rosuvastatin. Will continue.

## 2013-02-11 DIAGNOSIS — IMO0002 Reserved for concepts with insufficient information to code with codable children: Secondary | ICD-10-CM | POA: Diagnosis not present

## 2013-02-11 DIAGNOSIS — M999 Biomechanical lesion, unspecified: Secondary | ICD-10-CM | POA: Diagnosis not present

## 2013-02-11 DIAGNOSIS — M9981 Other biomechanical lesions of cervical region: Secondary | ICD-10-CM | POA: Diagnosis not present

## 2013-02-11 DIAGNOSIS — M5430 Sciatica, unspecified side: Secondary | ICD-10-CM | POA: Diagnosis not present

## 2013-02-11 DIAGNOSIS — M5030 Other cervical disc degeneration, unspecified cervical region: Secondary | ICD-10-CM | POA: Diagnosis not present

## 2013-02-26 DIAGNOSIS — M5430 Sciatica, unspecified side: Secondary | ICD-10-CM | POA: Diagnosis not present

## 2013-02-26 DIAGNOSIS — IMO0002 Reserved for concepts with insufficient information to code with codable children: Secondary | ICD-10-CM | POA: Diagnosis not present

## 2013-02-26 DIAGNOSIS — M5030 Other cervical disc degeneration, unspecified cervical region: Secondary | ICD-10-CM | POA: Diagnosis not present

## 2013-02-26 DIAGNOSIS — M999 Biomechanical lesion, unspecified: Secondary | ICD-10-CM | POA: Diagnosis not present

## 2013-02-26 DIAGNOSIS — M9981 Other biomechanical lesions of cervical region: Secondary | ICD-10-CM | POA: Diagnosis not present

## 2013-03-01 DIAGNOSIS — M9981 Other biomechanical lesions of cervical region: Secondary | ICD-10-CM | POA: Diagnosis not present

## 2013-03-01 DIAGNOSIS — M5430 Sciatica, unspecified side: Secondary | ICD-10-CM | POA: Diagnosis not present

## 2013-03-01 DIAGNOSIS — IMO0002 Reserved for concepts with insufficient information to code with codable children: Secondary | ICD-10-CM | POA: Diagnosis not present

## 2013-03-01 DIAGNOSIS — M5030 Other cervical disc degeneration, unspecified cervical region: Secondary | ICD-10-CM | POA: Diagnosis not present

## 2013-03-01 DIAGNOSIS — M999 Biomechanical lesion, unspecified: Secondary | ICD-10-CM | POA: Diagnosis not present

## 2013-03-03 DIAGNOSIS — IMO0002 Reserved for concepts with insufficient information to code with codable children: Secondary | ICD-10-CM | POA: Diagnosis not present

## 2013-03-03 DIAGNOSIS — M9981 Other biomechanical lesions of cervical region: Secondary | ICD-10-CM | POA: Diagnosis not present

## 2013-03-03 DIAGNOSIS — M5030 Other cervical disc degeneration, unspecified cervical region: Secondary | ICD-10-CM | POA: Diagnosis not present

## 2013-03-03 DIAGNOSIS — M999 Biomechanical lesion, unspecified: Secondary | ICD-10-CM | POA: Diagnosis not present

## 2013-03-03 DIAGNOSIS — M5430 Sciatica, unspecified side: Secondary | ICD-10-CM | POA: Diagnosis not present

## 2013-03-04 DIAGNOSIS — M9981 Other biomechanical lesions of cervical region: Secondary | ICD-10-CM | POA: Diagnosis not present

## 2013-03-04 DIAGNOSIS — M5030 Other cervical disc degeneration, unspecified cervical region: Secondary | ICD-10-CM | POA: Diagnosis not present

## 2013-03-04 DIAGNOSIS — IMO0002 Reserved for concepts with insufficient information to code with codable children: Secondary | ICD-10-CM | POA: Diagnosis not present

## 2013-03-04 DIAGNOSIS — M999 Biomechanical lesion, unspecified: Secondary | ICD-10-CM | POA: Diagnosis not present

## 2013-03-04 DIAGNOSIS — M5430 Sciatica, unspecified side: Secondary | ICD-10-CM | POA: Diagnosis not present

## 2013-03-08 DIAGNOSIS — IMO0002 Reserved for concepts with insufficient information to code with codable children: Secondary | ICD-10-CM | POA: Diagnosis not present

## 2013-03-08 DIAGNOSIS — M999 Biomechanical lesion, unspecified: Secondary | ICD-10-CM | POA: Diagnosis not present

## 2013-03-08 DIAGNOSIS — M9981 Other biomechanical lesions of cervical region: Secondary | ICD-10-CM | POA: Diagnosis not present

## 2013-03-08 DIAGNOSIS — M5430 Sciatica, unspecified side: Secondary | ICD-10-CM | POA: Diagnosis not present

## 2013-03-08 DIAGNOSIS — M5030 Other cervical disc degeneration, unspecified cervical region: Secondary | ICD-10-CM | POA: Diagnosis not present

## 2013-03-10 DIAGNOSIS — M5430 Sciatica, unspecified side: Secondary | ICD-10-CM | POA: Diagnosis not present

## 2013-03-10 DIAGNOSIS — M9981 Other biomechanical lesions of cervical region: Secondary | ICD-10-CM | POA: Diagnosis not present

## 2013-03-10 DIAGNOSIS — IMO0002 Reserved for concepts with insufficient information to code with codable children: Secondary | ICD-10-CM | POA: Diagnosis not present

## 2013-03-10 DIAGNOSIS — M5030 Other cervical disc degeneration, unspecified cervical region: Secondary | ICD-10-CM | POA: Diagnosis not present

## 2013-03-10 DIAGNOSIS — M999 Biomechanical lesion, unspecified: Secondary | ICD-10-CM | POA: Diagnosis not present

## 2013-03-11 DIAGNOSIS — M5430 Sciatica, unspecified side: Secondary | ICD-10-CM | POA: Diagnosis not present

## 2013-03-11 DIAGNOSIS — M999 Biomechanical lesion, unspecified: Secondary | ICD-10-CM | POA: Diagnosis not present

## 2013-03-11 DIAGNOSIS — IMO0002 Reserved for concepts with insufficient information to code with codable children: Secondary | ICD-10-CM | POA: Diagnosis not present

## 2013-03-11 DIAGNOSIS — M5030 Other cervical disc degeneration, unspecified cervical region: Secondary | ICD-10-CM | POA: Diagnosis not present

## 2013-03-11 DIAGNOSIS — M9981 Other biomechanical lesions of cervical region: Secondary | ICD-10-CM | POA: Diagnosis not present

## 2013-03-15 DIAGNOSIS — M5430 Sciatica, unspecified side: Secondary | ICD-10-CM | POA: Diagnosis not present

## 2013-03-15 DIAGNOSIS — M5030 Other cervical disc degeneration, unspecified cervical region: Secondary | ICD-10-CM | POA: Diagnosis not present

## 2013-03-15 DIAGNOSIS — M9981 Other biomechanical lesions of cervical region: Secondary | ICD-10-CM | POA: Diagnosis not present

## 2013-03-15 DIAGNOSIS — IMO0002 Reserved for concepts with insufficient information to code with codable children: Secondary | ICD-10-CM | POA: Diagnosis not present

## 2013-03-15 DIAGNOSIS — M999 Biomechanical lesion, unspecified: Secondary | ICD-10-CM | POA: Diagnosis not present

## 2013-03-17 DIAGNOSIS — M5030 Other cervical disc degeneration, unspecified cervical region: Secondary | ICD-10-CM | POA: Diagnosis not present

## 2013-03-17 DIAGNOSIS — IMO0002 Reserved for concepts with insufficient information to code with codable children: Secondary | ICD-10-CM | POA: Diagnosis not present

## 2013-03-17 DIAGNOSIS — M5430 Sciatica, unspecified side: Secondary | ICD-10-CM | POA: Diagnosis not present

## 2013-03-17 DIAGNOSIS — M999 Biomechanical lesion, unspecified: Secondary | ICD-10-CM | POA: Diagnosis not present

## 2013-03-17 DIAGNOSIS — M9981 Other biomechanical lesions of cervical region: Secondary | ICD-10-CM | POA: Diagnosis not present

## 2013-03-18 DIAGNOSIS — M9981 Other biomechanical lesions of cervical region: Secondary | ICD-10-CM | POA: Diagnosis not present

## 2013-03-18 DIAGNOSIS — M5430 Sciatica, unspecified side: Secondary | ICD-10-CM | POA: Diagnosis not present

## 2013-03-18 DIAGNOSIS — M5030 Other cervical disc degeneration, unspecified cervical region: Secondary | ICD-10-CM | POA: Diagnosis not present

## 2013-03-18 DIAGNOSIS — IMO0002 Reserved for concepts with insufficient information to code with codable children: Secondary | ICD-10-CM | POA: Diagnosis not present

## 2013-03-18 DIAGNOSIS — M999 Biomechanical lesion, unspecified: Secondary | ICD-10-CM | POA: Diagnosis not present

## 2013-03-23 DIAGNOSIS — M999 Biomechanical lesion, unspecified: Secondary | ICD-10-CM | POA: Diagnosis not present

## 2013-03-23 DIAGNOSIS — M5412 Radiculopathy, cervical region: Secondary | ICD-10-CM | POA: Diagnosis not present

## 2013-03-23 DIAGNOSIS — M5137 Other intervertebral disc degeneration, lumbosacral region: Secondary | ICD-10-CM | POA: Diagnosis not present

## 2013-03-23 DIAGNOSIS — M9981 Other biomechanical lesions of cervical region: Secondary | ICD-10-CM | POA: Diagnosis not present

## 2013-03-25 DIAGNOSIS — M62838 Other muscle spasm: Secondary | ICD-10-CM | POA: Diagnosis not present

## 2013-03-25 DIAGNOSIS — M5412 Radiculopathy, cervical region: Secondary | ICD-10-CM | POA: Diagnosis not present

## 2013-03-25 DIAGNOSIS — M999 Biomechanical lesion, unspecified: Secondary | ICD-10-CM | POA: Diagnosis not present

## 2013-03-25 DIAGNOSIS — M9981 Other biomechanical lesions of cervical region: Secondary | ICD-10-CM | POA: Diagnosis not present

## 2013-03-30 DIAGNOSIS — M999 Biomechanical lesion, unspecified: Secondary | ICD-10-CM | POA: Diagnosis not present

## 2013-03-30 DIAGNOSIS — M5412 Radiculopathy, cervical region: Secondary | ICD-10-CM | POA: Diagnosis not present

## 2013-03-30 DIAGNOSIS — M9981 Other biomechanical lesions of cervical region: Secondary | ICD-10-CM | POA: Diagnosis not present

## 2013-03-30 DIAGNOSIS — D485 Neoplasm of uncertain behavior of skin: Secondary | ICD-10-CM | POA: Diagnosis not present

## 2013-03-30 DIAGNOSIS — L723 Sebaceous cyst: Secondary | ICD-10-CM | POA: Diagnosis not present

## 2013-03-30 DIAGNOSIS — M62838 Other muscle spasm: Secondary | ICD-10-CM | POA: Diagnosis not present

## 2013-03-30 DIAGNOSIS — L570 Actinic keratosis: Secondary | ICD-10-CM | POA: Diagnosis not present

## 2013-04-01 DIAGNOSIS — M9981 Other biomechanical lesions of cervical region: Secondary | ICD-10-CM | POA: Diagnosis not present

## 2013-04-01 DIAGNOSIS — M62838 Other muscle spasm: Secondary | ICD-10-CM | POA: Diagnosis not present

## 2013-04-01 DIAGNOSIS — M5412 Radiculopathy, cervical region: Secondary | ICD-10-CM | POA: Diagnosis not present

## 2013-04-01 DIAGNOSIS — M999 Biomechanical lesion, unspecified: Secondary | ICD-10-CM | POA: Diagnosis not present

## 2013-04-07 DIAGNOSIS — M9981 Other biomechanical lesions of cervical region: Secondary | ICD-10-CM | POA: Diagnosis not present

## 2013-04-07 DIAGNOSIS — M5412 Radiculopathy, cervical region: Secondary | ICD-10-CM | POA: Diagnosis not present

## 2013-04-07 DIAGNOSIS — M999 Biomechanical lesion, unspecified: Secondary | ICD-10-CM | POA: Diagnosis not present

## 2013-04-07 DIAGNOSIS — M62838 Other muscle spasm: Secondary | ICD-10-CM | POA: Diagnosis not present

## 2013-04-14 DIAGNOSIS — M62838 Other muscle spasm: Secondary | ICD-10-CM | POA: Diagnosis not present

## 2013-04-14 DIAGNOSIS — M9981 Other biomechanical lesions of cervical region: Secondary | ICD-10-CM | POA: Diagnosis not present

## 2013-04-14 DIAGNOSIS — M5412 Radiculopathy, cervical region: Secondary | ICD-10-CM | POA: Diagnosis not present

## 2013-04-14 DIAGNOSIS — M999 Biomechanical lesion, unspecified: Secondary | ICD-10-CM | POA: Diagnosis not present

## 2013-04-21 DIAGNOSIS — M9981 Other biomechanical lesions of cervical region: Secondary | ICD-10-CM | POA: Diagnosis not present

## 2013-04-21 DIAGNOSIS — M62838 Other muscle spasm: Secondary | ICD-10-CM | POA: Diagnosis not present

## 2013-04-21 DIAGNOSIS — M5412 Radiculopathy, cervical region: Secondary | ICD-10-CM | POA: Diagnosis not present

## 2013-04-21 DIAGNOSIS — M999 Biomechanical lesion, unspecified: Secondary | ICD-10-CM | POA: Diagnosis not present

## 2013-04-28 DIAGNOSIS — M9981 Other biomechanical lesions of cervical region: Secondary | ICD-10-CM | POA: Diagnosis not present

## 2013-04-28 DIAGNOSIS — M999 Biomechanical lesion, unspecified: Secondary | ICD-10-CM | POA: Diagnosis not present

## 2013-04-28 DIAGNOSIS — M5412 Radiculopathy, cervical region: Secondary | ICD-10-CM | POA: Diagnosis not present

## 2013-04-28 DIAGNOSIS — M62838 Other muscle spasm: Secondary | ICD-10-CM | POA: Diagnosis not present

## 2013-05-04 ENCOUNTER — Other Ambulatory Visit: Payer: Self-pay | Admitting: Internal Medicine

## 2013-05-07 DIAGNOSIS — D044 Carcinoma in situ of skin of scalp and neck: Secondary | ICD-10-CM | POA: Diagnosis not present

## 2013-05-12 DIAGNOSIS — M999 Biomechanical lesion, unspecified: Secondary | ICD-10-CM | POA: Diagnosis not present

## 2013-05-12 DIAGNOSIS — M5412 Radiculopathy, cervical region: Secondary | ICD-10-CM | POA: Diagnosis not present

## 2013-05-12 DIAGNOSIS — M62838 Other muscle spasm: Secondary | ICD-10-CM | POA: Diagnosis not present

## 2013-05-12 DIAGNOSIS — M9981 Other biomechanical lesions of cervical region: Secondary | ICD-10-CM | POA: Diagnosis not present

## 2013-05-14 ENCOUNTER — Ambulatory Visit: Payer: Medicare Other | Admitting: Internal Medicine

## 2013-05-21 ENCOUNTER — Ambulatory Visit (INDEPENDENT_AMBULATORY_CARE_PROVIDER_SITE_OTHER): Payer: Medicare Other | Admitting: Internal Medicine

## 2013-05-21 ENCOUNTER — Encounter: Payer: Self-pay | Admitting: Internal Medicine

## 2013-05-21 VITALS — BP 130/70 | HR 58 | Temp 98.4°F | Wt 250.0 lb

## 2013-05-21 DIAGNOSIS — E1169 Type 2 diabetes mellitus with other specified complication: Secondary | ICD-10-CM

## 2013-05-21 DIAGNOSIS — I10 Essential (primary) hypertension: Secondary | ICD-10-CM | POA: Diagnosis not present

## 2013-05-21 DIAGNOSIS — E669 Obesity, unspecified: Secondary | ICD-10-CM | POA: Diagnosis not present

## 2013-05-21 DIAGNOSIS — E785 Hyperlipidemia, unspecified: Secondary | ICD-10-CM | POA: Diagnosis not present

## 2013-05-21 DIAGNOSIS — E119 Type 2 diabetes mellitus without complications: Secondary | ICD-10-CM

## 2013-05-21 LAB — COMPREHENSIVE METABOLIC PANEL
ALT: 16 U/L (ref 0–53)
AST: 16 U/L (ref 0–37)
Albumin: 4.1 g/dL (ref 3.5–5.2)
Alkaline Phosphatase: 50 U/L (ref 39–117)
BUN: 19 mg/dL (ref 6–23)
Calcium: 9.8 mg/dL (ref 8.4–10.5)
Chloride: 105 mEq/L (ref 96–112)
Potassium: 4.6 mEq/L (ref 3.5–5.1)

## 2013-05-21 LAB — LIPID PANEL
Cholesterol: 151 mg/dL (ref 0–200)
LDL Cholesterol: 80 mg/dL (ref 0–99)
Total CHOL/HDL Ratio: 4
Triglycerides: 164 mg/dL — ABNORMAL HIGH (ref 0.0–149.0)
VLDL: 32.8 mg/dL (ref 0.0–40.0)

## 2013-05-21 LAB — MICROALBUMIN / CREATININE URINE RATIO: Microalb Creat Ratio: 0.4 mg/g (ref 0.0–30.0)

## 2013-05-21 NOTE — Progress Notes (Signed)
Subjective:    Patient ID: Guy Phillips, male    DOB: 1941-02-09, 72 y.o.   MRN: [id number]  HPI 72 year old male with history of obesity, diabetes, hypertension, hyperlipidemia, aortic stenosis presents for followup. He reports he is generally feeling well. Recent blood sugars have been less than 100 fasting. He has not had any low blood sugars less than 70. He denies any blood sugars greater than 200. He is compliant with medications. He tries to follow a healthy diet. He is not participating in any regular exercise program. He denies any recent chest pain or shortness of breath.  Outpatient Encounter Prescriptions as of 05/21/2013  Medication Sig Dispense Refill  . aspirin 81 MG tablet Take 81 mg by mouth daily.        . carvedilol (COREG) 12.5 MG tablet Take 1 tablet (12.5 mg total) by mouth 2 (two) times daily with a meal.  180 tablet  3  . Cholecalciferol (VITAMIN D3) 2000 UNITS TABS Take 1 tablet by mouth daily.        . Cinnamon 500 MG capsule Take 500 mg by mouth daily.        . clopidogrel (PLAVIX) 75 MG tablet Take 1 tablet (75 mg total) by mouth daily.  90 tablet  4  . fesoterodine (TOVIAZ) 8 MG TB24 Take 1 tablet (8 mg total) by mouth daily.  90 tablet  3  . fexofenadine (ALLEGRA) 180 MG tablet Take 180 mg by mouth daily.      . fluticasone (FLONASE) 50 MCG/ACT nasal spray Place into the nose Daily.      Marland Kitchen glipiZIDE (GLUCOTROL) 5 MG tablet Take 1 tablet (5 mg total) by mouth 2 (two) times daily before a meal.  180 tablet  3  . hydrochlorothiazide (HYDRODIURIL) 25 MG tablet TAKE 1 TABLET DAILY  90 tablet  3  . metFORMIN (GLUCOPHAGE) 1000 MG tablet Take 1 tablet (1,000 mg total) by mouth 2 (two) times daily with a meal.  180 tablet  4  . Multiple Vitamin (MULTIVITAMIN) tablet Take 1 tablet by mouth daily.        . nitroGLYCERIN (NITROSTAT) 0.4 MG SL tablet Place 0.4 mg under the tongue every 5 (five) minutes as needed.      . Omega-3 Fatty Acids (FISH OIL) 1200 MG CAPS Take 1  capsule by mouth daily.        . quinapril (ACCUPRIL) 20 MG tablet Take 1 tablet (20 mg total) by mouth 2 (two) times daily.  180 tablet  3  . rosuvastatin (CRESTOR) 20 MG tablet Take 1 tablet (20 mg total) by mouth daily.  90 tablet  4  . Saw Palmetto 450 MG CAPS Take 1 capsule by mouth 2 (two) times daily.        Marland Kitchen spironolactone (ALDACTONE) 50 MG tablet Take 1 tablet (50 mg total) by mouth daily.  90 tablet  3  . [DISCONTINUED] amoxicillin-clavulanate (AUGMENTIN) 875-125 MG per tablet Take 1 tablet by mouth 2 (two) times daily.  20 tablet  0   No facility-administered encounter medications on file as of 05/21/2013.   BP 130/70  Pulse 58  Temp(Src) 98.4 F (36.9 C) (Oral)  Wt 250 lb (113.399 kg)  BMI 38.02 kg/m2  SpO2 96%   Review of Systems  Constitutional: Negative for fever, chills, activity change, appetite change, fatigue and unexpected weight change.  Eyes: Negative for visual disturbance.  Respiratory: Negative for cough and shortness of breath.   Cardiovascular: Negative for chest pain, palpitations  and leg swelling.  Gastrointestinal: Negative for abdominal pain and abdominal distention.  Genitourinary: Negative for dysuria, urgency and difficulty urinating.  Musculoskeletal: Negative for arthralgias and gait problem.  Skin: Negative for color change and rash.  Hematological: Negative for adenopathy.  Psychiatric/Behavioral: Negative for sleep disturbance and dysphoric mood. The patient is not nervous/anxious.        Objective:   Physical Exam  Constitutional: He is oriented to person, place, and time. He appears well-developed and well-nourished. No distress.  HENT:  Head: Normocephalic and atraumatic.  Right Ear: External ear normal.  Left Ear: External ear normal.  Nose: Nose normal.  Mouth/Throat: Oropharynx is clear and moist. No oropharyngeal exudate.  Eyes: Conjunctivae and EOM are normal. Pupils are equal, round, and reactive to light. Right eye exhibits no  discharge. Left eye exhibits no discharge. No scleral icterus.  Neck: Normal range of motion. Neck supple. No tracheal deviation present. No thyromegaly present.  Cardiovascular: Normal rate and regular rhythm.  Exam reveals no gallop and no friction rub.   Murmur heard. Pulmonary/Chest: Effort normal and breath sounds normal. No respiratory distress. He has no wheezes. He has no rales. He exhibits no tenderness.  Musculoskeletal: Normal range of motion. He exhibits no edema.  Lymphadenopathy:    He has no cervical adenopathy.  Neurological: He is alert and oriented to person, place, and time. No cranial nerve deficit. Coordination normal.  Skin: Skin is warm and dry. No rash noted. He is not diaphoretic. No erythema. No pallor.  Psychiatric: He has a normal mood and affect. His behavior is normal. Judgment and thought content normal.          Assessment & Plan:

## 2013-05-21 NOTE — Assessment & Plan Note (Signed)
Will check lipids and LFTs with labs today. Continue Crestor. 

## 2013-05-21 NOTE — Assessment & Plan Note (Signed)
Wt Readings from Last 3 Encounters:  05/21/13 250 lb (113.399 kg)  02/10/13 255 lb (115.667 kg)  11/11/12 260 lb (117.935 kg)   Encouraged continued efforts at healthy diet and regular physical activity with goal of 30 minutes 5 days per week of aerobic activity.

## 2013-05-21 NOTE — Assessment & Plan Note (Addendum)
Patient reports excellent control of blood sugars. Will check A1c with labs today. Continue current medication. Encouraged efforts at healthy diet and regular physical activity. Followup 3 months. Foot exam normal today.

## 2013-05-21 NOTE — Assessment & Plan Note (Signed)
BP Readings from Last 3 Encounters:  05/21/13 130/70  02/10/13 130/80  11/11/12 120/68   Blood pressure well-controlled on current medications. Will continue.

## 2013-06-09 DIAGNOSIS — M9981 Other biomechanical lesions of cervical region: Secondary | ICD-10-CM | POA: Diagnosis not present

## 2013-06-09 DIAGNOSIS — M999 Biomechanical lesion, unspecified: Secondary | ICD-10-CM | POA: Diagnosis not present

## 2013-06-09 DIAGNOSIS — M62838 Other muscle spasm: Secondary | ICD-10-CM | POA: Diagnosis not present

## 2013-06-09 DIAGNOSIS — M5412 Radiculopathy, cervical region: Secondary | ICD-10-CM | POA: Diagnosis not present

## 2013-06-16 ENCOUNTER — Ambulatory Visit (INDEPENDENT_AMBULATORY_CARE_PROVIDER_SITE_OTHER): Payer: Medicare Other | Admitting: *Deleted

## 2013-06-16 DIAGNOSIS — Z23 Encounter for immunization: Secondary | ICD-10-CM

## 2013-07-13 DIAGNOSIS — H40009 Preglaucoma, unspecified, unspecified eye: Secondary | ICD-10-CM | POA: Diagnosis not present

## 2013-07-13 DIAGNOSIS — H2510 Age-related nuclear cataract, unspecified eye: Secondary | ICD-10-CM | POA: Diagnosis not present

## 2013-07-19 DIAGNOSIS — M999 Biomechanical lesion, unspecified: Secondary | ICD-10-CM | POA: Diagnosis not present

## 2013-07-19 DIAGNOSIS — M9981 Other biomechanical lesions of cervical region: Secondary | ICD-10-CM | POA: Diagnosis not present

## 2013-07-19 DIAGNOSIS — M62838 Other muscle spasm: Secondary | ICD-10-CM | POA: Diagnosis not present

## 2013-07-19 DIAGNOSIS — M5412 Radiculopathy, cervical region: Secondary | ICD-10-CM | POA: Diagnosis not present

## 2013-07-21 DIAGNOSIS — I2789 Other specified pulmonary heart diseases: Secondary | ICD-10-CM | POA: Diagnosis not present

## 2013-07-21 DIAGNOSIS — I359 Nonrheumatic aortic valve disorder, unspecified: Secondary | ICD-10-CM | POA: Diagnosis not present

## 2013-07-21 DIAGNOSIS — R0609 Other forms of dyspnea: Secondary | ICD-10-CM | POA: Diagnosis not present

## 2013-07-21 DIAGNOSIS — I10 Essential (primary) hypertension: Secondary | ICD-10-CM | POA: Diagnosis not present

## 2013-07-21 DIAGNOSIS — I2510 Atherosclerotic heart disease of native coronary artery without angina pectoris: Secondary | ICD-10-CM | POA: Diagnosis not present

## 2013-08-25 ENCOUNTER — Ambulatory Visit: Payer: Medicare Other | Admitting: Internal Medicine

## 2013-08-30 ENCOUNTER — Ambulatory Visit: Payer: Medicare Other | Admitting: Internal Medicine

## 2013-09-06 DIAGNOSIS — L570 Actinic keratosis: Secondary | ICD-10-CM | POA: Diagnosis not present

## 2013-09-06 DIAGNOSIS — Z85828 Personal history of other malignant neoplasm of skin: Secondary | ICD-10-CM | POA: Diagnosis not present

## 2013-09-21 ENCOUNTER — Ambulatory Visit: Payer: Medicare Other | Admitting: Internal Medicine

## 2013-09-23 HISTORY — PX: CORONARY ARTERY BYPASS GRAFT: SHX141

## 2013-09-27 ENCOUNTER — Encounter: Payer: Self-pay | Admitting: Internal Medicine

## 2013-09-27 ENCOUNTER — Ambulatory Visit (INDEPENDENT_AMBULATORY_CARE_PROVIDER_SITE_OTHER): Payer: Medicare Other | Admitting: Internal Medicine

## 2013-09-27 VITALS — BP 120/60 | HR 83 | Temp 98.2°F | Wt 255.0 lb

## 2013-09-27 DIAGNOSIS — E669 Obesity, unspecified: Secondary | ICD-10-CM

## 2013-09-27 DIAGNOSIS — E1169 Type 2 diabetes mellitus with other specified complication: Secondary | ICD-10-CM

## 2013-09-27 DIAGNOSIS — I359 Nonrheumatic aortic valve disorder, unspecified: Secondary | ICD-10-CM

## 2013-09-27 DIAGNOSIS — E785 Hyperlipidemia, unspecified: Secondary | ICD-10-CM

## 2013-09-27 DIAGNOSIS — E119 Type 2 diabetes mellitus without complications: Secondary | ICD-10-CM

## 2013-09-27 DIAGNOSIS — I10 Essential (primary) hypertension: Secondary | ICD-10-CM

## 2013-09-27 DIAGNOSIS — I2510 Atherosclerotic heart disease of native coronary artery without angina pectoris: Secondary | ICD-10-CM | POA: Diagnosis not present

## 2013-09-27 DIAGNOSIS — I350 Nonrheumatic aortic (valve) stenosis: Secondary | ICD-10-CM

## 2013-09-27 LAB — HEMOGLOBIN A1C: Hgb A1c MFr Bld: 7 % — ABNORMAL HIGH (ref 4.6–6.5)

## 2013-09-27 LAB — COMPREHENSIVE METABOLIC PANEL
ALBUMIN: 4.4 g/dL (ref 3.5–5.2)
ALK PHOS: 57 U/L (ref 39–117)
ALT: 18 U/L (ref 0–53)
AST: 19 U/L (ref 0–37)
BUN: 20 mg/dL (ref 6–23)
CO2: 29 mEq/L (ref 19–32)
Calcium: 9.8 mg/dL (ref 8.4–10.5)
Chloride: 102 mEq/L (ref 96–112)
Creatinine, Ser: 1.3 mg/dL (ref 0.4–1.5)
GFR: 58.85 mL/min — ABNORMAL LOW (ref >60.00)
GLUCOSE: 136 mg/dL — AB (ref 70–99)
POTASSIUM: 4.6 meq/L (ref 3.5–5.1)
SODIUM: 139 meq/L (ref 135–145)
TOTAL PROTEIN: 6.9 g/dL (ref 6.0–8.3)
Total Bilirubin: 1 mg/dL (ref 0.3–1.2)

## 2013-09-27 LAB — HM DIABETES FOOT EXAM: HM Diabetic Foot Exam: NORMAL

## 2013-09-27 LAB — HM DIABETES EYE EXAM

## 2013-09-27 NOTE — Assessment & Plan Note (Signed)
Reviewed notes from patient's cardiologist. He is scheduled for repeat cardiac echo in April 2015. Currently asymptomatic. Will follow.

## 2013-09-27 NOTE — Assessment & Plan Note (Signed)
Lab Results  Component Value Date   LDLCALC 80 05/21/2013   Lipids well controlled on Crestor. Samples given today.

## 2013-09-27 NOTE — Assessment & Plan Note (Signed)
Currently asymptomatic. On statin, BB, ACEi, and aspirin. Will continue.

## 2013-09-27 NOTE — Assessment & Plan Note (Signed)
Lab Results  Component Value Date   HGBA1C 7.0* 09/27/2013   BG well controlled on current medications. Encouraged efforts at healthy diet and regular physical activity. Continue glipizide and metformin.

## 2013-09-27 NOTE — Assessment & Plan Note (Signed)
BP Readings from Last 3 Encounters:  09/27/13 120/60  05/21/13 130/70  02/10/13 130/80   BP well controlled on current medication. Will check renal function with labs today. Will continue current medications.

## 2013-09-27 NOTE — Patient Instructions (Signed)
We will check A1c today.  Follow up 3 months or sooner as needed.

## 2013-09-27 NOTE — Progress Notes (Signed)
Pre-visit discussion using our clinic review tool. No additional management support is needed unless otherwise documented below in the visit note.  

## 2013-09-27 NOTE — Progress Notes (Signed)
Subjective:    Patient ID: Guy Phillips, male    DOB: 09-30-1940, 73 y.o.   MRN: [id number]  HPI 73 year old male with history of aortic stenosis, diabetes, hypertension, hyperlipidemia, obesity presents for followup. He reports that he is generally feeling well. No new concerns today. He did not bring record of his blood sugars. He reports compliance with his medications. He denies any shortness of breath or chest pain. He has been walking three quarters of a mile 3 times per week without incident.  Outpatient Prescriptions Prior to Visit  Medication Sig Dispense Refill  . aspirin 81 MG tablet Take 81 mg by mouth daily.        . carvedilol (COREG) 12.5 MG tablet Take 1 tablet (12.5 mg total) by mouth 2 (two) times daily with a meal.  180 tablet  3  . Cholecalciferol (VITAMIN D3) 2000 UNITS TABS Take 1 tablet by mouth daily.        . Cinnamon 500 MG capsule Take 500 mg by mouth daily.        . fesoterodine (TOVIAZ) 8 MG TB24 Take 1 tablet (8 mg total) by mouth daily.  90 tablet  3  . fexofenadine (ALLEGRA) 180 MG tablet Take 180 mg by mouth daily.      . fluticasone (FLONASE) 50 MCG/ACT nasal spray Place into the nose Daily.      Marland Kitchen glipiZIDE (GLUCOTROL) 5 MG tablet Take 1 tablet (5 mg total) by mouth 2 (two) times daily before a meal.  180 tablet  3  . hydrochlorothiazide (HYDRODIURIL) 25 MG tablet TAKE 1 TABLET DAILY  90 tablet  3  . metFORMIN (GLUCOPHAGE) 1000 MG tablet Take 1 tablet (1,000 mg total) by mouth 2 (two) times daily with a meal.  180 tablet  4  . Multiple Vitamin (MULTIVITAMIN) tablet Take 1 tablet by mouth daily.        . nitroGLYCERIN (NITROSTAT) 0.4 MG SL tablet Place 0.4 mg under the tongue every 5 (five) minutes as needed.      . Omega-3 Fatty Acids (FISH OIL) 1200 MG CAPS Take 1 capsule by mouth daily.        . quinapril (ACCUPRIL) 20 MG tablet Take 1 tablet (20 mg total) by mouth 2 (two) times daily.  180 tablet  3  . rosuvastatin (CRESTOR) 20 MG tablet Take 1  tablet (20 mg total) by mouth daily.  90 tablet  4  . spironolactone (ALDACTONE) 50 MG tablet Take 1 tablet (50 mg total) by mouth daily.  90 tablet  3  . clopidogrel (PLAVIX) 75 MG tablet Take 1 tablet (75 mg total) by mouth daily.  90 tablet  4  . Saw Palmetto 450 MG CAPS Take 1 capsule by mouth 2 (two) times daily.         No facility-administered medications prior to visit.   BP 120/60  Pulse 83  Temp(Src) 98.2 F (36.8 C) (Oral)  Wt 255 lb (115.667 kg)  SpO2 97%  Review of Systems  Constitutional: Negative for fever, chills, activity change, appetite change, fatigue and unexpected weight change.  Eyes: Negative for visual disturbance.  Respiratory: Negative for cough and shortness of breath.   Cardiovascular: Negative for chest pain, palpitations and leg swelling.  Gastrointestinal: Negative for abdominal pain and abdominal distention.  Genitourinary: Negative for dysuria, urgency and difficulty urinating.  Musculoskeletal: Negative for arthralgias and gait problem.  Skin: Negative for color change and rash.  Hematological: Negative for adenopathy.  Psychiatric/Behavioral: Negative for  sleep disturbance and dysphoric mood. The patient is not nervous/anxious.        Objective:   Physical Exam  Constitutional: He is oriented to person, place, and time. He appears well-developed and well-nourished. No distress.  HENT:  Head: Normocephalic and atraumatic.  Right Ear: External ear normal.  Left Ear: External ear normal.  Nose: Nose normal.  Mouth/Throat: Oropharynx is clear and moist. No oropharyngeal exudate.  Eyes: Conjunctivae and EOM are normal. Pupils are equal, round, and reactive to light. Right eye exhibits no discharge. Left eye exhibits no discharge. No scleral icterus.  Neck: Normal range of motion. Neck supple. No tracheal deviation present. No thyromegaly present.  Cardiovascular: Normal rate, regular rhythm and normal heart sounds.  Exam reveals no gallop and no  friction rub.   No murmur heard. Pulmonary/Chest: Effort normal and breath sounds normal. No respiratory distress. He has no wheezes. He has no rales. He exhibits no tenderness.  Musculoskeletal: Normal range of motion. He exhibits no edema.  Lymphadenopathy:    He has no cervical adenopathy.  Neurological: He is alert and oriented to person, place, and time. No cranial nerve deficit. Coordination normal.  Skin: Skin is warm and dry. No rash noted. He is not diaphoretic. No erythema. No pallor.  Psychiatric: He has a normal mood and affect. His behavior is normal. Judgment and thought content normal.          Assessment & Plan:

## 2013-09-28 DIAGNOSIS — H43819 Vitreous degeneration, unspecified eye: Secondary | ICD-10-CM | POA: Diagnosis not present

## 2013-09-28 DIAGNOSIS — H40009 Preglaucoma, unspecified, unspecified eye: Secondary | ICD-10-CM | POA: Diagnosis not present

## 2013-10-04 DIAGNOSIS — M5412 Radiculopathy, cervical region: Secondary | ICD-10-CM | POA: Diagnosis not present

## 2013-10-04 DIAGNOSIS — M62838 Other muscle spasm: Secondary | ICD-10-CM | POA: Diagnosis not present

## 2013-10-04 DIAGNOSIS — M999 Biomechanical lesion, unspecified: Secondary | ICD-10-CM | POA: Diagnosis not present

## 2013-10-04 DIAGNOSIS — M9981 Other biomechanical lesions of cervical region: Secondary | ICD-10-CM | POA: Diagnosis not present

## 2013-11-15 DIAGNOSIS — M5412 Radiculopathy, cervical region: Secondary | ICD-10-CM | POA: Diagnosis not present

## 2013-11-15 DIAGNOSIS — M62838 Other muscle spasm: Secondary | ICD-10-CM | POA: Diagnosis not present

## 2013-11-15 DIAGNOSIS — M999 Biomechanical lesion, unspecified: Secondary | ICD-10-CM | POA: Diagnosis not present

## 2013-11-15 DIAGNOSIS — M9981 Other biomechanical lesions of cervical region: Secondary | ICD-10-CM | POA: Diagnosis not present

## 2013-11-18 ENCOUNTER — Other Ambulatory Visit: Payer: Self-pay | Admitting: Internal Medicine

## 2013-11-24 ENCOUNTER — Other Ambulatory Visit: Payer: Self-pay | Admitting: Internal Medicine

## 2013-12-20 DIAGNOSIS — M999 Biomechanical lesion, unspecified: Secondary | ICD-10-CM | POA: Diagnosis not present

## 2013-12-20 DIAGNOSIS — M5412 Radiculopathy, cervical region: Secondary | ICD-10-CM | POA: Diagnosis not present

## 2013-12-20 DIAGNOSIS — M62838 Other muscle spasm: Secondary | ICD-10-CM | POA: Diagnosis not present

## 2013-12-20 DIAGNOSIS — M9981 Other biomechanical lesions of cervical region: Secondary | ICD-10-CM | POA: Diagnosis not present

## 2013-12-28 ENCOUNTER — Other Ambulatory Visit: Payer: Self-pay | Admitting: Internal Medicine

## 2013-12-28 ENCOUNTER — Encounter: Payer: Self-pay | Admitting: Internal Medicine

## 2013-12-28 ENCOUNTER — Ambulatory Visit (INDEPENDENT_AMBULATORY_CARE_PROVIDER_SITE_OTHER): Payer: Medicare Other | Admitting: Internal Medicine

## 2013-12-28 VITALS — BP 130/70 | HR 62 | Temp 98.2°F | Wt 251.0 lb

## 2013-12-28 DIAGNOSIS — E785 Hyperlipidemia, unspecified: Secondary | ICD-10-CM

## 2013-12-28 DIAGNOSIS — I350 Nonrheumatic aortic (valve) stenosis: Secondary | ICD-10-CM

## 2013-12-28 DIAGNOSIS — I359 Nonrheumatic aortic valve disorder, unspecified: Secondary | ICD-10-CM

## 2013-12-28 DIAGNOSIS — Z23 Encounter for immunization: Secondary | ICD-10-CM | POA: Diagnosis not present

## 2013-12-28 DIAGNOSIS — E119 Type 2 diabetes mellitus without complications: Secondary | ICD-10-CM

## 2013-12-28 DIAGNOSIS — M25552 Pain in left hip: Secondary | ICD-10-CM

## 2013-12-28 DIAGNOSIS — M25559 Pain in unspecified hip: Secondary | ICD-10-CM

## 2013-12-28 DIAGNOSIS — E669 Obesity, unspecified: Secondary | ICD-10-CM

## 2013-12-28 DIAGNOSIS — I2510 Atherosclerotic heart disease of native coronary artery without angina pectoris: Secondary | ICD-10-CM

## 2013-12-28 DIAGNOSIS — E1169 Type 2 diabetes mellitus with other specified complication: Secondary | ICD-10-CM

## 2013-12-28 DIAGNOSIS — I10 Essential (primary) hypertension: Secondary | ICD-10-CM

## 2013-12-28 LAB — COMPREHENSIVE METABOLIC PANEL
ALT: 17 U/L (ref 0–53)
AST: 16 U/L (ref 0–37)
Albumin: 4.1 g/dL (ref 3.5–5.2)
Alkaline Phosphatase: 54 U/L (ref 39–117)
BILIRUBIN TOTAL: 1.2 mg/dL (ref 0.3–1.2)
BUN: 18 mg/dL (ref 6–23)
CO2: 25 meq/L (ref 19–32)
Calcium: 9.8 mg/dL (ref 8.4–10.5)
Chloride: 102 mEq/L (ref 96–112)
Creatinine, Ser: 1.1 mg/dL (ref 0.4–1.5)
GFR: 71.54 mL/min (ref >60.00)
Glucose, Bld: 95 mg/dL (ref 70–99)
Potassium: 4.7 mEq/L (ref 3.5–5.1)
SODIUM: 136 meq/L (ref 135–145)
TOTAL PROTEIN: 6.9 g/dL (ref 6.0–8.3)

## 2013-12-28 LAB — MICROALBUMIN / CREATININE URINE RATIO
CREATININE, U: 117.2 mg/dL
MICROALB UR: 0.4 mg/dL (ref 0.0–1.9)
MICROALB/CREAT RATIO: 0.3 mg/g (ref 0.0–30.0)

## 2013-12-28 LAB — LIPID PANEL
Cholesterol: 144 mg/dL (ref 0–200)
HDL: 37.9 mg/dL — AB (ref >39.00)
LDL Cholesterol: 74 mg/dL (ref 0–99)
Total CHOL/HDL Ratio: 4
Triglycerides: 163 mg/dL — ABNORMAL HIGH (ref 0.0–149.0)
VLDL: 32.6 mg/dL (ref 0.0–40.0)

## 2013-12-28 LAB — HEMOGLOBIN A1C: HEMOGLOBIN A1C: 6.9 % — AB (ref 4.6–6.5)

## 2013-12-28 MED ORDER — ROSUVASTATIN CALCIUM 20 MG PO TABS: 20.0000 mg | ORAL_TABLET | Freq: Every day | ORAL | Status: DC

## 2013-12-28 MED ORDER — CARVEDILOL 12.5 MG PO TABS: 12.5000 mg | ORAL_TABLET | Freq: Two times a day (BID) | ORAL | Status: DC

## 2013-12-28 NOTE — Progress Notes (Signed)
Pre visit review using our clinic review tool, if applicable. No additional management support is needed unless otherwise documented below in the visit note. 

## 2013-12-28 NOTE — Assessment & Plan Note (Signed)
Will check A1c with labs today. Continue Metformin and Glipizide. 

## 2013-12-28 NOTE — Assessment & Plan Note (Signed)
Will check lipids with labs today. Continue Crestor.

## 2013-12-28 NOTE — Assessment & Plan Note (Signed)
Asymptomatic. Exam unchanged. Follow up at [facility] as scheduled 02/2014.

## 2013-12-28 NOTE — Assessment & Plan Note (Signed)
Symptoms most consistent with OA. Exam normal today. Will set up orthopedics evaluation. Tylenol prn pain in the interim. Follow up 3 months and prn.

## 2013-12-28 NOTE — Assessment & Plan Note (Signed)
BP Readings from Last 3 Encounters:  12/28/13 130/70  09/27/13 120/60  05/21/13 130/70   BP well controlled on current medications. Will continue.

## 2013-12-28 NOTE — Assessment & Plan Note (Signed)
Currently asymptomatic. On statin, BB, ACEi, and aspirin. Will continue.

## 2013-12-28 NOTE — Progress Notes (Signed)
Subjective:    Patient ID: Guy Phillips, male    DOB: 08-16-1941, 74 y.o.   MRN: [id number]  HPI 73YO male presents for follow up.  DM - BG running 100-135. Compliant with medication.  Left hip pain - Several months. Aching, "like a toothach", worse with physical activity such as walking. No trauma to hip. Not taking any medication for it.  Otherwise, feeling well.   Review of Systems  Constitutional: Negative for fever, chills, activity change, appetite change, fatigue and unexpected weight change.  Eyes: Negative for visual disturbance.  Respiratory: Negative for cough and shortness of breath.   Cardiovascular: Negative for chest pain, palpitations and leg swelling.  Gastrointestinal: Negative for abdominal pain and abdominal distention.  Genitourinary: Negative for dysuria, urgency and difficulty urinating.  Musculoskeletal: Positive for arthralgias (left hip). Negative for gait problem.  Skin: Negative for color change and rash.  Hematological: Negative for adenopathy.  Psychiatric/Behavioral: Negative for sleep disturbance and dysphoric mood. The patient is not nervous/anxious.        Objective:    BP 130/70  Pulse 62  Temp(Src) 98.2 F (36.8 C) (Oral)  Wt 251 lb (113.853 kg)  SpO2 96% Physical Exam  Constitutional: He is oriented to person, place, and time. He appears well-developed and well-nourished. No distress.  HENT:  Head: Normocephalic and atraumatic.  Right Ear: External ear normal.  Left Ear: External ear normal.  Nose: Nose normal.  Mouth/Throat: Oropharynx is clear and moist. No oropharyngeal exudate.  Eyes: Conjunctivae and EOM are normal. Pupils are equal, round, and reactive to light. Right eye exhibits no discharge. Left eye exhibits no discharge. No scleral icterus.  Neck: Normal range of motion. Neck supple. No tracheal deviation present. No thyromegaly present.  Cardiovascular: Normal rate and regular rhythm.  Exam reveals no gallop and no  friction rub.   Murmur heard.  Systolic murmur is present  Pulmonary/Chest: Effort normal and breath sounds normal. No accessory muscle usage. Not tachypneic. No respiratory distress. He has no decreased breath sounds. He has no wheezes. He has no rhonchi. He has no rales. He exhibits no tenderness.  Musculoskeletal: He exhibits no edema.       Left hip: He exhibits normal range of motion, normal strength, no tenderness and no bony tenderness.       Legs: Lymphadenopathy:    He has no cervical adenopathy.  Neurological: He is alert and oriented to person, place, and time. No cranial nerve deficit. Coordination normal.  Skin: Skin is warm and dry. No rash noted. He is not diaphoretic. No erythema. No pallor.  Psychiatric: He has a normal mood and affect. His behavior is normal. Judgment and thought content normal.          Assessment & Plan:   Problem List Items Addressed This Visit   Aortic stenosis     Asymptomatic. Exam unchanged. Follow up at [facility] as scheduled 02/2014.    Relevant Medications      rosuvastatin (CRESTOR) tablet      carvedilol (COREG) tablet   CAD (coronary artery disease)     Currently asymptomatic. On statin, BB, ACEi, and aspirin. Will continue.      Diabetes mellitus type 2 in obese - Primary     Will check A1c with labs today. Continue Metformin and Glipizide.    Relevant Orders      Comprehensive metabolic panel      Hemoglobin A1c      Lipid panel  Microalbumin / creatinine urine ratio   Hyperlipidemia     Will check lipids with labs today. Continue Crestor.    Hypertension      BP Readings from Last 3 Encounters:  12/28/13 130/70  09/27/13 120/60  05/21/13 130/70   BP well controlled on current medications. Will continue.    Left hip pain     Symptoms most consistent with OA. Exam normal today. Will set up orthopedics evaluation. Tylenol prn pain in the interim. Follow up 3 months and prn.    Relevant Orders      Ambulatory  referral to Orthopedic Surgery    Other Visit Diagnoses   Need for prophylactic vaccination against Streptococcus pneumoniae (pneumococcus)        Relevant Orders       Pneumococcal conjugate vaccine 13-valent (Completed)        Return in about 3 months (around 03/29/2014) for Recheck of Diabetes.

## 2013-12-29 ENCOUNTER — Telehealth: Payer: Self-pay | Admitting: Internal Medicine

## 2013-12-29 NOTE — Telephone Encounter (Signed)
Relevant patient education assigned to patient using Emmi. ° °

## 2014-01-11 ENCOUNTER — Telehealth: Payer: Self-pay

## 2014-01-11 NOTE — Telephone Encounter (Signed)
Relevant patient education assigned to patient using Emmi. ° °

## 2014-01-17 DIAGNOSIS — M999 Biomechanical lesion, unspecified: Secondary | ICD-10-CM | POA: Diagnosis not present

## 2014-01-17 DIAGNOSIS — M62838 Other muscle spasm: Secondary | ICD-10-CM | POA: Diagnosis not present

## 2014-01-17 DIAGNOSIS — M9981 Other biomechanical lesions of cervical region: Secondary | ICD-10-CM | POA: Diagnosis not present

## 2014-01-17 DIAGNOSIS — M5412 Radiculopathy, cervical region: Secondary | ICD-10-CM | POA: Diagnosis not present

## 2014-01-19 DIAGNOSIS — I10 Essential (primary) hypertension: Secondary | ICD-10-CM | POA: Diagnosis not present

## 2014-01-19 DIAGNOSIS — M948X9 Other specified disorders of cartilage, unspecified sites: Secondary | ICD-10-CM | POA: Diagnosis not present

## 2014-01-19 DIAGNOSIS — E119 Type 2 diabetes mellitus without complications: Secondary | ICD-10-CM | POA: Diagnosis not present

## 2014-01-19 DIAGNOSIS — E785 Hyperlipidemia, unspecified: Secondary | ICD-10-CM | POA: Diagnosis not present

## 2014-01-19 DIAGNOSIS — M25559 Pain in unspecified hip: Secondary | ICD-10-CM | POA: Diagnosis not present

## 2014-02-01 DIAGNOSIS — M25559 Pain in unspecified hip: Secondary | ICD-10-CM | POA: Diagnosis not present

## 2014-02-02 DIAGNOSIS — I10 Essential (primary) hypertension: Secondary | ICD-10-CM | POA: Diagnosis not present

## 2014-02-02 DIAGNOSIS — R0989 Other specified symptoms and signs involving the circulatory and respiratory systems: Secondary | ICD-10-CM | POA: Diagnosis not present

## 2014-02-02 DIAGNOSIS — I359 Nonrheumatic aortic valve disorder, unspecified: Secondary | ICD-10-CM | POA: Diagnosis not present

## 2014-02-02 DIAGNOSIS — E785 Hyperlipidemia, unspecified: Secondary | ICD-10-CM | POA: Diagnosis not present

## 2014-02-02 DIAGNOSIS — I2510 Atherosclerotic heart disease of native coronary artery without angina pectoris: Secondary | ICD-10-CM | POA: Diagnosis not present

## 2014-02-02 DIAGNOSIS — I2584 Coronary atherosclerosis due to calcified coronary lesion: Secondary | ICD-10-CM | POA: Diagnosis not present

## 2014-02-02 DIAGNOSIS — R0609 Other forms of dyspnea: Secondary | ICD-10-CM | POA: Diagnosis not present

## 2014-02-02 DIAGNOSIS — I2789 Other specified pulmonary heart diseases: Secondary | ICD-10-CM | POA: Diagnosis not present

## 2014-02-04 DIAGNOSIS — M25559 Pain in unspecified hip: Secondary | ICD-10-CM | POA: Diagnosis not present

## 2014-02-08 DIAGNOSIS — M25559 Pain in unspecified hip: Secondary | ICD-10-CM | POA: Diagnosis not present

## 2014-02-11 DIAGNOSIS — M25559 Pain in unspecified hip: Secondary | ICD-10-CM | POA: Diagnosis not present

## 2014-02-15 DIAGNOSIS — M25559 Pain in unspecified hip: Secondary | ICD-10-CM | POA: Diagnosis not present

## 2014-02-18 DIAGNOSIS — M25559 Pain in unspecified hip: Secondary | ICD-10-CM | POA: Diagnosis not present

## 2014-02-21 DIAGNOSIS — M25559 Pain in unspecified hip: Secondary | ICD-10-CM | POA: Diagnosis not present

## 2014-02-21 DIAGNOSIS — M5412 Radiculopathy, cervical region: Secondary | ICD-10-CM | POA: Diagnosis not present

## 2014-02-21 DIAGNOSIS — M9981 Other biomechanical lesions of cervical region: Secondary | ICD-10-CM | POA: Diagnosis not present

## 2014-02-21 DIAGNOSIS — M999 Biomechanical lesion, unspecified: Secondary | ICD-10-CM | POA: Diagnosis not present

## 2014-02-21 DIAGNOSIS — M62838 Other muscle spasm: Secondary | ICD-10-CM | POA: Diagnosis not present

## 2014-02-22 DIAGNOSIS — M25559 Pain in unspecified hip: Secondary | ICD-10-CM | POA: Diagnosis not present

## 2014-02-25 DIAGNOSIS — M25559 Pain in unspecified hip: Secondary | ICD-10-CM | POA: Diagnosis not present

## 2014-03-04 DIAGNOSIS — M25559 Pain in unspecified hip: Secondary | ICD-10-CM | POA: Diagnosis not present

## 2014-03-23 ENCOUNTER — Other Ambulatory Visit: Payer: Self-pay | Admitting: Internal Medicine

## 2014-03-28 DIAGNOSIS — M5412 Radiculopathy, cervical region: Secondary | ICD-10-CM | POA: Diagnosis not present

## 2014-03-28 DIAGNOSIS — M999 Biomechanical lesion, unspecified: Secondary | ICD-10-CM | POA: Diagnosis not present

## 2014-03-28 DIAGNOSIS — M62838 Other muscle spasm: Secondary | ICD-10-CM | POA: Diagnosis not present

## 2014-03-28 DIAGNOSIS — M9981 Other biomechanical lesions of cervical region: Secondary | ICD-10-CM | POA: Diagnosis not present

## 2014-03-31 ENCOUNTER — Encounter: Payer: Self-pay | Admitting: Internal Medicine

## 2014-03-31 ENCOUNTER — Other Ambulatory Visit: Payer: Self-pay | Admitting: *Deleted

## 2014-03-31 ENCOUNTER — Ambulatory Visit (INDEPENDENT_AMBULATORY_CARE_PROVIDER_SITE_OTHER): Payer: Medicare Other | Admitting: Internal Medicine

## 2014-03-31 ENCOUNTER — Other Ambulatory Visit: Payer: Self-pay | Admitting: Internal Medicine

## 2014-03-31 VITALS — BP 118/60 | HR 71 | Temp 98.2°F | Ht 68.0 in | Wt 250.0 lb

## 2014-03-31 DIAGNOSIS — E669 Obesity, unspecified: Secondary | ICD-10-CM | POA: Diagnosis not present

## 2014-03-31 DIAGNOSIS — M25559 Pain in unspecified hip: Secondary | ICD-10-CM

## 2014-03-31 DIAGNOSIS — E1169 Type 2 diabetes mellitus with other specified complication: Secondary | ICD-10-CM

## 2014-03-31 DIAGNOSIS — E119 Type 2 diabetes mellitus without complications: Secondary | ICD-10-CM | POA: Diagnosis not present

## 2014-03-31 DIAGNOSIS — I359 Nonrheumatic aortic valve disorder, unspecified: Secondary | ICD-10-CM

## 2014-03-31 DIAGNOSIS — I2510 Atherosclerotic heart disease of native coronary artery without angina pectoris: Secondary | ICD-10-CM

## 2014-03-31 DIAGNOSIS — I10 Essential (primary) hypertension: Secondary | ICD-10-CM

## 2014-03-31 DIAGNOSIS — M25552 Pain in left hip: Secondary | ICD-10-CM

## 2014-03-31 DIAGNOSIS — E785 Hyperlipidemia, unspecified: Secondary | ICD-10-CM | POA: Diagnosis not present

## 2014-03-31 DIAGNOSIS — I350 Nonrheumatic aortic (valve) stenosis: Secondary | ICD-10-CM

## 2014-03-31 MED ORDER — HYDROCHLOROTHIAZIDE 25 MG PO TABS: 25.0000 mg | ORAL_TABLET | Freq: Every day | ORAL | Status: DC

## 2014-03-31 NOTE — Assessment & Plan Note (Signed)
Will check A1c with labs today. Continue current medications. Foot exam normal today.

## 2014-03-31 NOTE — Assessment & Plan Note (Signed)
BP Readings from Last 3 Encounters:  03/31/14 118/60  12/28/13 130/70  09/27/13 120/60   BP well controlled on current medications. Will check renal function with labs.

## 2014-03-31 NOTE — Assessment & Plan Note (Signed)
Reviewed notes from [facility]. Pt has follow up with [facility] to discuss possible aortic valve replacement and CABG. Symptomatically doing well. Continue current medications. Recheck renal function today.

## 2014-03-31 NOTE — Progress Notes (Signed)
Pre visit review using our clinic review tool, if applicable. No additional management support is needed unless otherwise documented below in the visit note. 

## 2014-03-31 NOTE — Progress Notes (Signed)
Subjective:    Patient ID: Guy Phillips, male    DOB: 1941/04/26, 73 y.o.   MRN: [id number]  HPI 73YO male presents for followup.  Just got back from [place]. Blood sugars have been more elevated. Did not bring record today. Compliant with meds.  No chest pain. No shortness of breath. Feeling well. Planning to meet with his cardiologist on Aug 13th to discuss possible CABG and Aortic valve replacement. He would like to have this surgery "while he is feeling well" rather than wait.   Review of Systems  Constitutional: Negative for fever, chills, activity change, appetite change, fatigue and unexpected weight change.  Eyes: Negative for visual disturbance.  Respiratory: Negative for cough and shortness of breath.   Cardiovascular: Negative for chest pain, palpitations and leg swelling.  Gastrointestinal: Negative for abdominal pain and abdominal distention.  Genitourinary: Negative for dysuria, urgency and difficulty urinating.  Musculoskeletal: Negative for arthralgias and gait problem.  Skin: Negative for color change and rash.  Hematological: Negative for adenopathy.  Psychiatric/Behavioral: Negative for sleep disturbance and dysphoric mood. The patient is not nervous/anxious.        Objective:    BP 118/60  Pulse 71  Temp(Src) 98.2 F (36.8 C) (Oral)  Ht 5\' 8"  (1.727 m)  Wt 250 lb (113.399 kg)  BMI 38.02 kg/m2  SpO2 96% Physical Exam  Constitutional: He is oriented to person, place, and time. He appears well-developed and well-nourished. No distress.  HENT:  Head: Normocephalic and atraumatic.  Right Ear: External ear normal.  Left Ear: External ear normal.  Nose: Nose normal.  Mouth/Throat: Oropharynx is clear and moist. No oropharyngeal exudate.  Eyes: Conjunctivae and EOM are normal. Pupils are equal, round, and reactive to light. Right eye exhibits no discharge. Left eye exhibits no discharge. No scleral icterus.  Neck: Normal range of motion. Neck supple.  No tracheal deviation present. No thyromegaly present.  Cardiovascular: Normal rate and regular rhythm.  Exam reveals no gallop and no friction rub.   Murmur heard. Pulmonary/Chest: Effort normal and breath sounds normal. No accessory muscle usage. Not tachypneic. No respiratory distress. He has no decreased breath sounds. He has no wheezes. He has no rhonchi. He has no rales. He exhibits no tenderness.  Musculoskeletal: Normal range of motion. He exhibits no edema.  Lymphadenopathy:    He has no cervical adenopathy.  Neurological: He is alert and oriented to person, place, and time. No cranial nerve deficit. Coordination normal.  Skin: Skin is warm and dry. No rash noted. He is not diaphoretic. No erythema. No pallor.  Psychiatric: He has a normal mood and affect. His behavior is normal. Judgment and thought content normal.          Assessment & Plan:   Problem List Items Addressed This Visit     Unprioritized   Aortic stenosis     Reviewed notes from [facility]. Pt has follow up with [facility] to discuss possible aortic valve replacement and CABG. Symptomatically doing well. Continue current medications. Recheck renal function today.    Relevant Medications      spironolactone (ALDACTONE) 25 MG tablet      hydrochlorothiazide tablet   Diabetes mellitus type 2 in obese - Primary     Will check A1c with labs today. Continue current medications. Foot exam normal today.    Relevant Orders      Comprehensive metabolic panel      Hemoglobin A1c      Lipid panel  Microalbumin / creatinine urine ratio   Hyperlipidemia     Will check lipids and LFTs with labs today. Continue Crestor.    Relevant Medications      spironolactone (ALDACTONE) 25 MG tablet      hydrochlorothiazide tablet   Hypertension      BP Readings from Last 3 Encounters:  03/31/14 118/60  12/28/13 130/70  09/27/13 120/60   BP well controlled on current medications. Will check renal function  with labs.    Relevant Medications      spironolactone (ALDACTONE) 25 MG tablet      hydrochlorothiazide tablet   Left hip pain     Secondary to bursitis. Reviewed notes from ortho [facility] today. Will continue PT.    Obesity (BMI 30-39.9)      Wt Readings from Last 3 Encounters:  03/31/14 250 lb (113.399 kg)  12/28/13 251 lb (113.853 kg)  09/27/13 255 lb (115.667 kg)   Body mass index is 38.02 kg/(m^2).  Encouraged better compliance with healthy diet and exercise with goal of weight loss.        Return in about 3 months (around 07/01/2014) for Recheck of Diabetes.

## 2014-03-31 NOTE — Assessment & Plan Note (Signed)
Secondary to bursitis. Reviewed notes from ortho [facility] today. Will continue PT.

## 2014-03-31 NOTE — Assessment & Plan Note (Signed)
Wt Readings from Last 3 Encounters:  03/31/14 250 lb (113.399 kg)  12/28/13 251 lb (113.853 kg)  09/27/13 255 lb (115.667 kg)   Body mass index is 38.02 kg/(m^2).  Encouraged better compliance with healthy diet and exercise with goal of weight loss.

## 2014-03-31 NOTE — Assessment & Plan Note (Signed)
Will check lipids and LFTs with labs today. Continue Crestor. 

## 2014-04-01 LAB — LIPID PANEL
CHOL/HDL RATIO: 4
Cholesterol: 143 mg/dL (ref 0–200)
HDL: 32.5 mg/dL — ABNORMAL LOW (ref >39.00)
LDL Cholesterol: 70 mg/dL (ref 0–99)
NONHDL: 110.5
Triglycerides: 204 mg/dL — ABNORMAL HIGH (ref 0.0–149.0)
VLDL: 40.8 mg/dL — ABNORMAL HIGH (ref 0.0–40.0)

## 2014-04-01 LAB — MICROALBUMIN / CREATININE URINE RATIO
CREATININE, U: 210.4 mg/dL
MICROALB UR: 0.5 mg/dL (ref 0.0–1.9)
MICROALB/CREAT RATIO: 0.2 mg/g (ref 0.0–30.0)

## 2014-04-01 LAB — COMPREHENSIVE METABOLIC PANEL
ALBUMIN: 4.1 g/dL (ref 3.5–5.2)
ALT: 15 U/L (ref 0–53)
AST: 20 U/L (ref 0–37)
Alkaline Phosphatase: 57 U/L (ref 39–117)
BUN: 22 mg/dL (ref 6–23)
CALCIUM: 9 mg/dL (ref 8.4–10.5)
CHLORIDE: 104 meq/L (ref 96–112)
CO2: 27 meq/L (ref 19–32)
Creatinine, Ser: 1.4 mg/dL (ref 0.4–1.5)
GFR: 54.79 mL/min — AB (ref >60.00)
GLUCOSE: 129 mg/dL — AB (ref 70–99)
Potassium: 4 mEq/L (ref 3.5–5.1)
SODIUM: 138 meq/L (ref 135–145)
TOTAL PROTEIN: 7 g/dL (ref 6.0–8.3)
Total Bilirubin: 0.9 mg/dL (ref 0.2–1.2)

## 2014-04-01 LAB — HEMOGLOBIN A1C: HEMOGLOBIN A1C: 6.8 % — AB (ref 4.6–6.5)

## 2014-04-05 DIAGNOSIS — D485 Neoplasm of uncertain behavior of skin: Secondary | ICD-10-CM | POA: Diagnosis not present

## 2014-04-05 DIAGNOSIS — L570 Actinic keratosis: Secondary | ICD-10-CM | POA: Diagnosis not present

## 2014-04-05 DIAGNOSIS — D235 Other benign neoplasm of skin of trunk: Secondary | ICD-10-CM | POA: Diagnosis not present

## 2014-04-25 DIAGNOSIS — M62838 Other muscle spasm: Secondary | ICD-10-CM | POA: Diagnosis not present

## 2014-04-25 DIAGNOSIS — M9981 Other biomechanical lesions of cervical region: Secondary | ICD-10-CM | POA: Diagnosis not present

## 2014-04-25 DIAGNOSIS — M5412 Radiculopathy, cervical region: Secondary | ICD-10-CM | POA: Diagnosis not present

## 2014-04-25 DIAGNOSIS — M999 Biomechanical lesion, unspecified: Secondary | ICD-10-CM | POA: Diagnosis not present

## 2014-05-05 DIAGNOSIS — E785 Hyperlipidemia, unspecified: Secondary | ICD-10-CM | POA: Diagnosis not present

## 2014-05-05 DIAGNOSIS — I2510 Atherosclerotic heart disease of native coronary artery without angina pectoris: Secondary | ICD-10-CM | POA: Diagnosis not present

## 2014-05-05 DIAGNOSIS — R0602 Shortness of breath: Secondary | ICD-10-CM | POA: Diagnosis not present

## 2014-05-05 DIAGNOSIS — I359 Nonrheumatic aortic valve disorder, unspecified: Secondary | ICD-10-CM | POA: Diagnosis not present

## 2014-05-05 DIAGNOSIS — I5032 Chronic diastolic (congestive) heart failure: Secondary | ICD-10-CM | POA: Diagnosis not present

## 2014-05-05 DIAGNOSIS — I10 Essential (primary) hypertension: Secondary | ICD-10-CM | POA: Diagnosis not present

## 2014-05-16 DIAGNOSIS — I2582 Chronic total occlusion of coronary artery: Secondary | ICD-10-CM | POA: Diagnosis not present

## 2014-05-16 DIAGNOSIS — I2510 Atherosclerotic heart disease of native coronary artery without angina pectoris: Secondary | ICD-10-CM | POA: Diagnosis not present

## 2014-05-16 DIAGNOSIS — I5023 Acute on chronic systolic (congestive) heart failure: Secondary | ICD-10-CM | POA: Diagnosis not present

## 2014-05-16 DIAGNOSIS — I359 Nonrheumatic aortic valve disorder, unspecified: Secondary | ICD-10-CM | POA: Diagnosis not present

## 2014-05-19 DIAGNOSIS — D696 Thrombocytopenia, unspecified: Secondary | ICD-10-CM | POA: Diagnosis not present

## 2014-05-19 DIAGNOSIS — I2789 Other specified pulmonary heart diseases: Secondary | ICD-10-CM | POA: Diagnosis present

## 2014-05-19 DIAGNOSIS — E871 Hypo-osmolality and hyponatremia: Secondary | ICD-10-CM | POA: Diagnosis not present

## 2014-05-19 DIAGNOSIS — I6509 Occlusion and stenosis of unspecified vertebral artery: Secondary | ICD-10-CM | POA: Diagnosis not present

## 2014-05-19 DIAGNOSIS — R5381 Other malaise: Secondary | ICD-10-CM | POA: Diagnosis not present

## 2014-05-19 DIAGNOSIS — I517 Cardiomegaly: Secondary | ICD-10-CM | POA: Diagnosis not present

## 2014-05-19 DIAGNOSIS — D62 Acute posthemorrhagic anemia: Secondary | ICD-10-CM | POA: Diagnosis not present

## 2014-05-19 DIAGNOSIS — I6529 Occlusion and stenosis of unspecified carotid artery: Secondary | ICD-10-CM | POA: Diagnosis not present

## 2014-05-19 DIAGNOSIS — I359 Nonrheumatic aortic valve disorder, unspecified: Secondary | ICD-10-CM | POA: Diagnosis not present

## 2014-05-19 DIAGNOSIS — Z48812 Encounter for surgical aftercare following surgery on the circulatory system: Secondary | ICD-10-CM | POA: Diagnosis not present

## 2014-05-19 DIAGNOSIS — Z951 Presence of aortocoronary bypass graft: Secondary | ICD-10-CM | POA: Diagnosis not present

## 2014-05-19 DIAGNOSIS — I252 Old myocardial infarction: Secondary | ICD-10-CM | POA: Diagnosis not present

## 2014-05-19 DIAGNOSIS — I658 Occlusion and stenosis of other precerebral arteries: Secondary | ICD-10-CM | POA: Diagnosis not present

## 2014-05-19 DIAGNOSIS — I6340 Cerebral infarction due to embolism of unspecified cerebral artery: Secondary | ICD-10-CM | POA: Diagnosis not present

## 2014-05-19 DIAGNOSIS — R7989 Other specified abnormal findings of blood chemistry: Secondary | ICD-10-CM | POA: Diagnosis not present

## 2014-05-19 DIAGNOSIS — Z87891 Personal history of nicotine dependence: Secondary | ICD-10-CM | POA: Diagnosis not present

## 2014-05-19 DIAGNOSIS — T8111XA Postprocedural  cardiogenic shock, initial encounter: Secondary | ICD-10-CM | POA: Diagnosis not present

## 2014-05-19 DIAGNOSIS — I5032 Chronic diastolic (congestive) heart failure: Secondary | ICD-10-CM | POA: Diagnosis present

## 2014-05-19 DIAGNOSIS — IMO0002 Reserved for concepts with insufficient information to code with codable children: Secondary | ICD-10-CM | POA: Diagnosis not present

## 2014-05-19 DIAGNOSIS — R414 Neurologic neglect syndrome: Secondary | ICD-10-CM | POA: Diagnosis not present

## 2014-05-19 DIAGNOSIS — N183 Chronic kidney disease, stage 3 unspecified: Secondary | ICD-10-CM | POA: Diagnosis present

## 2014-05-19 DIAGNOSIS — I700 Atherosclerosis of aorta: Secondary | ICD-10-CM | POA: Diagnosis not present

## 2014-05-19 DIAGNOSIS — I2510 Atherosclerotic heart disease of native coronary artery without angina pectoris: Secondary | ICD-10-CM | POA: Diagnosis present

## 2014-05-19 DIAGNOSIS — D72829 Elevated white blood cell count, unspecified: Secondary | ICD-10-CM | POA: Diagnosis not present

## 2014-05-19 DIAGNOSIS — I129 Hypertensive chronic kidney disease with stage 1 through stage 4 chronic kidney disease, or unspecified chronic kidney disease: Secondary | ICD-10-CM | POA: Diagnosis present

## 2014-05-19 DIAGNOSIS — Z79899 Other long term (current) drug therapy: Secondary | ICD-10-CM | POA: Diagnosis not present

## 2014-05-19 DIAGNOSIS — R918 Other nonspecific abnormal finding of lung field: Secondary | ICD-10-CM | POA: Diagnosis not present

## 2014-05-19 DIAGNOSIS — J90 Pleural effusion, not elsewhere classified: Secondary | ICD-10-CM | POA: Diagnosis not present

## 2014-05-19 DIAGNOSIS — IMO0001 Reserved for inherently not codable concepts without codable children: Secondary | ICD-10-CM | POA: Diagnosis not present

## 2014-05-19 DIAGNOSIS — E119 Type 2 diabetes mellitus without complications: Secondary | ICD-10-CM | POA: Diagnosis present

## 2014-05-19 DIAGNOSIS — J9383 Other pneumothorax: Secondary | ICD-10-CM | POA: Diagnosis not present

## 2014-05-19 DIAGNOSIS — Z4682 Encounter for fitting and adjustment of non-vascular catheter: Secondary | ICD-10-CM | POA: Diagnosis not present

## 2014-05-19 DIAGNOSIS — R4701 Aphasia: Secondary | ICD-10-CM | POA: Diagnosis not present

## 2014-05-19 DIAGNOSIS — J9819 Other pulmonary collapse: Secondary | ICD-10-CM | POA: Diagnosis not present

## 2014-05-19 DIAGNOSIS — I10 Essential (primary) hypertension: Secondary | ICD-10-CM | POA: Diagnosis not present

## 2014-05-19 DIAGNOSIS — G8918 Other acute postprocedural pain: Secondary | ICD-10-CM | POA: Diagnosis not present

## 2014-05-19 DIAGNOSIS — I6350 Cerebral infarction due to unspecified occlusion or stenosis of unspecified cerebral artery: Secondary | ICD-10-CM | POA: Diagnosis not present

## 2014-05-19 DIAGNOSIS — K5900 Constipation, unspecified: Secondary | ICD-10-CM | POA: Diagnosis not present

## 2014-05-19 DIAGNOSIS — E785 Hyperlipidemia, unspecified: Secondary | ICD-10-CM | POA: Diagnosis not present

## 2014-05-19 DIAGNOSIS — I82C19 Acute embolism and thrombosis of unspecified internal jugular vein: Secondary | ICD-10-CM | POA: Diagnosis not present

## 2014-05-19 DIAGNOSIS — Z7982 Long term (current) use of aspirin: Secondary | ICD-10-CM | POA: Diagnosis not present

## 2014-05-19 DIAGNOSIS — J95821 Acute postprocedural respiratory failure: Secondary | ICD-10-CM | POA: Diagnosis not present

## 2014-05-19 DIAGNOSIS — R1312 Dysphagia, oropharyngeal phase: Secondary | ICD-10-CM | POA: Diagnosis not present

## 2014-05-20 DIAGNOSIS — I639 Cerebral infarction, unspecified: Secondary | ICD-10-CM

## 2014-05-20 HISTORY — DX: Cerebral infarction, unspecified: I63.9

## 2014-05-21 DIAGNOSIS — E871 Hypo-osmolality and hyponatremia: Secondary | ICD-10-CM | POA: Insufficient documentation

## 2014-05-21 DIAGNOSIS — R471 Dysarthria and anarthria: Secondary | ICD-10-CM | POA: Insufficient documentation

## 2014-05-21 DIAGNOSIS — R739 Hyperglycemia, unspecified: Secondary | ICD-10-CM | POA: Insufficient documentation

## 2014-05-22 DIAGNOSIS — D72829 Elevated white blood cell count, unspecified: Secondary | ICD-10-CM | POA: Insufficient documentation

## 2014-05-22 DIAGNOSIS — IMO0002 Reserved for concepts with insufficient information to code with codable children: Secondary | ICD-10-CM | POA: Insufficient documentation

## 2014-05-22 DIAGNOSIS — D696 Thrombocytopenia, unspecified: Secondary | ICD-10-CM | POA: Insufficient documentation

## 2014-06-01 DIAGNOSIS — I2510 Atherosclerotic heart disease of native coronary artery without angina pectoris: Secondary | ICD-10-CM | POA: Diagnosis not present

## 2014-06-01 DIAGNOSIS — R5381 Other malaise: Secondary | ICD-10-CM | POA: Diagnosis not present

## 2014-06-01 DIAGNOSIS — I447 Left bundle-branch block, unspecified: Secondary | ICD-10-CM | POA: Diagnosis not present

## 2014-06-01 DIAGNOSIS — Z452 Encounter for adjustment and management of vascular access device: Secondary | ICD-10-CM | POA: Diagnosis not present

## 2014-06-01 DIAGNOSIS — T8140XA Infection following a procedure, unspecified, initial encounter: Secondary | ICD-10-CM | POA: Diagnosis not present

## 2014-06-01 DIAGNOSIS — K5900 Constipation, unspecified: Secondary | ICD-10-CM | POA: Diagnosis not present

## 2014-06-01 DIAGNOSIS — J90 Pleural effusion, not elsewhere classified: Secondary | ICD-10-CM | POA: Diagnosis not present

## 2014-06-01 DIAGNOSIS — Z951 Presence of aortocoronary bypass graft: Secondary | ICD-10-CM | POA: Diagnosis not present

## 2014-06-01 DIAGNOSIS — Z48812 Encounter for surgical aftercare following surgery on the circulatory system: Secondary | ICD-10-CM | POA: Diagnosis not present

## 2014-06-01 DIAGNOSIS — R5383 Other fatigue: Secondary | ICD-10-CM | POA: Diagnosis not present

## 2014-06-01 DIAGNOSIS — L02419 Cutaneous abscess of limb, unspecified: Secondary | ICD-10-CM | POA: Diagnosis not present

## 2014-06-01 DIAGNOSIS — I440 Atrioventricular block, first degree: Secondary | ICD-10-CM | POA: Diagnosis not present

## 2014-06-01 DIAGNOSIS — J9819 Other pulmonary collapse: Secondary | ICD-10-CM | POA: Diagnosis not present

## 2014-06-08 DIAGNOSIS — T8140XA Infection following a procedure, unspecified, initial encounter: Secondary | ICD-10-CM | POA: Diagnosis not present

## 2014-06-08 DIAGNOSIS — Z951 Presence of aortocoronary bypass graft: Secondary | ICD-10-CM | POA: Diagnosis not present

## 2014-06-08 DIAGNOSIS — I2510 Atherosclerotic heart disease of native coronary artery without angina pectoris: Secondary | ICD-10-CM | POA: Diagnosis not present

## 2014-06-15 ENCOUNTER — Other Ambulatory Visit: Payer: Self-pay | Admitting: Internal Medicine

## 2014-06-29 DIAGNOSIS — Z8673 Personal history of transient ischemic attack (TIA), and cerebral infarction without residual deficits: Secondary | ICD-10-CM | POA: Insufficient documentation

## 2014-06-29 DIAGNOSIS — I63412 Cerebral infarction due to embolism of left middle cerebral artery: Secondary | ICD-10-CM

## 2014-06-29 DIAGNOSIS — Z951 Presence of aortocoronary bypass graft: Secondary | ICD-10-CM | POA: Diagnosis not present

## 2014-06-29 DIAGNOSIS — Z952 Presence of prosthetic heart valve: Secondary | ICD-10-CM | POA: Insufficient documentation

## 2014-06-29 DIAGNOSIS — I059 Rheumatic mitral valve disease, unspecified: Secondary | ICD-10-CM | POA: Insufficient documentation

## 2014-06-29 DIAGNOSIS — I2510 Atherosclerotic heart disease of native coronary artery without angina pectoris: Secondary | ICD-10-CM | POA: Diagnosis not present

## 2014-06-29 DIAGNOSIS — E119 Type 2 diabetes mellitus without complications: Secondary | ICD-10-CM | POA: Diagnosis not present

## 2014-06-29 DIAGNOSIS — I3481 Nonrheumatic mitral (valve) annulus calcification: Secondary | ICD-10-CM | POA: Insufficient documentation

## 2014-06-29 HISTORY — DX: Cerebral infarction due to embolism of left middle cerebral artery: I63.412

## 2014-07-01 ENCOUNTER — Encounter: Payer: Self-pay | Admitting: Internal Medicine

## 2014-07-01 ENCOUNTER — Ambulatory Visit (INDEPENDENT_AMBULATORY_CARE_PROVIDER_SITE_OTHER): Payer: Medicare Other | Admitting: Internal Medicine

## 2014-07-01 VITALS — BP 110/60 | HR 92 | Temp 98.0°F | Ht 68.0 in | Wt 229.8 lb

## 2014-07-01 DIAGNOSIS — Z23 Encounter for immunization: Secondary | ICD-10-CM

## 2014-07-01 DIAGNOSIS — I6340 Cerebral infarction due to embolism of unspecified cerebral artery: Secondary | ICD-10-CM

## 2014-07-01 DIAGNOSIS — Z951 Presence of aortocoronary bypass graft: Secondary | ICD-10-CM

## 2014-07-01 DIAGNOSIS — Z952 Presence of prosthetic heart valve: Secondary | ICD-10-CM | POA: Diagnosis not present

## 2014-07-01 DIAGNOSIS — Z954 Presence of other heart-valve replacement: Secondary | ICD-10-CM | POA: Diagnosis not present

## 2014-07-01 DIAGNOSIS — E669 Obesity, unspecified: Secondary | ICD-10-CM | POA: Diagnosis not present

## 2014-07-01 DIAGNOSIS — I10 Essential (primary) hypertension: Secondary | ICD-10-CM | POA: Diagnosis not present

## 2014-07-01 DIAGNOSIS — E1169 Type 2 diabetes mellitus with other specified complication: Secondary | ICD-10-CM

## 2014-07-01 DIAGNOSIS — IMO0002 Reserved for concepts with insufficient information to code with codable children: Secondary | ICD-10-CM

## 2014-07-01 DIAGNOSIS — E119 Type 2 diabetes mellitus without complications: Secondary | ICD-10-CM

## 2014-07-01 DIAGNOSIS — I2510 Atherosclerotic heart disease of native coronary artery without angina pectoris: Secondary | ICD-10-CM | POA: Diagnosis not present

## 2014-07-01 DIAGNOSIS — D696 Thrombocytopenia, unspecified: Secondary | ICD-10-CM

## 2014-07-01 NOTE — Patient Instructions (Signed)
Labs today.  Flu shot today.  Follow up in 3 months.

## 2014-07-01 NOTE — Assessment & Plan Note (Signed)
Mild expressive aphasia after recent stroke. Slowing improving. Will continue to monitor. Discussed ST, but he prefers not to pursue this.

## 2014-07-01 NOTE — Assessment & Plan Note (Signed)
Will check A1c with labs today. Continue Metformin and Glipizide. 

## 2014-07-01 NOTE — Assessment & Plan Note (Signed)
Reviewed notes from [facility] today. Doing very well postoperatively. Discussed resuming exercise with stationary bike.

## 2014-07-01 NOTE — Progress Notes (Signed)
Subjective:    Patient ID: Guy Phillips, male    DOB: [birth year], 73 y.o.   MRN: [id number]  HPI 73YO male presents for follow up.  S/p 3V CABG 8/27 with AVR at [facility] with Dr. Norm Parcel. Complicated by left hemipheric cortical stroke 8/28. Also had superficial infection of left leg wound. Treated with Bactrim.  Pt notes residual expressive aphasia after stroke. Feels this is getting better. Feeling well with no chest pain, no dyspnea. Not on physical therapy. Leg wound has healed. Off antibiotics.  Review of Systems  Constitutional: Negative for fever, chills, activity change, appetite change, fatigue and unexpected weight change.  Eyes: Negative for visual disturbance.  Respiratory: Negative for cough and shortness of breath.   Cardiovascular: Negative for chest pain, palpitations and leg swelling.  Gastrointestinal: Negative for nausea, vomiting, abdominal pain, diarrhea, constipation and abdominal distention.  Genitourinary: Negative for dysuria, urgency and difficulty urinating.  Musculoskeletal: Negative for arthralgias, gait problem and myalgias.  Skin: Negative for color change and rash.  Hematological: Negative for adenopathy.  Psychiatric/Behavioral: Negative for sleep disturbance and dysphoric mood. The patient is not nervous/anxious.        Objective:    BP 110/60  Pulse 92  Temp(Src) 98 F (36.7 C) (Oral)  Ht 5\' 8"  (1.727 m)  Wt 229 lb 12 oz (104.214 kg)  BMI 34.94 kg/m2  SpO2 95% Physical Exam  Constitutional: He is oriented to person, place, and time. He appears well-developed and well-nourished. No distress.  HENT:  Head: Normocephalic and atraumatic.  Right Ear: External ear normal.  Left Ear: External ear normal.  Nose: Nose normal.  Mouth/Throat: Oropharynx is clear and moist. No oropharyngeal exudate.  Eyes: Conjunctivae and EOM are normal. Pupils are equal, round, and reactive to light. Right eye exhibits no discharge. Left eye exhibits  no discharge. No scleral icterus.  Neck: Normal range of motion. Neck supple. No tracheal deviation present. No thyromegaly present.  Cardiovascular: Normal rate, regular rhythm and normal heart sounds.  Exam reveals no gallop and no friction rub.   No murmur heard. Pulmonary/Chest: Effort normal and breath sounds normal. No accessory muscle usage. Not tachypneic. No respiratory distress. He has no decreased breath sounds. He has no wheezes. He has no rhonchi. He has no rales. He exhibits no tenderness.    Musculoskeletal: Normal range of motion. He exhibits no edema.  Lymphadenopathy:    He has no cervical adenopathy.  Neurological: He is alert and oriented to person, place, and time. No cranial nerve deficit. Coordination normal.  Skin: Skin is warm and dry. No rash noted. He is not diaphoretic. There is erythema. No pallor.     Psychiatric: He has a normal mood and affect. His behavior is normal. Judgment and thought content normal.          Assessment & Plan:   Problem List Items Addressed This Visit     Unprioritized   CAD (coronary artery disease)     Symptomatically, doing well after 3v CABG. Continue Aspirin, Statin, betablocker, ACEi.    Relevant Medications      metoprolol tartrate (LOPRESSOR) 25 MG tablet      triamterene-hydrochlorothiazide (DYAZIDE) 50-25 MG per capsule   Other Relevant Orders      Comprehensive metabolic panel      Hemoglobin A1c   Cerebral embolism with cerebral infarction     Mild expressive aphasia after recent stroke. Slowing improving. Will continue to monitor. Discussed ST, but he prefers not to pursue  this.    Relevant Medications      metoprolol tartrate (LOPRESSOR) 25 MG tablet      triamterene-hydrochlorothiazide (DYAZIDE) 50-25 MG per capsule   Other Relevant Orders      Comprehensive metabolic panel      Hemoglobin A1c   Change in blood platelet count     Mild thrombocytopenia noted during recent surgery. Resolved during  hospitalization, last plt count >500    Diabetes mellitus type 2 in obese - Primary     Will check A1c with labs today. Continue Metformin and Glipizide.    Relevant Orders      Comprehensive metabolic panel      Hemoglobin A1c   H/O aortic valve replacement     Reviewed notes from [facility] today. Doing very well postoperatively. Discussed resuming exercise with stationary bike.    Relevant Orders      Comprehensive metabolic panel      Hemoglobin A1c   H/O coronary artery bypass surgery     Reviewed notes from [place]. Doing very well postoperatively. Continue current medications.    Relevant Orders      Comprehensive metabolic panel      Hemoglobin A1c   Hypertension      BP Readings from Last 3 Encounters:  07/01/14 110/60  03/31/14 118/60  12/28/13 130/70   BP well controlled. Renal function with labs today. Continue current medications.    Relevant Medications      metoprolol tartrate (LOPRESSOR) 25 MG tablet      triamterene-hydrochlorothiazide (DYAZIDE) 50-25 MG per capsule   Other Relevant Orders      Comprehensive metabolic panel      Hemoglobin A1c    Other Visit Diagnoses   Encounter for immunization            Return in about 3 months (around 10/01/2014) for Recheck of Diabetes.

## 2014-07-01 NOTE — Assessment & Plan Note (Signed)
Reviewed notes from [place]. Doing very well postoperatively. Continue current medications.

## 2014-07-01 NOTE — Assessment & Plan Note (Signed)
Symptomatically, doing well after 3v CABG. Continue Aspirin, Statin, betablocker, ACEi.

## 2014-07-01 NOTE — Assessment & Plan Note (Signed)
Mild thrombocytopenia noted during recent surgery. Resolved during hospitalization, last plt count >500

## 2014-07-01 NOTE — Progress Notes (Signed)
Pre visit review using our clinic review tool, if applicable. No additional management support is needed unless otherwise documented below in the visit note. 

## 2014-07-01 NOTE — Assessment & Plan Note (Signed)
BP Readings from Last 3 Encounters:  07/01/14 110/60  03/31/14 118/60  12/28/13 130/70   BP well controlled. Renal function with labs today. Continue current medications.

## 2014-07-02 LAB — COMPREHENSIVE METABOLIC PANEL
ALT: 9 U/L (ref 0–53)
AST: 14 U/L (ref 0–37)
Albumin: 4.2 g/dL (ref 3.5–5.2)
Alkaline Phosphatase: 109 U/L (ref 39–117)
BUN: 15 mg/dL (ref 6–23)
CALCIUM: 9.6 mg/dL (ref 8.4–10.5)
CHLORIDE: 103 meq/L (ref 96–112)
CO2: 30 meq/L (ref 19–32)
Creat: 1.07 mg/dL (ref 0.50–1.35)
Glucose, Bld: 67 mg/dL — ABNORMAL LOW (ref 70–99)
Potassium: 4.8 mEq/L (ref 3.5–5.3)
Sodium: 141 mEq/L (ref 135–145)
Total Bilirubin: 0.5 mg/dL (ref 0.2–1.2)
Total Protein: 6.8 g/dL (ref 6.0–8.3)

## 2014-07-02 LAB — HEMOGLOBIN A1C
HEMOGLOBIN A1C: 6 % — AB (ref <5.7)
MEAN PLASMA GLUCOSE: 126 mg/dL — AB (ref <117)

## 2014-08-08 ENCOUNTER — Encounter: Payer: Self-pay | Admitting: Internal Medicine

## 2014-08-08 ENCOUNTER — Ambulatory Visit (INDEPENDENT_AMBULATORY_CARE_PROVIDER_SITE_OTHER)
Admission: RE | Admit: 2014-08-08 | Discharge: 2014-08-08 | Disposition: A | Discharge status: Home / Self Care | Payer: Medicare Other | Source: Ambulatory Visit | Attending: Internal Medicine | Admitting: Internal Medicine

## 2014-08-08 ENCOUNTER — Ambulatory Visit (INDEPENDENT_AMBULATORY_CARE_PROVIDER_SITE_OTHER): Payer: Medicare Other | Admitting: Internal Medicine

## 2014-08-08 VITALS — BP 138/70 | HR 68 | Temp 98.2°F | Wt 230.0 lb

## 2014-08-08 DIAGNOSIS — R059 Cough, unspecified: Secondary | ICD-10-CM

## 2014-08-08 DIAGNOSIS — R05 Cough: Secondary | ICD-10-CM | POA: Diagnosis not present

## 2014-08-08 DIAGNOSIS — I6340 Cerebral infarction due to embolism of unspecified cerebral artery: Secondary | ICD-10-CM

## 2014-08-08 NOTE — Patient Instructions (Signed)
Cough, Adult  A cough is a reflex that helps clear your throat and airways. It can help heal the body or may be a reaction to an irritated airway. A cough may only last 2 or 3 weeks (acute) or may last more than 8 weeks (chronic).  CAUSES Acute cough:  Viral or bacterial infections. Chronic cough:  Infections.  Allergies.  Asthma.  Post-nasal drip.  Smoking.  Heartburn or acid reflux.  Some medicines.  Chronic lung problems (COPD).  Cancer. SYMPTOMS   Cough.  Fever.  Chest pain.  Increased breathing rate.  High-pitched whistling sound when breathing (wheezing).  Colored mucus that you cough up (sputum). TREATMENT   A bacterial cough may be treated with antibiotic medicine.  A viral cough must run its course and will not respond to antibiotics.  Your caregiver may recommend other treatments if you have a chronic cough. HOME CARE INSTRUCTIONS   Only take over-the-counter or prescription medicines for pain, discomfort, or fever as directed by your caregiver. Use cough suppressants only as directed by your caregiver.  Use a cold steam vaporizer or humidifier in your bedroom or home to help loosen secretions.  Sleep in a semi-upright position if your cough is worse at night.  Rest as needed.  Stop smoking if you smoke. SEEK IMMEDIATE MEDICAL CARE IF:   You have pus in your sputum.  Your cough starts to worsen.  You cannot control your cough with suppressants and are losing sleep.  You begin coughing up blood.  You have difficulty breathing.  You develop pain which is getting worse or is uncontrolled with medicine.  You have a fever. MAKE SURE YOU:   Understand these instructions.  Will watch your condition.  Will get help right away if you are not doing well or get worse. Document Released: 03/08/2011 Document Revised: 12/02/2011 Document Reviewed: 03/08/2011 ExitCare Patient Information 2015 [employer]. This information is not intended  to replace advice given to you by your health care provider. Make sure you discuss any questions you have with your health care provider.  

## 2014-08-08 NOTE — Progress Notes (Signed)
Subjective:    Patient ID: Guy Phillips, male    DOB: 1941/02/27, 73 y.o.   MRN: [id number]  HPI  Pt presents to the clinic today with c/o a cough. He reports this started 4 weeks ago. The cough is productive of times. He is not sure what color it is, he does not look at it. He denies fever, chills or URI symptoms. He has taken Mucinex OTC which works well but reports the cough comes right back when he stops the Mucinex. He has not started any new medications. He has no history of seasonal allergies or breathing problems. He has not had sick contacts.  Review of Systems      Past Medical History  Diagnosis Date  . Hypertension   . Hyperlipidemia   . CAD (coronary artery disease)     s/p MI 2011 Cath showed 100% occlusion of RCA with collaterals open, followed by Dr. Ubaldo Glassing  . Hernia     Umbilical  . Kidney stone     2008  . Diabetes mellitus Dx 2005    HgbA1c 6.8%, Optho [facility] 2001  . 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 Evorn Gong    Current Outpatient Prescriptions  Medication Sig Dispense Refill  . acetaminophen (TYLENOL) 325 MG tablet Take by mouth.    Marland Kitchen aspirin 81 MG tablet Take 81 mg by mouth daily.      . Cholecalciferol (VITAMIN D3) 2000 UNITS TABS Take 1 tablet by mouth daily.      . fesoterodine (TOVIAZ) 8 MG TB24 Take 1 tablet (8 mg total) by mouth daily. 90 tablet 3  . fexofenadine (ALLEGRA) 180 MG tablet Take 180 mg by mouth daily.    . fluticasone (FLONASE) 50 MCG/ACT nasal spray Place into the nose Daily.    Marland Kitchen glipiZIDE (GLUCOTROL) 5 MG tablet TAKE 1 TABLET BY MOUTH TWICE A DAY BEFORE MEALS 180 tablet 2  . metFORMIN (GLUCOPHAGE) 1000 MG tablet TAKE 1 TABLET TWICE A DAY  WITH MEALS 180 tablet 1  . metoprolol tartrate (LOPRESSOR) 25 MG tablet Take 12.5 mg by mouth. Take 1/2 tablet daily    . Multiple Vitamin (MULTIVITAMIN) tablet Take 1 tablet by mouth daily.      . nitroGLYCERIN (NITROSTAT) 0.4 MG SL tablet Place 0.4 mg under the  tongue every 5 (five) minutes as needed.    . rosuvastatin (CRESTOR) 20 MG tablet Take 1 tablet (20 mg total) by mouth daily. 90 tablet 4  . senna-docusate (SENOKOT-S) 8.6-50 MG per tablet Take by mouth.    . triamterene-hydrochlorothiazide (DYAZIDE) 50-25 MG per capsule Take 50 capsules by mouth. daily     No current facility-administered medications for this visit.    No Known Allergies  History reviewed. No pertinent family history.  History   Social History  . Marital Status: Married    Spouse Name: N/A    Number of Children: N/A  . Years of Education: N/A   Occupational History  . Not on file.   Social History Main Topics  . Smoking status: Former Smoker    Quit date: 07/03/1991  . Smokeless tobacco: Never Used  . Alcohol Use: No  . Drug Use: No  . Sexual Activity: Not on file   Other Topics Concern  . Not on file   Social History Narrative     Constitutional: Denies fever, malaise, fatigue, headache or abrupt weight changes.  HEENT: Denies eye pain, eye redness, ear  pain, ringing in the ears, wax buildup, runny nose, nasal congestion, bloody nose, or sore throat. Respiratory: Pt reports cough. Denies difficulty breathing, shortness of breath, or sputum production.   Cardiovascular: Denies chest pain, chest tightness, palpitations or swelling in the hands or feet.    No other specific complaints in a complete review of systems (except as listed in HPI above).  Objective:   Physical Exam   BP 138/70 mmHg  Pulse 68  Temp(Src) 98.2 F (36.8 C) (Oral)  Wt 230 lb (104.327 kg)  SpO2 98% Wt Readings from Last 3 Encounters:  08/08/14 230 lb (104.327 kg)  07/01/14 229 lb 12 oz (104.214 kg)  03/31/14 250 lb (113.399 kg)    General: Appears his stated age, obese but well developed, well nourished in NAD. HEENT: Head: normal shape and size; Nose: mucosa pink and moist, septum midline; Throat/Mouth: Teeth present, mucosa pink and moist, no exudate, lesions or  ulcerations noted.  Cardiovascular: Normal rate and rhythm. S1,S2 noted. Murmur noted. No rubs or gallops noted.  Pulmonary/Chest: Normal effort and positive vesicular breath sounds. No respiratory distress. No wheezes, rales or ronchi noted.    BMET    Component Value Date/Time   NA 141 07/01/2014 1602   K 4.8 07/01/2014 1602   CL 103 07/01/2014 1602   CO2 30 07/01/2014 1602   GLUCOSE 67* 07/01/2014 1602   BUN 15 07/01/2014 1602   CREATININE 1.07 07/01/2014 1602   CREATININE 1.4 03/31/2014 1625   CALCIUM 9.6 07/01/2014 1602    Lipid Panel     Component Value Date/Time   CHOL 143 03/31/2014 1625   TRIG 204.0* 03/31/2014 1625   HDL 32.50* 03/31/2014 1625   CHOLHDL 4 03/31/2014 1625   VLDL 40.8* 03/31/2014 1625   LDLCALC 70 03/31/2014 1625    CBC    Component Value Date/Time   WBC 7.4 07/03/2011 1014   RBC 4.29 07/03/2011 1014   HGB 13.7 07/03/2011 1014   HCT 40.7 07/03/2011 1014   PLT 205.0 07/03/2011 1014   MCV 94.9 07/03/2011 1014   MCHC 33.8 07/03/2011 1014   RDW 12.6 07/03/2011 1014   LYMPHSABS 1.7 07/03/2011 1014   MONOABS 0.6 07/03/2011 1014   EOSABS 0.6 07/03/2011 1014   BASOSABS 0.0 07/03/2011 1014    Hgb A1C Lab Results  Component Value Date   HGBA1C 6.0* 07/01/2014        Assessment & Plan:   Cough:  Exam is normal Will check chest xray  He declines RX for cough syrup with codeine Continue Mucinex for now  Will follow up with you after the xray

## 2014-08-08 NOTE — Progress Notes (Signed)
Pre visit review using our clinic review tool, if applicable. No additional management support is needed unless otherwise documented below in the visit note. 

## 2014-08-12 DIAGNOSIS — M9903 Segmental and somatic dysfunction of lumbar region: Secondary | ICD-10-CM | POA: Diagnosis not present

## 2014-08-12 DIAGNOSIS — M5416 Radiculopathy, lumbar region: Secondary | ICD-10-CM | POA: Diagnosis not present

## 2014-08-12 DIAGNOSIS — M62830 Muscle spasm of back: Secondary | ICD-10-CM | POA: Diagnosis not present

## 2014-08-12 DIAGNOSIS — M9902 Segmental and somatic dysfunction of thoracic region: Secondary | ICD-10-CM | POA: Diagnosis not present

## 2014-08-15 DIAGNOSIS — M5416 Radiculopathy, lumbar region: Secondary | ICD-10-CM | POA: Diagnosis not present

## 2014-08-15 DIAGNOSIS — M9903 Segmental and somatic dysfunction of lumbar region: Secondary | ICD-10-CM | POA: Diagnosis not present

## 2014-08-15 DIAGNOSIS — M9902 Segmental and somatic dysfunction of thoracic region: Secondary | ICD-10-CM | POA: Diagnosis not present

## 2014-08-15 DIAGNOSIS — M62830 Muscle spasm of back: Secondary | ICD-10-CM | POA: Diagnosis not present

## 2014-08-24 DIAGNOSIS — M9902 Segmental and somatic dysfunction of thoracic region: Secondary | ICD-10-CM | POA: Diagnosis not present

## 2014-08-24 DIAGNOSIS — M9903 Segmental and somatic dysfunction of lumbar region: Secondary | ICD-10-CM | POA: Diagnosis not present

## 2014-08-24 DIAGNOSIS — M5416 Radiculopathy, lumbar region: Secondary | ICD-10-CM | POA: Diagnosis not present

## 2014-08-24 DIAGNOSIS — M62830 Muscle spasm of back: Secondary | ICD-10-CM | POA: Diagnosis not present

## 2014-09-21 DIAGNOSIS — M5416 Radiculopathy, lumbar region: Secondary | ICD-10-CM | POA: Diagnosis not present

## 2014-09-21 DIAGNOSIS — M9902 Segmental and somatic dysfunction of thoracic region: Secondary | ICD-10-CM | POA: Diagnosis not present

## 2014-09-21 DIAGNOSIS — M9903 Segmental and somatic dysfunction of lumbar region: Secondary | ICD-10-CM | POA: Diagnosis not present

## 2014-09-21 DIAGNOSIS — M62830 Muscle spasm of back: Secondary | ICD-10-CM | POA: Diagnosis not present

## 2014-09-22 ENCOUNTER — Other Ambulatory Visit: Payer: Self-pay | Admitting: Internal Medicine

## 2014-10-01 ENCOUNTER — Other Ambulatory Visit: Payer: Self-pay | Admitting: Internal Medicine

## 2014-10-04 ENCOUNTER — Ambulatory Visit: Payer: Medicare Other | Admitting: Internal Medicine

## 2014-10-04 DIAGNOSIS — H40013 Open angle with borderline findings, low risk, bilateral: Secondary | ICD-10-CM | POA: Diagnosis not present

## 2014-10-04 DIAGNOSIS — H2513 Age-related nuclear cataract, bilateral: Secondary | ICD-10-CM | POA: Diagnosis not present

## 2014-10-05 LAB — HM DIABETES EYE EXAM

## 2014-10-17 DIAGNOSIS — L570 Actinic keratosis: Secondary | ICD-10-CM | POA: Diagnosis not present

## 2014-10-17 DIAGNOSIS — D235 Other benign neoplasm of skin of trunk: Secondary | ICD-10-CM | POA: Diagnosis not present

## 2014-10-19 ENCOUNTER — Ambulatory Visit (INDEPENDENT_AMBULATORY_CARE_PROVIDER_SITE_OTHER): Payer: Medicare Other | Admitting: Internal Medicine

## 2014-10-19 ENCOUNTER — Encounter: Payer: Self-pay | Admitting: Internal Medicine

## 2014-10-19 VITALS — BP 137/82 | HR 65 | Temp 97.6°F | Ht 68.0 in | Wt 238.8 lb

## 2014-10-19 DIAGNOSIS — E669 Obesity, unspecified: Secondary | ICD-10-CM

## 2014-10-19 DIAGNOSIS — E119 Type 2 diabetes mellitus without complications: Secondary | ICD-10-CM | POA: Diagnosis not present

## 2014-10-19 DIAGNOSIS — I2510 Atherosclerotic heart disease of native coronary artery without angina pectoris: Secondary | ICD-10-CM | POA: Diagnosis not present

## 2014-10-19 DIAGNOSIS — I10 Essential (primary) hypertension: Secondary | ICD-10-CM | POA: Diagnosis not present

## 2014-10-19 DIAGNOSIS — E785 Hyperlipidemia, unspecified: Secondary | ICD-10-CM

## 2014-10-19 DIAGNOSIS — E1169 Type 2 diabetes mellitus with other specified complication: Secondary | ICD-10-CM

## 2014-10-19 LAB — LIPID PANEL
Cholesterol: 153 mg/dL (ref 0–200)
HDL: 37.3 mg/dL — ABNORMAL LOW (ref >39.00)
NonHDL: 115.7
TRIGLYCERIDES: 256 mg/dL — AB (ref 0.0–149.0)
Total CHOL/HDL Ratio: 4
VLDL: 51.2 mg/dL — AB (ref 0.0–40.0)

## 2014-10-19 LAB — COMPREHENSIVE METABOLIC PANEL
ALT: 14 U/L (ref 0–53)
AST: 16 U/L (ref 0–37)
Albumin: 4.3 g/dL (ref 3.5–5.2)
Alkaline Phosphatase: 75 U/L (ref 39–117)
BILIRUBIN TOTAL: 0.7 mg/dL (ref 0.2–1.2)
BUN: 18 mg/dL (ref 6–23)
CO2: 31 mEq/L (ref 19–32)
CREATININE: 1.07 mg/dL (ref 0.40–1.50)
Calcium: 9.7 mg/dL (ref 8.4–10.5)
Chloride: 101 mEq/L (ref 96–112)
GFR: 72.15 mL/min (ref >60.00)
GLUCOSE: 148 mg/dL — AB (ref 70–99)
Potassium: 4.9 mEq/L (ref 3.5–5.1)
Sodium: 138 mEq/L (ref 135–145)
Total Protein: 6.9 g/dL (ref 6.0–8.3)

## 2014-10-19 LAB — MICROALBUMIN / CREATININE URINE RATIO
Creatinine,U: 225.8 mg/dL
MICROALB UR: 1.5 mg/dL (ref 0.0–1.9)
MICROALB/CREAT RATIO: 0.7 mg/g (ref 0.0–30.0)

## 2014-10-19 LAB — LDL CHOLESTEROL, DIRECT: Direct LDL: 90 mg/dL

## 2014-10-19 LAB — HEMOGLOBIN A1C: Hgb A1c MFr Bld: 7.5 % — ABNORMAL HIGH (ref 4.6–6.5)

## 2014-10-19 LAB — HM DIABETES FOOT EXAM: HM Diabetic Foot Exam: NORMAL

## 2014-10-19 NOTE — Assessment & Plan Note (Signed)
Wt Readings from Last 3 Encounters:  10/19/14 238 lb 12 oz (108.296 kg)  08/08/14 230 lb (104.327 kg)  07/01/14 229 lb 12 oz (104.214 kg)   Body mass index is 36.31 kg/(m^2). Encouraged healthy diet and exercise.

## 2014-10-19 NOTE — Assessment & Plan Note (Signed)
Symptomatically, doing well. No CP. Continue current medications.

## 2014-10-19 NOTE — Assessment & Plan Note (Signed)
Will check A1c with labs. Continue Metformin and Glipizide. Foot exam normal today. Eye exam completed 09/2014.

## 2014-10-19 NOTE — Progress Notes (Signed)
Subjective:    Patient ID: Guy Phillips, male    DOB: 05/20/1941, 74 y.o.   MRN: [id number]  HPI 74YO male presents for follow up.  DM - Having some lows with diaphoresis, improves with eating. Doesn't always check BG during that time.  No BG over 200. This morning BG 156.  No recent chest pain, dyspnea. Has some tingling superficially at site of chest wall surgical scar.  Staying active. Goes to gym on a regular basis. Notes some dietary indiscretion.  Wt Readings from Last 3 Encounters:  10/19/14 238 lb 12 oz (108.296 kg)  08/08/14 230 lb (104.327 kg)  07/01/14 229 lb 12 oz (104.214 kg)      Past medical, surgical, family and social history per today's encounter.  Review of Systems  Constitutional: Negative for fever, chills, activity change, appetite change, fatigue and unexpected weight change.  Eyes: Negative for visual disturbance.  Respiratory: Negative for cough and shortness of breath.   Cardiovascular: Negative for chest pain, palpitations and leg swelling.  Gastrointestinal: Negative for nausea, vomiting, abdominal pain, diarrhea, constipation and abdominal distention.  Genitourinary: Negative for dysuria, urgency and difficulty urinating.  Musculoskeletal: Negative for myalgias, arthralgias and gait problem.  Skin: Negative for color change and rash.  Hematological: Negative for adenopathy.  Psychiatric/Behavioral: Negative for sleep disturbance and dysphoric mood. The patient is not nervous/anxious.        Objective:    BP 137/82 mmHg  Pulse 65  Temp(Src) 97.6 F (36.4 C) (Oral)  Ht 5\' 8"  (1.727 m)  Wt 238 lb 12 oz (108.296 kg)  BMI 36.31 kg/m2  SpO2 97% Physical Exam  Constitutional: He is oriented to person, place, and time. He appears well-developed and well-nourished. No distress.  HENT:  Head: Normocephalic and atraumatic.  Right Ear: External ear normal.  Left Ear: External ear normal.  Nose: Nose normal.  Mouth/Throat: Oropharynx is  clear and moist. No oropharyngeal exudate.  Eyes: Conjunctivae and EOM are normal. Pupils are equal, round, and reactive to light. Right eye exhibits no discharge. Left eye exhibits no discharge. No scleral icterus.  Neck: Normal range of motion. Neck supple. No tracheal deviation present. No thyromegaly present.  Cardiovascular: Normal rate, regular rhythm and normal heart sounds.  Exam reveals no gallop and no friction rub.   No murmur heard. Pulmonary/Chest: Effort normal and breath sounds normal. No accessory muscle usage. No tachypnea. No respiratory distress. He has no decreased breath sounds. He has no wheezes. He has no rhonchi. He has no rales. He exhibits no tenderness.  Musculoskeletal: Normal range of motion. He exhibits no edema.  Lymphadenopathy:    He has no cervical adenopathy.  Neurological: He is alert and oriented to person, place, and time. No cranial nerve deficit. Coordination normal.  Skin: Skin is warm and dry. No rash noted. He is not diaphoretic. No erythema. No pallor.  Psychiatric: He has a normal mood and affect. His behavior is normal. Judgment and thought content normal.          Assessment & Plan:   Problem List Items Addressed This Visit      Unprioritized   CAD (coronary artery disease)    Symptomatically, doing well. No CP. Continue current medications.      Diabetes mellitus type 2 in obese - Primary    Will check A1c with labs. Continue Metformin and Glipizide. Foot exam normal today. Eye exam completed 09/2014.      Relevant Orders   Comprehensive metabolic panel  Hemoglobin A1c   Lipid panel   Microalbumin / creatinine urine ratio   Hyperlipidemia    Will check lipids and LFTs with labs. Continue Rosuvastatin.      Hypertension    BP Readings from Last 3 Encounters:  10/19/14 137/82  08/08/14 138/70  07/01/14 110/60   BP well controlled. Renal function with labs. Continue current medications.      Obesity (BMI 30-39.9)    Wt  Readings from Last 3 Encounters:  10/19/14 238 lb 12 oz (108.296 kg)  08/08/14 230 lb (104.327 kg)  07/01/14 229 lb 12 oz (104.214 kg)   Body mass index is 36.31 kg/(m^2). Encouraged healthy diet and exercise.          Return in about 3 months (around 01/18/2015) for Recheck of Diabetes.

## 2014-10-19 NOTE — Progress Notes (Signed)
Pre visit review using our clinic review tool, if applicable. No additional management support is needed unless otherwise documented below in the visit note. 

## 2014-10-19 NOTE — Assessment & Plan Note (Signed)
Will check lipids and LFTs with labs. Continue Rosuvastatin.

## 2014-10-19 NOTE — Patient Instructions (Signed)
Labs today.   Follow up in 3 months.  

## 2014-10-19 NOTE — Assessment & Plan Note (Signed)
BP Readings from Last 3 Encounters:  10/19/14 137/82  08/08/14 138/70  07/01/14 110/60   BP well controlled. Renal function with labs. Continue current medications.

## 2014-10-21 DIAGNOSIS — M9902 Segmental and somatic dysfunction of thoracic region: Secondary | ICD-10-CM | POA: Diagnosis not present

## 2014-10-21 DIAGNOSIS — M5416 Radiculopathy, lumbar region: Secondary | ICD-10-CM | POA: Diagnosis not present

## 2014-10-21 DIAGNOSIS — M62830 Muscle spasm of back: Secondary | ICD-10-CM | POA: Diagnosis not present

## 2014-10-21 DIAGNOSIS — M9903 Segmental and somatic dysfunction of lumbar region: Secondary | ICD-10-CM | POA: Diagnosis not present

## 2014-11-16 DIAGNOSIS — M62830 Muscle spasm of back: Secondary | ICD-10-CM | POA: Diagnosis not present

## 2014-11-16 DIAGNOSIS — M9903 Segmental and somatic dysfunction of lumbar region: Secondary | ICD-10-CM | POA: Diagnosis not present

## 2014-11-16 DIAGNOSIS — M9902 Segmental and somatic dysfunction of thoracic region: Secondary | ICD-10-CM | POA: Diagnosis not present

## 2014-11-16 DIAGNOSIS — M5416 Radiculopathy, lumbar region: Secondary | ICD-10-CM | POA: Diagnosis not present

## 2014-11-30 ENCOUNTER — Other Ambulatory Visit: Payer: Self-pay | Admitting: Internal Medicine

## 2014-12-14 DIAGNOSIS — M9903 Segmental and somatic dysfunction of lumbar region: Secondary | ICD-10-CM | POA: Diagnosis not present

## 2014-12-14 DIAGNOSIS — M5416 Radiculopathy, lumbar region: Secondary | ICD-10-CM | POA: Diagnosis not present

## 2014-12-14 DIAGNOSIS — M9902 Segmental and somatic dysfunction of thoracic region: Secondary | ICD-10-CM | POA: Diagnosis not present

## 2014-12-14 DIAGNOSIS — M62830 Muscle spasm of back: Secondary | ICD-10-CM | POA: Diagnosis not present

## 2014-12-28 DIAGNOSIS — L570 Actinic keratosis: Secondary | ICD-10-CM | POA: Diagnosis not present

## 2014-12-28 DIAGNOSIS — D235 Other benign neoplasm of skin of trunk: Secondary | ICD-10-CM | POA: Diagnosis not present

## 2015-01-11 DIAGNOSIS — M5416 Radiculopathy, lumbar region: Secondary | ICD-10-CM | POA: Diagnosis not present

## 2015-01-11 DIAGNOSIS — M9902 Segmental and somatic dysfunction of thoracic region: Secondary | ICD-10-CM | POA: Diagnosis not present

## 2015-01-11 DIAGNOSIS — M62830 Muscle spasm of back: Secondary | ICD-10-CM | POA: Diagnosis not present

## 2015-01-11 DIAGNOSIS — M9903 Segmental and somatic dysfunction of lumbar region: Secondary | ICD-10-CM | POA: Diagnosis not present

## 2015-01-18 ENCOUNTER — Ambulatory Visit (INDEPENDENT_AMBULATORY_CARE_PROVIDER_SITE_OTHER): Payer: Medicare Other | Admitting: Internal Medicine

## 2015-01-18 ENCOUNTER — Encounter: Payer: Self-pay | Admitting: Internal Medicine

## 2015-01-18 ENCOUNTER — Telehealth: Payer: Self-pay | Admitting: Internal Medicine

## 2015-01-18 VITALS — BP 132/77 | HR 69 | Temp 98.2°F | Ht 68.0 in | Wt 238.5 lb

## 2015-01-18 DIAGNOSIS — E1169 Type 2 diabetes mellitus with other specified complication: Secondary | ICD-10-CM

## 2015-01-18 DIAGNOSIS — I2510 Atherosclerotic heart disease of native coronary artery without angina pectoris: Secondary | ICD-10-CM | POA: Diagnosis not present

## 2015-01-18 DIAGNOSIS — E669 Obesity, unspecified: Secondary | ICD-10-CM

## 2015-01-18 DIAGNOSIS — E119 Type 2 diabetes mellitus without complications: Secondary | ICD-10-CM | POA: Diagnosis not present

## 2015-01-18 DIAGNOSIS — I10 Essential (primary) hypertension: Secondary | ICD-10-CM

## 2015-01-18 DIAGNOSIS — E785 Hyperlipidemia, unspecified: Secondary | ICD-10-CM | POA: Diagnosis not present

## 2015-01-18 LAB — MICROALBUMIN / CREATININE URINE RATIO
Creatinine,U: 254.3 mg/dL
MICROALB UR: 1.8 mg/dL (ref 0.0–1.9)
Microalb Creat Ratio: 0.7 mg/g (ref 0.0–30.0)

## 2015-01-18 LAB — HEMOGLOBIN A1C: HEMOGLOBIN A1C: 7 % — AB (ref 4.6–6.5)

## 2015-01-18 LAB — COMPREHENSIVE METABOLIC PANEL
ALBUMIN: 4.4 g/dL (ref 3.5–5.2)
ALT: 19 U/L (ref 0–53)
AST: 17 U/L (ref 0–37)
Alkaline Phosphatase: 74 U/L (ref 39–117)
BUN: 17 mg/dL (ref 6–23)
CALCIUM: 9.7 mg/dL (ref 8.4–10.5)
CHLORIDE: 102 meq/L (ref 96–112)
CO2: 32 mEq/L (ref 19–32)
Creatinine, Ser: 1.14 mg/dL (ref 0.40–1.50)
GFR: 67.02 mL/min (ref >60.00)
Glucose, Bld: 137 mg/dL — ABNORMAL HIGH (ref 70–99)
POTASSIUM: 4.6 meq/L (ref 3.5–5.1)
Sodium: 138 mEq/L (ref 135–145)
Total Bilirubin: 1.2 mg/dL (ref 0.2–1.2)
Total Protein: 7 g/dL (ref 6.0–8.3)

## 2015-01-18 LAB — LIPID PANEL
CHOLESTEROL: 145 mg/dL (ref 0–200)
HDL: 35.3 mg/dL — AB (ref >39.00)
NonHDL: 109.7
Total CHOL/HDL Ratio: 4
Triglycerides: 261 mg/dL — ABNORMAL HIGH (ref 0.0–149.0)
VLDL: 52.2 mg/dL — ABNORMAL HIGH (ref 0.0–40.0)

## 2015-01-18 LAB — LDL CHOLESTEROL, DIRECT: Direct LDL: 80 mg/dL

## 2015-01-18 NOTE — Patient Instructions (Signed)
Follow up in 3 months

## 2015-01-18 NOTE — Assessment & Plan Note (Signed)
Will check Lipids and LFTs with labs. Continue Crestor.

## 2015-01-18 NOTE — Progress Notes (Signed)
Pre visit review using our clinic review tool, if applicable. No additional management support is needed unless otherwise documented below in the visit note. 

## 2015-01-18 NOTE — Assessment & Plan Note (Signed)
BP Readings from Last 3 Encounters:  01/18/15 132/77  10/19/14 137/82  08/08/14 138/70   BP well controlled. Continue current medications. Renal function with labs.

## 2015-01-18 NOTE — Progress Notes (Signed)
Subjective:    Patient ID: Guy Phillips, male    DOB: 1941/04/25, 74 y.o.   MRN: [id number]  HPI  74YO male presents for follow up.  DM - BG well controlled. Some higher readings near 170s with eating grapes.  Mostly near 120. Compliant with medication.  No recent chest pain, dyspnea. Some itching at site of sternotomy.  Wt Readings from Last 3 Encounters:  01/18/15 238 lb 8 oz (108.183 kg)  10/19/14 238 lb 12 oz (108.296 kg)  08/08/14 230 lb (104.327 kg)    Past medical, surgical, family and social history per today's encounter.  Review of Systems  Constitutional: Negative for fever, chills, activity change, appetite change, fatigue and unexpected weight change.  Eyes: Negative for visual disturbance.  Respiratory: Negative for cough and shortness of breath.   Cardiovascular: Negative for chest pain, palpitations and leg swelling.  Gastrointestinal: Negative for abdominal pain and abdominal distention.  Genitourinary: Negative for dysuria, urgency and difficulty urinating.  Musculoskeletal: Negative for myalgias, arthralgias and gait problem.  Skin: Negative for color change and rash.  Hematological: Negative for adenopathy.  Psychiatric/Behavioral: Negative for sleep disturbance and dysphoric mood. The patient is not nervous/anxious.        Objective:    BP 132/77 mmHg  Pulse 69  Temp(Src) 98.2 F (36.8 C) (Oral)  Ht 5\' 8"  (1.727 m)  Wt 238 lb 8 oz (108.183 kg)  BMI 36.27 kg/m2  SpO2 97% Physical Exam  Constitutional: He is oriented to person, place, and time. He appears well-developed and well-nourished. No distress.  HENT:  Head: Normocephalic and atraumatic.  Right Ear: External ear normal.  Left Ear: External ear normal.  Nose: Nose normal.  Mouth/Throat: Oropharynx is clear and moist. No oropharyngeal exudate.  Eyes: Conjunctivae and EOM are normal. Pupils are equal, round, and reactive to light. Right eye exhibits no discharge. Left eye exhibits no  discharge. No scleral icterus.  Neck: Normal range of motion. Neck supple. No tracheal deviation present. No thyromegaly present.  Cardiovascular: Normal rate, regular rhythm and normal heart sounds.  Exam reveals no gallop and no friction rub.   No murmur heard. Pulmonary/Chest: Effort normal and breath sounds normal. No accessory muscle usage. No tachypnea. No respiratory distress. He has no decreased breath sounds. He has no wheezes. He has no rhonchi. He has no rales. He exhibits no tenderness.  Musculoskeletal: Normal range of motion. He exhibits no edema.  Lymphadenopathy:    He has no cervical adenopathy.  Neurological: He is alert and oriented to person, place, and time. No cranial nerve deficit. Coordination normal.  Skin: Skin is warm and dry. No rash noted. He is not diaphoretic. No erythema. No pallor.  Psychiatric: He has a normal mood and affect. His behavior is normal. Judgment and thought content normal.          Assessment & Plan:   Problem List Items Addressed This Visit      Unprioritized   Diabetes mellitus type 2 in obese - Primary    Will check A1c with labs. BG well controlled by report. Continue current medications.      Relevant Orders   Comprehensive metabolic panel   Hemoglobin A1c   Lipid panel   Microalbumin / creatinine urine ratio   Hyperlipidemia    Will check Lipids and LFTs with labs. Continue Crestor.      Hypertension    BP Readings from Last 3 Encounters:  01/18/15 132/77  10/19/14 137/82  08/08/14 138/70  BP well controlled. Continue current medications. Renal function with labs.      Obesity (BMI 30-39.9)    Wt Readings from Last 3 Encounters:  01/18/15 238 lb 8 oz (108.183 kg)  10/19/14 238 lb 12 oz (108.296 kg)  08/08/14 230 lb (104.327 kg)   Body mass index is 36.27 kg/(m^2). Encouraged healthy diet and exercise.          Return in about 3 months (around 04/19/2015).

## 2015-01-18 NOTE — Assessment & Plan Note (Signed)
Will check A1c with labs. BG well controlled by report. Continue current medications.

## 2015-01-18 NOTE — Assessment & Plan Note (Signed)
Wt Readings from Last 3 Encounters:  01/18/15 238 lb 8 oz (108.183 kg)  10/19/14 238 lb 12 oz (108.296 kg)  08/08/14 230 lb (104.327 kg)   Body mass index is 36.27 kg/(m^2). Encouraged healthy diet and exercise.

## 2015-01-18 NOTE — Telephone Encounter (Signed)
Pt dropped off blood sugar readings. List ing Dr. Thomes Dinning box/msn

## 2015-01-19 ENCOUNTER — Telehealth: Payer: Self-pay | Admitting: Internal Medicine

## 2015-01-19 DIAGNOSIS — I63412 Cerebral infarction due to embolism of left middle cerebral artery: Secondary | ICD-10-CM | POA: Diagnosis not present

## 2015-01-19 DIAGNOSIS — I2510 Atherosclerotic heart disease of native coronary artery without angina pectoris: Secondary | ICD-10-CM | POA: Diagnosis not present

## 2015-01-19 DIAGNOSIS — I059 Rheumatic mitral valve disease, unspecified: Secondary | ICD-10-CM | POA: Diagnosis not present

## 2015-01-19 DIAGNOSIS — Z952 Presence of prosthetic heart valve: Secondary | ICD-10-CM | POA: Diagnosis not present

## 2015-02-08 DIAGNOSIS — M9902 Segmental and somatic dysfunction of thoracic region: Secondary | ICD-10-CM | POA: Diagnosis not present

## 2015-02-08 DIAGNOSIS — M62830 Muscle spasm of back: Secondary | ICD-10-CM | POA: Diagnosis not present

## 2015-02-08 DIAGNOSIS — M5416 Radiculopathy, lumbar region: Secondary | ICD-10-CM | POA: Diagnosis not present

## 2015-02-08 DIAGNOSIS — M9903 Segmental and somatic dysfunction of lumbar region: Secondary | ICD-10-CM | POA: Diagnosis not present

## 2015-03-08 DIAGNOSIS — M9902 Segmental and somatic dysfunction of thoracic region: Secondary | ICD-10-CM | POA: Diagnosis not present

## 2015-03-08 DIAGNOSIS — M62830 Muscle spasm of back: Secondary | ICD-10-CM | POA: Diagnosis not present

## 2015-03-08 DIAGNOSIS — M9903 Segmental and somatic dysfunction of lumbar region: Secondary | ICD-10-CM | POA: Diagnosis not present

## 2015-03-08 DIAGNOSIS — M5416 Radiculopathy, lumbar region: Secondary | ICD-10-CM | POA: Diagnosis not present

## 2015-03-09 NOTE — Telephone Encounter (Signed)
I am closing this encounter since it is from 11/11/12. I am sure it has been taking care of since it is now 2016.

## 2015-03-14 DIAGNOSIS — L570 Actinic keratosis: Secondary | ICD-10-CM | POA: Diagnosis not present

## 2015-03-14 DIAGNOSIS — R52 Pain, unspecified: Secondary | ICD-10-CM | POA: Diagnosis not present

## 2015-04-05 DIAGNOSIS — M5416 Radiculopathy, lumbar region: Secondary | ICD-10-CM | POA: Diagnosis not present

## 2015-04-05 DIAGNOSIS — M9903 Segmental and somatic dysfunction of lumbar region: Secondary | ICD-10-CM | POA: Diagnosis not present

## 2015-04-05 DIAGNOSIS — M62830 Muscle spasm of back: Secondary | ICD-10-CM | POA: Diagnosis not present

## 2015-04-05 DIAGNOSIS — M9902 Segmental and somatic dysfunction of thoracic region: Secondary | ICD-10-CM | POA: Diagnosis not present

## 2015-04-17 DIAGNOSIS — L570 Actinic keratosis: Secondary | ICD-10-CM | POA: Diagnosis not present

## 2015-04-17 DIAGNOSIS — D235 Other benign neoplasm of skin of trunk: Secondary | ICD-10-CM | POA: Diagnosis not present

## 2015-04-18 ENCOUNTER — Ambulatory Visit (INDEPENDENT_AMBULATORY_CARE_PROVIDER_SITE_OTHER): Payer: Medicare Other | Admitting: Internal Medicine

## 2015-04-18 ENCOUNTER — Encounter: Payer: Self-pay | Admitting: Internal Medicine

## 2015-04-18 VITALS — BP 137/67 | HR 68 | Temp 98.3°F | Ht 68.0 in | Wt 236.1 lb

## 2015-04-18 DIAGNOSIS — E119 Type 2 diabetes mellitus without complications: Secondary | ICD-10-CM | POA: Diagnosis not present

## 2015-04-18 DIAGNOSIS — E785 Hyperlipidemia, unspecified: Secondary | ICD-10-CM

## 2015-04-18 DIAGNOSIS — I10 Essential (primary) hypertension: Secondary | ICD-10-CM

## 2015-04-18 DIAGNOSIS — I2510 Atherosclerotic heart disease of native coronary artery without angina pectoris: Secondary | ICD-10-CM

## 2015-04-18 DIAGNOSIS — E669 Obesity, unspecified: Secondary | ICD-10-CM | POA: Diagnosis not present

## 2015-04-18 DIAGNOSIS — E1169 Type 2 diabetes mellitus with other specified complication: Secondary | ICD-10-CM

## 2015-04-18 LAB — LIPID PANEL
CHOL/HDL RATIO: 4
Cholesterol: 139 mg/dL (ref 0–200)
HDL: 32.5 mg/dL — ABNORMAL LOW (ref >39.00)
NONHDL: 106.5
Triglycerides: 270 mg/dL — ABNORMAL HIGH (ref 0.0–149.0)
VLDL: 54 mg/dL — ABNORMAL HIGH (ref 0.0–40.0)

## 2015-04-18 LAB — LDL CHOLESTEROL, DIRECT: LDL DIRECT: 78 mg/dL

## 2015-04-18 LAB — MICROALBUMIN / CREATININE URINE RATIO
Creatinine,U: 138.8 mg/dL
Microalb Creat Ratio: 0.5 mg/g (ref 0.0–30.0)

## 2015-04-18 LAB — HEMOGLOBIN A1C: HEMOGLOBIN A1C: 6.6 % — AB (ref 4.6–6.5)

## 2015-04-18 NOTE — Assessment & Plan Note (Addendum)
BP Readings from Last 3 Encounters:  04/18/15 137/67  01/18/15 132/77  10/19/14 137/82   BP well controlled. Renal function with labs. Continue current medications. Note that Quinapril had been stopped in past after AV replacement.

## 2015-04-18 NOTE — Progress Notes (Signed)
Subjective:    Patient ID: Guy Phillips, male    DOB: Mar 16, 1941, 74 y.o.   MRN: [id number]  HPI  74YO male presents for follow up.  DM - BG running in low 100s mostly. Compliant with medications.  No chest pain.  No new concerns today.   BP Readings from Last 3 Encounters:  04/18/15 137/67  01/18/15 132/77  10/19/14 137/82     Wt Readings from Last 3 Encounters:  04/18/15 236 lb 2 oz (107.106 kg)  01/18/15 238 lb 8 oz (108.183 kg)  10/19/14 238 lb 12 oz (108.296 kg)     Past medical, surgical, family and social history per today's encounter.  Review of Systems  Constitutional: Negative for fever, chills, activity change, appetite change, fatigue and unexpected weight change.  Eyes: Negative for visual disturbance.  Respiratory: Negative for cough and shortness of breath.   Cardiovascular: Negative for chest pain, palpitations and leg swelling.  Gastrointestinal: Negative for nausea, vomiting, abdominal pain, diarrhea, constipation and abdominal distention.  Genitourinary: Negative for dysuria, urgency and difficulty urinating.  Musculoskeletal: Negative for arthralgias and gait problem.  Skin: Negative for color change and rash.  Hematological: Negative for adenopathy.  Psychiatric/Behavioral: Negative for sleep disturbance and dysphoric mood. The patient is not nervous/anxious.        Objective:    BP 137/67 mmHg  Pulse 68  Temp(Src) 98.3 F (36.8 C) (Oral)  Ht 5\' 8"  (1.727 m)  Wt 236 lb 2 oz (107.106 kg)  BMI 35.91 kg/m2  SpO2 96% Physical Exam  Constitutional: He is oriented to person, place, and time. He appears well-developed and well-nourished. No distress.  HENT:  Head: Normocephalic and atraumatic.  Right Ear: External ear normal.  Left Ear: External ear normal.  Nose: Nose normal.  Mouth/Throat: Oropharynx is clear and moist. No oropharyngeal exudate.  Eyes: Conjunctivae and EOM are normal. Pupils are equal, round, and reactive to  light. Right eye exhibits no discharge. Left eye exhibits no discharge. No scleral icterus.  Neck: Normal range of motion. Neck supple. No tracheal deviation present. No thyromegaly present.  Cardiovascular: Normal rate and regular rhythm.  Exam reveals no gallop and no friction rub.   Murmur (loud S2) heard. Pulmonary/Chest: Effort normal and breath sounds normal. No accessory muscle usage. No tachypnea. No respiratory distress. He has no decreased breath sounds. He has no wheezes. He has no rhonchi. He has no rales. He exhibits no tenderness.  Musculoskeletal: Normal range of motion. He exhibits no edema.  Lymphadenopathy:    He has no cervical adenopathy.  Neurological: He is alert and oriented to person, place, and time. No cranial nerve deficit. Coordination normal.  Skin: Skin is warm and dry. No rash noted. He is not diaphoretic. No erythema. No pallor.  Psychiatric: He has a normal mood and affect. His behavior is normal. Judgment and thought content normal.          Assessment & Plan:   Problem List Items Addressed This Visit      Unprioritized   Diabetes mellitus type 2 in obese - Primary    BG well controlled. Continue current medications. A1c with labs.      Relevant Orders   Comprehensive metabolic panel   Hemoglobin A1c   Lipid panel   Microalbumin / creatinine urine ratio   Hyperlipidemia    Will check lipids with labs. Continue Crestor.      Hypertension    BP Readings from Last 3 Encounters:  04/18/15 137/67  01/18/15 132/77  10/19/14 137/82   BP well controlled. Renal function with labs. Continue current medications. Note that Quinapril had been stopped in past after AV replacement.      Obesity (BMI 30-39.9)    Wt Readings from Last 3 Encounters:  04/18/15 236 lb 2 oz (107.106 kg)  01/18/15 238 lb 8 oz (108.183 kg)  10/19/14 238 lb 12 oz (108.296 kg)   Body mass index is 35.91 kg/(m^2). Encouraged healthy diet and exercise.          Return  in about 3 months (around 07/19/2015) for Recheck of Diabetes.

## 2015-04-18 NOTE — Assessment & Plan Note (Signed)
BG well controlled. Continue current medications. A1c with labs.

## 2015-04-18 NOTE — Progress Notes (Signed)
Pre visit review using our clinic review tool, if applicable. No additional management support is needed unless otherwise documented below in the visit note. 

## 2015-04-18 NOTE — Patient Instructions (Signed)
Labs today.   Follow up in 3 months.  

## 2015-04-18 NOTE — Assessment & Plan Note (Signed)
Wt Readings from Last 3 Encounters:  04/18/15 236 lb 2 oz (107.106 kg)  01/18/15 238 lb 8 oz (108.183 kg)  10/19/14 238 lb 12 oz (108.296 kg)   Body mass index is 35.91 kg/(m^2). Encouraged healthy diet and exercise.

## 2015-04-18 NOTE — Assessment & Plan Note (Signed)
Will check lipids with labs. Continue Crestor. 

## 2015-04-19 LAB — COMPREHENSIVE METABOLIC PANEL
ALT: 13 U/L (ref 0–53)
AST: 16 U/L (ref 0–37)
Albumin: 4.5 g/dL (ref 3.5–5.2)
Alkaline Phosphatase: 67 U/L (ref 39–117)
BUN: 15 mg/dL (ref 6–23)
CHLORIDE: 101 meq/L (ref 96–112)
CO2: 30 mEq/L (ref 19–32)
Calcium: 10 mg/dL (ref 8.4–10.5)
Creatinine, Ser: 1.09 mg/dL (ref 0.40–1.50)
GFR: 70.53 mL/min (ref >60.00)
Glucose, Bld: 116 mg/dL — ABNORMAL HIGH (ref 70–99)
Potassium: 4.5 mEq/L (ref 3.5–5.1)
Sodium: 139 mEq/L (ref 135–145)
Total Bilirubin: 0.9 mg/dL (ref 0.2–1.2)
Total Protein: 7.2 g/dL (ref 6.0–8.3)

## 2015-04-24 ENCOUNTER — Ambulatory Visit (INDEPENDENT_AMBULATORY_CARE_PROVIDER_SITE_OTHER): Payer: Medicare Other

## 2015-04-24 VITALS — BP 138/72 | HR 78 | Temp 97.4°F | Resp 14 | Ht 68.0 in | Wt 235.6 lb

## 2015-04-24 DIAGNOSIS — Z0000 Encounter for general adult medical examination without abnormal findings: Secondary | ICD-10-CM

## 2015-04-24 NOTE — Progress Notes (Signed)
Subjective:   Guy Phillips is a 74 y.o. male who presents for Medicare Annual/Subsequent preventive examination.  Review of Systems:  No ROS.  Medicare Wellness Visit.  Cardiac Risk Factors include: advanced age (>70men, >5 women);male gender;hypertension;diabetes mellitus     Objective:    The goal of the wellness visit is to assist the patient how to close the gaps in care and create a preventative care plan for the patient.   itals: BP 138/72 mmHg  Pulse 78  Temp(Src) 97.4 F (36.3 C) (Oral)  Resp 14  Ht 5\' 8"  (1.727 m)  Wt 235 lb 9.6 oz (106.867 kg)  BMI 35.83 kg/m2  SpO2 96%  Tobacco History  Smoking status  . Former Smoker  . Quit date: 07/03/1991  Smokeless tobacco  . Never Used     Counseling given: Not Answered   Past Medical History  Diagnosis Date  . Hypertension   . Hyperlipidemia   . CAD (coronary artery disease)     s/p MI 2011 Cath showed 100% occlusion of RCA with collaterals open, followed by Dr. Ubaldo Glassing  . Hernia     Umbilical  . Kidney stone     2008  . Diabetes mellitus Dx 2005    HgbA1c 6.8%, Optho [facility] 2001  . 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 Evorn Gong   Past Surgical History  Procedure Laterality Date  . Functional endoscopic sinus surgery      Dr. Fatima Sanger in [place]  . Coronary artery bypass graft  2015    with AVR   History reviewed. No pertinent family history. History  Sexual Activity  . Sexual Activity: No    Outpatient Encounter Prescriptions as of 04/24/2015  Medication Sig  . acetaminophen (TYLENOL) 325 MG tablet Take by mouth.  Marland Kitchen aspirin 81 MG tablet Take 81 mg by mouth daily.    . Cholecalciferol (VITAMIN D3) 2000 UNITS TABS Take 1 tablet by mouth daily.    . CRESTOR 20 MG tablet TAKE 1 TABLET DAILY  . fexofenadine (ALLEGRA) 180 MG tablet Take 180 mg by mouth daily.  . fluticasone (FLONASE) 50 MCG/ACT nasal spray Place into the nose Daily.  Marland Kitchen glipiZIDE (GLUCOTROL) 5 MG tablet TAKE 1  TABLET BY MOUTH TWICE A DAY BEFORE MEALS  . metFORMIN (GLUCOPHAGE) 1000 MG tablet TAKE 1 TABLET TWICE DAILY  WITH MEALS  . metoprolol tartrate (LOPRESSOR) 25 MG tablet Take 12.5 mg by mouth. Take 1/2 tablet daily  . Multiple Vitamin (MULTIVITAMIN) tablet Take 1 tablet by mouth daily.    . nitroGLYCERIN (NITROSTAT) 0.4 MG SL tablet Place 0.4 mg under the tongue every 5 (five) minutes as needed.  . senna-docusate (SENOKOT-S) 8.6-50 MG per tablet Take by mouth.  . triamterene-hydrochlorothiazide (DYAZIDE) 50-25 MG per capsule Take 1 capsule by mouth. daily  . [DISCONTINUED] fluticasone (FLONASE) 50 MCG/ACT nasal spray Place into the nose.  . fesoterodine (TOVIAZ) 8 MG TB24 Take 1 tablet (8 mg total) by mouth daily. (Patient not taking: Reported on 04/24/2015)   No facility-administered encounter medications on file as of 04/24/2015.    Activities of Daily Living In your present state of health, do you have any difficulty performing the following activities: 04/24/2015  Hearing? N  Vision? N  Difficulty concentrating or making decisions? (No Data)  Walking or climbing stairs? N  Dressing or bathing? N  Doing errands, shopping? N  Preparing Food and eating ? N  Using the Toilet? N  In the past six months, have you accidently leaked urine? N  Do you have problems with loss of bowel control? N  Managing your Medications? N  Managing your Finances? N  Housekeeping or managing your Housekeeping? N    Patient Care Team: Jackolyn Confer, MD as PCP - General (Internal Medicine)   Assessment:     Taking meds without issues; no barriers identified.  No Risk for hepatitis or high risk social behavior identified via hepatitis screen.  Functional status reviewed; no losses in function x 1 year.  Pt reports bladder no longer leaking. Urgency at times.  Safety issues reviewed-Smoke detectors in the home. Wears seatbelt when driving or riding with others. Firearms in locked area. No violence in the  home.  End of life planning was discussed; copy of  HCPOA requested.   Exercise Activities and Dietary recommendations Current Exercise Habits:: Structured exercise class, Type of exercise: walking;treadmill, Time (Minutes): 60, Frequency (Times/Week): 3, Weekly Exercise (Minutes/Week): 180, Intensity: Moderate  Goals    . Eat more fruits and vegetables     Maintain weight by eating more fruits and vegetables.  Continue work out regiment.       Fall Risk Fall Risk  04/24/2015 10/19/2014  Falls in the past year? No No   Depression Screen PHQ 2/9 Scores 04/24/2015 10/19/2014 07/29/2012  PHQ - 2 Score 0 0 0    Cognitive Testing MMSE - Mini Mental State Exam 04/24/2015  Orientation to time 5  Orientation to Place 5  Registration 3  Attention/ Calculation 5  Recall 3  Language- name 2 objects 2  Language- repeat 1  Language- follow 3 step command 3  Language- read & follow direction 1  Write a sentence 1  Copy design 1  Total score 30    Immunization History  Administered Date(s) Administered  . Influenza Split 07/03/2011, 06/16/2012  . Influenza,inj,Quad PF,36+ Mos 06/16/2013, 07/01/2014  . Pneumococcal Conjugate-13 12/28/2013  . Pneumococcal Polysaccharide-23 10/08/2011  . Tdap 10/08/2011  . Zoster 07/29/2008   Screening Tests Health Maintenance  Topic Date Due  . INFLUENZA VACCINE  06/24/2015 (Originally 04/24/2015)  . OPHTHALMOLOGY EXAM  10/06/2015  . HEMOGLOBIN A1C  10/19/2015  . FOOT EXAM  10/20/2015  . URINE MICROALBUMIN  04/17/2016  . COLONOSCOPY  07/29/2018  . TETANUS/TDAP  10/07/2021  . ZOSTAVAX  Completed  . PNA vac Low Risk Adult  Completed      Plan:    During the course of the visit the patient was educated and counseled about the following appropriate screening and preventive services:   Vaccines to include Pneumoccal, Influenza, Hepatitis B, Td, Zostavax, HCV  Electrocardiogram  Cardiovascular Disease  Colorectal cancer screening  Diabetes  screening  Prostate Cancer Screening  Glaucoma screening  Nutrition counseling   Smoking cessation counseling  End of life planning was discussed; copy of  HCPOA requested.  Influenza vaccine at follow up.  Patient Instructions (the written plan) was given to the patient.    Varney Biles, LPN  0/11/90

## 2015-04-24 NOTE — Patient Instructions (Addendum)
Guy Phillips,  Thank you for taking time to come for your Medicare Wellness Visit.  I appreciate your ongoing commitment to your health goals. Please review the following plan we discussed and let me know if I can assist you in the future.  Bring a copy of [place] [employer] paperwork to be scanned into your health record.  Influenza vaccine at follow up visit.  Fat and Cholesterol Control Diet Fat and cholesterol levels in your blood and organs are influenced by your diet. High levels of fat and cholesterol may lead to diseases of the heart, small and large blood vessels, gallbladder, liver, and pancreas. CONTROLLING FAT AND CHOLESTEROL WITH DIET Although exercise and lifestyle factors are important, your diet is key. That is because certain foods are known to raise cholesterol and others to lower it. The goal is to balance foods for their effect on cholesterol and more importantly, to replace saturated and trans fat with other types of fat, such as monounsaturated fat, polyunsaturated fat, and omega-3 fatty acids. On average, a person should consume no more than 15 to 17 g of saturated fat daily. Saturated and trans fats are considered "bad" fats, and they will raise LDL cholesterol. Saturated fats are primarily found in animal products such as meats, butter, and cream. However, that does not mean you need to give up all your favorite foods. Today, there are good tasting, low-fat, low-cholesterol substitutes for most of the things you like to eat. Choose low-fat or nonfat alternatives. Choose round or loin cuts of red meat. These types of cuts are lowest in fat and cholesterol. Chicken (without the skin), fish, veal, and ground [place] breast are great choices. Eliminate fatty meats, such as hot dogs and salami. Even shellfish have little or no saturated fat. Have a 3 oz (85 g) portion when you eat lean meat, poultry, or fish. Trans fats are also called "partially hydrogenated oils." They  are oils that have been scientifically manipulated so that they are solid at room temperature resulting in a longer shelf life and improved taste and texture of foods in which they are added. Trans fats are found in stick margarine, some tub margarines, cookies, crackers, and baked goods.  When baking and cooking, oils are a great substitute for butter. The monounsaturated oils are especially beneficial since it is believed they lower LDL and raise HDL. The oils you should avoid entirely are saturated tropical oils, such as coconut and palm.  Remember to eat a lot from food groups that are naturally free of saturated and trans fat, including fish, fruit, vegetables, beans, grains (barley, rice, couscous, bulgur wheat), and pasta (without cream sauces).  IDENTIFYING FOODS THAT LOWER FAT AND CHOLESTEROL  Soluble fiber may lower your cholesterol. This type of fiber is found in fruits such as apples, vegetables such as broccoli, potatoes, and carrots, legumes such as beans, peas, and lentils, and grains such as barley. Foods fortified with plant sterols (phytosterol) may also lower cholesterol. You should eat at least 2 g per day of these foods for a cholesterol lowering effect.  Read package labels to identify low-saturated fats, trans fat free, and low-fat foods at the supermarket. Select cheeses that have only 2 to 3 g saturated fat per ounce. Use a heart-healthy tub margarine that is free of trans fats or partially hydrogenated oil. When buying baked goods (cookies, crackers), avoid partially hydrogenated oils. Breads and muffins should be made from whole grains (whole-wheat or whole oat flour, instead of "  flour" or "enriched flour"). Buy non-creamy canned soups with reduced salt and no added fats.  FOOD PREPARATION TECHNIQUES  Never deep-fry. If you must fry, either stir-fry, which uses very little fat, or use non-stick cooking sprays. When possible, broil, bake, or roast meats, and steam vegetables. Instead  of putting butter or margarine on vegetables, use lemon and herbs, applesauce, and cinnamon (for squash and sweet potatoes). Use nonfat yogurt, salsa, and low-fat dressings for salads.  LOW-SATURATED FAT / LOW-FAT FOOD SUBSTITUTES Meats / Saturated Fat (g)  Avoid: Steak, marbled (3 oz/85 g) / 11 g  Choose: Steak, lean (3 oz/85 g) / 4 g  Avoid: Hamburger (3 oz/85 g) / 7 g  Choose: Hamburger, lean (3 oz/85 g) / 5 g  Avoid: Ham (3 oz/85 g) / 6 g  Choose: Ham, lean cut (3 oz/85 g) / 2.4 g  Avoid: Chicken, with skin, dark meat (3 oz/85 g) / 4 g  Choose: Chicken, skin removed, dark meat (3 oz/85 g) / 2 g  Avoid: Chicken, with skin, light meat (3 oz/85 g) / 2.5 g  Choose: Chicken, skin removed, light meat (3 oz/85 g) / 1 g Dairy / Saturated Fat (g)  Avoid: Whole milk (1 cup) / 5 g  Choose: Low-fat milk, 2% (1 cup) / 3 g  Choose: Low-fat milk, 1% (1 cup) / 1.5 g  Choose: Skim milk (1 cup) / 0.3 g  Avoid: Hard cheese (1 oz/28 g) / 6 g  Choose: Skim milk cheese (1 oz/28 g) / 2 to 3 g  Avoid: Cottage cheese, 4% fat (1 cup) / 6.5 g  Choose: Low-fat cottage cheese, 1% fat (1 cup) / 1.5 g  Avoid: Ice cream (1 cup) / 9 g  Choose: Sherbet (1 cup) / 2.5 g  Choose: Nonfat frozen yogurt (1 cup) / 0.3 g  Choose: Frozen fruit bar / trace  Avoid: Whipped cream (1 tbs) / 3.5 g  Choose: Nondairy whipped topping (1 tbs) / 1 g Condiments / Saturated Fat (g)  Avoid: Mayonnaise (1 tbs) / 2 g  Choose: Low-fat mayonnaise (1 tbs) / 1 g  Avoid: Butter (1 tbs) / 7 g  Choose: Extra light margarine (1 tbs) / 1 g  Avoid: Coconut oil (1 tbs) / 11.8 g  Choose: Olive oil (1 tbs) / 1.8 g  Choose: Corn oil (1 tbs) / 1.7 g  Choose: Safflower oil (1 tbs) / 1.2 g  Choose: Sunflower oil (1 tbs) / 1.4 g  Choose: Soybean oil (1 tbs) / 2.4 g  Choose: Canola oil (1 tbs) / 1 g Document Released: 09/09/2005 Document Revised: 01/04/2013 Document Reviewed: 12/08/2013 [employer] Patient  Information 2015 [place], [place]. This information is not intended to replace advice given to you by your health care provider. Make sure you discuss any questions you have with your health care provider.  Diabetes and Foot Care Diabetes may cause you to have problems because of poor blood supply (circulation) to your feet and legs. This may cause the skin on your feet to become thinner, break easier, and heal more slowly. Your skin may become dry, and the skin may peel and crack. You may also have nerve damage in your legs and feet causing decreased feeling in them. You may not notice minor injuries to your feet that could lead to infections or more serious problems. Taking care of your feet is one of the most important things you can do for yourself.  HOME CARE INSTRUCTIONS  Wear  shoes at all times, even in the house. Do not go barefoot. Bare feet are easily injured.  Check your feet daily for blisters, cuts, and redness. If you cannot see the bottom of your feet, use a mirror or ask someone for help.  Wash your feet with warm water (do not use hot water) and mild soap. Then pat your feet and the areas between your toes until they are completely dry. Do not soak your feet as this can dry your skin.  Apply a moisturizing lotion or petroleum jelly (that does not contain alcohol and is unscented) to the skin on your feet and to dry, brittle toenails. Do not apply lotion between your toes.  Trim your toenails straight across. Do not dig under them or around the cuticle. File the edges of your nails with an emery board or nail file.  Do not cut corns or calluses or try to remove them with medicine.  Wear clean socks or stockings every day. Make sure they are not too tight. Do not wear knee-high stockings since they may decrease blood flow to your legs.  Wear shoes that fit properly and have enough cushioning. To break in new shoes, wear them for just a few hours a day. This prevents you from injuring  your feet. Always look in your shoes before you put them on to be sure there are no objects inside.  Do not cross your legs. This may decrease the blood flow to your feet.  If you find a minor scrape, cut, or break in the skin on your feet, keep it and the skin around it clean and dry. These areas may be cleansed with mild soap and water. Do not cleanse the area with peroxide, alcohol, or iodine.  When you remove an adhesive bandage, be sure not to damage the skin around it.  If you have a wound, look at it several times a day to make sure it is healing.  Do not use heating pads or hot water bottles. They may burn your skin. If you have lost feeling in your feet or legs, you may not know it is happening until it is too late.  Make sure your health care provider performs a complete foot exam at least annually or more often if you have foot problems. Report any cuts, sores, or bruises to your health care provider immediately. SEEK MEDICAL CARE IF:   You have an injury that is not healing.  You have cuts or breaks in the skin.  You have an ingrown nail.  You notice redness on your legs or feet.  You feel burning or tingling in your legs or feet.  You have pain or cramps in your legs and feet.  Your legs or feet are numb.  Your feet always feel cold. SEEK IMMEDIATE MEDICAL CARE IF:   There is increasing redness, swelling, or pain in or around a wound.  There is a red line that goes up your leg.  Pus is coming from a wound.  You develop a fever or as directed by your health care provider.  You notice a bad smell coming from an ulcer or wound. Document Released: 09/06/2000 Document Revised: 05/12/2013 Document Reviewed: 02/16/2013 [facility] Patient Information 2015 [place], [place]. This information is not intended to replace advice given to you by your health care provider. Make sure you discuss any questions you have with your health care provider.  Glaucoma Glaucoma happens  when the fluid pressure in the eyeball is  too high. The pressure cannot stay high for too long, or the eyeball may become damaged. Signs of glaucoma include:  Having a hard time seeing in a dark room after being in a bright one.  Having trouble seeing things out to the sides of you.  Blurry sight.  Seeing bright white lights or colors in front of your eyes.  Headaches.  Feeling sick to your stomach (nauseous) or throwing up (vomiting).  Sudden vision loss. Glaucoma testing is an important part of taking care of your eyesight. HOME CARE  Always use your eyedrops or pills as told by your doctor. Do not run out.  Do not go away from home without your eyedrops or pills.  Keep your appointments.  Always tell a new doctor that you have glaucoma and how long you have had it. Tell the doctor about the eyedrops and pills you take.  Do not use eyedrops or pills that have not been prescribed by your doctor. GET HELP RIGHT AWAY IF:  You develop severe pain in the affected eye.  You develop vision problems.  You develop a bad headache in the area around the eye.  You feel sick to your stomach or throw up.  You start to have problems with the other eye. MAKE SURE YOU:   Understand these instructions.  Will watch your condition.  Will get help right away if you are not doing well or get worse. Document Released: 06/18/2008 Document Revised: 12/02/2011 Document Reviewed: 06/18/2008 [facility] Patient Information 2015 [place], [place]. This information is not intended to replace advice given to you by your health care provider. Make sure you discuss any questions you have with your health care provider.  Hearing Loss A hearing loss is sometimes called deafness. Hearing loss may be partial or total. CAUSES Hearing loss may be caused by:  Wax in the ear canal.  Infection of the ear canal.  Infection of the middle ear.  Trauma to the ear or surrounding area.  Fluid in the middle  ear.  A hole in the eardrum (perforated eardrum).  Exposure to loud sounds or music.  Problems with the hearing nerve.  Certain medications. Hearing loss without wax, infection, or a history of injury may mean that the nerve is involved. Hearing loss with severe dizziness, nausea and vomiting or ringing in the ear may suggest a hearing nerve irritation or problems in the middle or inner ear. If hearing loss is untreated, there is a greater likelihood for residual or permanent hearing loss. DIAGNOSIS A hearing test (audiometry) assesses hearing loss. The audiometry test needs to be performed by a hearing specialist (audiologist). TREATMENT Treatment for recent onset of hearing loss may include:  Ear wax removal.  Medications that kill germs (antibiotics).  Cortisone medications.  Prompt follow up with the appropriate specialist. Return of hearing depends on the cause of your hearing loss, so proper medical follow-up is important. Some hearing loss may not be reversible, and a caregiver should discuss care and treatment options with you. SEEK MEDICAL CARE IF:   You have a severe headache, dizziness, or changes in vision.  You have new or increased weakness.  You develop repeated vomiting or other serious medical problems.  You have a fever. Document Released: 09/09/2005 Document Revised: 12/02/2011 Document Reviewed: 01/04/2010 [facility] Patient Information 2015 [place], [place]. This information is not intended to replace advice given to you by your health care provider. Make sure you discuss any questions you have with your health care provider.

## 2015-04-24 NOTE — Progress Notes (Signed)
Annual Wellness Visit as completed by [facility] Coach was reviewed in full.  

## 2015-05-03 DIAGNOSIS — M9902 Segmental and somatic dysfunction of thoracic region: Secondary | ICD-10-CM | POA: Diagnosis not present

## 2015-05-03 DIAGNOSIS — M5416 Radiculopathy, lumbar region: Secondary | ICD-10-CM | POA: Diagnosis not present

## 2015-05-03 DIAGNOSIS — M9903 Segmental and somatic dysfunction of lumbar region: Secondary | ICD-10-CM | POA: Diagnosis not present

## 2015-05-03 DIAGNOSIS — M62830 Muscle spasm of back: Secondary | ICD-10-CM | POA: Diagnosis not present

## 2015-05-07 ENCOUNTER — Other Ambulatory Visit: Payer: Self-pay | Admitting: Internal Medicine

## 2015-05-31 DIAGNOSIS — M9903 Segmental and somatic dysfunction of lumbar region: Secondary | ICD-10-CM | POA: Diagnosis not present

## 2015-05-31 DIAGNOSIS — M62830 Muscle spasm of back: Secondary | ICD-10-CM | POA: Diagnosis not present

## 2015-05-31 DIAGNOSIS — M5416 Radiculopathy, lumbar region: Secondary | ICD-10-CM | POA: Diagnosis not present

## 2015-05-31 DIAGNOSIS — M9902 Segmental and somatic dysfunction of thoracic region: Secondary | ICD-10-CM | POA: Diagnosis not present

## 2015-06-01 ENCOUNTER — Other Ambulatory Visit: Payer: Self-pay | Admitting: *Deleted

## 2015-06-01 MED ORDER — ROSUVASTATIN CALCIUM 20 MG PO TABS: 20.0000 mg | ORAL_TABLET | Freq: Every day | ORAL | Status: DC

## 2015-06-01 MED ORDER — METFORMIN HCL 1000 MG PO TABS: ORAL_TABLET | ORAL | Status: DC

## 2015-06-28 ENCOUNTER — Ambulatory Visit (INDEPENDENT_AMBULATORY_CARE_PROVIDER_SITE_OTHER): Payer: Medicare Other

## 2015-06-28 DIAGNOSIS — Z23 Encounter for immunization: Secondary | ICD-10-CM | POA: Diagnosis not present

## 2015-06-30 DIAGNOSIS — M5416 Radiculopathy, lumbar region: Secondary | ICD-10-CM | POA: Diagnosis not present

## 2015-06-30 DIAGNOSIS — M9903 Segmental and somatic dysfunction of lumbar region: Secondary | ICD-10-CM | POA: Diagnosis not present

## 2015-06-30 DIAGNOSIS — M9902 Segmental and somatic dysfunction of thoracic region: Secondary | ICD-10-CM | POA: Diagnosis not present

## 2015-06-30 DIAGNOSIS — M62830 Muscle spasm of back: Secondary | ICD-10-CM | POA: Diagnosis not present

## 2015-07-21 ENCOUNTER — Encounter: Payer: Self-pay | Admitting: Internal Medicine

## 2015-07-21 ENCOUNTER — Ambulatory Visit (INDEPENDENT_AMBULATORY_CARE_PROVIDER_SITE_OTHER): Payer: Medicare Other | Admitting: Internal Medicine

## 2015-07-21 VITALS — BP 128/71 | HR 61 | Temp 98.0°F | Ht 68.0 in | Wt 233.1 lb

## 2015-07-21 DIAGNOSIS — E785 Hyperlipidemia, unspecified: Secondary | ICD-10-CM

## 2015-07-21 DIAGNOSIS — I10 Essential (primary) hypertension: Secondary | ICD-10-CM

## 2015-07-21 DIAGNOSIS — E669 Obesity, unspecified: Secondary | ICD-10-CM | POA: Diagnosis not present

## 2015-07-21 DIAGNOSIS — E1169 Type 2 diabetes mellitus with other specified complication: Secondary | ICD-10-CM

## 2015-07-21 DIAGNOSIS — E119 Type 2 diabetes mellitus without complications: Secondary | ICD-10-CM | POA: Diagnosis not present

## 2015-07-21 DIAGNOSIS — I2510 Atherosclerotic heart disease of native coronary artery without angina pectoris: Secondary | ICD-10-CM

## 2015-07-21 LAB — COMPREHENSIVE METABOLIC PANEL
ALK PHOS: 64 U/L (ref 39–117)
ALT: 14 U/L (ref 0–53)
AST: 15 U/L (ref 0–37)
Albumin: 4.3 g/dL (ref 3.5–5.2)
BUN: 21 mg/dL (ref 6–23)
CO2: 29 meq/L (ref 19–32)
Calcium: 10.1 mg/dL (ref 8.4–10.5)
Chloride: 102 mEq/L (ref 96–112)
Creatinine, Ser: 1.02 mg/dL (ref 0.40–1.50)
GFR: 76.09 mL/min (ref >60.00)
Glucose, Bld: 69 mg/dL — ABNORMAL LOW (ref 70–99)
Potassium: 4.4 mEq/L (ref 3.5–5.1)
Sodium: 141 mEq/L (ref 135–145)
TOTAL PROTEIN: 7.1 g/dL (ref 6.0–8.3)
Total Bilirubin: 0.8 mg/dL (ref 0.2–1.2)

## 2015-07-21 LAB — LIPID PANEL
Cholesterol: 143 mg/dL (ref 0–200)
HDL: 36.9 mg/dL — ABNORMAL LOW (ref >39.00)
NonHDL: 106.33
TRIGLYCERIDES: 244 mg/dL — AB (ref 0.0–149.0)
Total CHOL/HDL Ratio: 4
VLDL: 48.8 mg/dL — ABNORMAL HIGH (ref 0.0–40.0)

## 2015-07-21 LAB — MICROALBUMIN / CREATININE URINE RATIO
CREATININE, U: 92.3 mg/dL
Microalb Creat Ratio: 0.8 mg/g (ref 0.0–30.0)

## 2015-07-21 LAB — LDL CHOLESTEROL, DIRECT: LDL DIRECT: 83 mg/dL

## 2015-07-21 LAB — HEMOGLOBIN A1C: HEMOGLOBIN A1C: 6.6 % — AB (ref 4.6–6.5)

## 2015-07-21 NOTE — Assessment & Plan Note (Signed)
Blood sugars well controlled by report. Repeat A1c with labs today. Continue Metformin and Glipizide.

## 2015-07-21 NOTE — Progress Notes (Signed)
Pre visit review using our clinic review tool, if applicable. No additional management support is needed unless otherwise documented below in the visit note. 

## 2015-07-21 NOTE — Progress Notes (Signed)
Subjective:    Patient ID: Guy Phillips, male    DOB: Jun 15, 1941, 74 y.o.   MRN: [id number]  HPI  74YO male presents for follow up.  DM - BG well controlled. Only one reading over 200 after eating grapes.  HTN - No CP, dyspnea. Compliant with medication.  Has follow up at [facility] next year.  Continues to go to gym to exercise on bike and do light weight lifting.  No new concerns today.  Wt Readings from Last 3 Encounters:  07/21/15 233 lb 2 oz (105.745 kg)  04/24/15 235 lb 9.6 oz (106.867 kg)  04/18/15 236 lb 2 oz (107.106 kg)   BP Readings from Last 3 Encounters:  07/21/15 128/71  04/24/15 138/72  04/18/15 137/67    Past Medical History  Diagnosis Date  . Hypertension   . Hyperlipidemia   . CAD (coronary artery disease)     s/p MI 2011 Cath showed 100% occlusion of RCA with collaterals open, followed by Dr. Ubaldo Glassing  . Hernia     Umbilical  . Kidney stone     2008  . Diabetes mellitus Dx 2005    HgbA1c 6.8%, Optho [facility] 2001  . 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 Evorn Gong   No family history on file. Past Surgical History  Procedure Laterality Date  . Functional endoscopic sinus surgery      Dr. Fatima Sanger in [place]  . Coronary artery bypass graft  2015    with AVR   Social History   Social History  . Marital Status: Married    Spouse Name: N/A  . Number of Children: N/A  . Years of Education: N/A   Social History Main Topics  . Smoking status: Former Smoker    Quit date: 07/03/1991  . Smokeless tobacco: Never Used  . Alcohol Use: No  . Drug Use: No  . Sexual Activity: No   Other Topics Concern  . None   Social History Narrative    Review of Systems  Constitutional: Negative for fever, chills, activity change, appetite change, fatigue and unexpected weight change.  Eyes: Negative for visual disturbance.  Respiratory: Negative for cough and shortness of breath.   Cardiovascular: Negative for chest pain, palpitations  and leg swelling.  Gastrointestinal: Negative for nausea, vomiting, abdominal pain, diarrhea, constipation and abdominal distention.  Genitourinary: Negative for dysuria, urgency and difficulty urinating.  Musculoskeletal: Negative for arthralgias and gait problem.  Skin: Negative for color change and rash.  Hematological: Negative for adenopathy.  Psychiatric/Behavioral: Negative for sleep disturbance and dysphoric mood. The patient is not nervous/anxious.        Objective:    BP 128/71 mmHg  Pulse 61  Temp(Src) 98 F (36.7 C) (Oral)  Ht 5\' 8"  (1.727 m)  Wt 233 lb 2 oz (105.745 kg)  BMI 35.45 kg/m2  SpO2 96% Physical Exam  Constitutional: He is oriented to person, place, and time. He appears well-developed and well-nourished. No distress.  HENT:  Head: Normocephalic and atraumatic.  Right Ear: External ear normal.  Left Ear: External ear normal.  Nose: Nose normal.  Mouth/Throat: Oropharynx is clear and moist. No oropharyngeal exudate.  Eyes: Conjunctivae and EOM are normal. Pupils are equal, round, and reactive to light. Right eye exhibits no discharge. Left eye exhibits no discharge. No scleral icterus.  Neck: Normal range of motion. Neck supple. No tracheal deviation present. No thyromegaly present.  Cardiovascular: Normal rate and regular rhythm.  Exam reveals no gallop and no friction rub.   Murmur heard. Pulmonary/Chest: Effort normal and breath sounds normal. No accessory muscle usage. No tachypnea. No respiratory distress. He has no decreased breath sounds. He has no wheezes. He has no rhonchi. He has no rales. He exhibits no tenderness.  Musculoskeletal: Normal range of motion. He exhibits no edema.  Lymphadenopathy:    He has no cervical adenopathy.  Neurological: He is alert and oriented to person, place, and time. No cranial nerve deficit. Coordination normal.  Skin: Skin is warm and dry. No rash noted. He is not diaphoretic. No erythema. No pallor.  Psychiatric: He  has a normal mood and affect. His behavior is normal. Judgment and thought content normal.          Assessment & Plan:   Problem List Items Addressed This Visit      Unprioritized   Diabetes mellitus type 2 in obese ([place]) - Primary    Blood sugars well controlled by report. Repeat A1c with labs today. Continue Metformin and Glipizide.      Relevant Orders   Comprehensive metabolic panel   Hemoglobin A1c   Lipid panel   Microalbumin / creatinine urine ratio   Hyperlipidemia    Lipids with labs today. Continue Rosuvastatin.      Hypertension    BP Readings from Last 3 Encounters:  07/21/15 128/71  04/24/15 138/72  04/18/15 137/67   BP well controlled. Renal function with labs. Continue current medication.          Return in about 3 months (around 10/21/2015) for Recheck of Diabetes.

## 2015-07-21 NOTE — Patient Instructions (Signed)
Labs today.  Follow up in 3 months and sooner as needed. 

## 2015-07-21 NOTE — Assessment & Plan Note (Signed)
BP Readings from Last 3 Encounters:  07/21/15 128/71  04/24/15 138/72  04/18/15 137/67   BP well controlled. Renal function with labs. Continue current medication.

## 2015-07-21 NOTE — Assessment & Plan Note (Signed)
Lipids with labs today. Continue Rosuvastatin.

## 2015-07-26 DIAGNOSIS — M62830 Muscle spasm of back: Secondary | ICD-10-CM | POA: Diagnosis not present

## 2015-07-26 DIAGNOSIS — M5416 Radiculopathy, lumbar region: Secondary | ICD-10-CM | POA: Diagnosis not present

## 2015-07-26 DIAGNOSIS — M9903 Segmental and somatic dysfunction of lumbar region: Secondary | ICD-10-CM | POA: Diagnosis not present

## 2015-07-26 DIAGNOSIS — M9902 Segmental and somatic dysfunction of thoracic region: Secondary | ICD-10-CM | POA: Diagnosis not present

## 2015-10-04 DIAGNOSIS — M62830 Muscle spasm of back: Secondary | ICD-10-CM | POA: Diagnosis not present

## 2015-10-04 DIAGNOSIS — M9903 Segmental and somatic dysfunction of lumbar region: Secondary | ICD-10-CM | POA: Diagnosis not present

## 2015-10-04 DIAGNOSIS — M9902 Segmental and somatic dysfunction of thoracic region: Secondary | ICD-10-CM | POA: Diagnosis not present

## 2015-10-04 DIAGNOSIS — M5416 Radiculopathy, lumbar region: Secondary | ICD-10-CM | POA: Diagnosis not present

## 2015-10-05 ENCOUNTER — Other Ambulatory Visit: Payer: Self-pay | Admitting: Internal Medicine

## 2015-10-16 DIAGNOSIS — L820 Inflamed seborrheic keratosis: Secondary | ICD-10-CM | POA: Diagnosis not present

## 2015-10-16 DIAGNOSIS — Z85828 Personal history of other malignant neoplasm of skin: Secondary | ICD-10-CM | POA: Diagnosis not present

## 2015-10-16 DIAGNOSIS — L814 Other melanin hyperpigmentation: Secondary | ICD-10-CM | POA: Diagnosis not present

## 2015-10-16 DIAGNOSIS — L570 Actinic keratosis: Secondary | ICD-10-CM | POA: Diagnosis not present

## 2015-10-23 ENCOUNTER — Ambulatory Visit (INDEPENDENT_AMBULATORY_CARE_PROVIDER_SITE_OTHER): Payer: Medicare Other | Admitting: Internal Medicine

## 2015-10-23 ENCOUNTER — Telehealth: Payer: Self-pay | Admitting: Internal Medicine

## 2015-10-23 ENCOUNTER — Encounter: Payer: Self-pay | Admitting: Internal Medicine

## 2015-10-23 VITALS — BP 130/76 | HR 68 | Temp 97.9°F | Ht 68.0 in | Wt 237.0 lb

## 2015-10-23 DIAGNOSIS — E1142 Type 2 diabetes mellitus with diabetic polyneuropathy: Secondary | ICD-10-CM | POA: Diagnosis not present

## 2015-10-23 DIAGNOSIS — E1169 Type 2 diabetes mellitus with other specified complication: Secondary | ICD-10-CM

## 2015-10-23 DIAGNOSIS — E669 Obesity, unspecified: Secondary | ICD-10-CM | POA: Diagnosis not present

## 2015-10-23 DIAGNOSIS — E785 Hyperlipidemia, unspecified: Secondary | ICD-10-CM

## 2015-10-23 DIAGNOSIS — I10 Essential (primary) hypertension: Secondary | ICD-10-CM

## 2015-10-23 DIAGNOSIS — E119 Type 2 diabetes mellitus without complications: Secondary | ICD-10-CM | POA: Diagnosis not present

## 2015-10-23 DIAGNOSIS — E1140 Type 2 diabetes mellitus with diabetic neuropathy, unspecified: Secondary | ICD-10-CM | POA: Insufficient documentation

## 2015-10-23 LAB — COMPREHENSIVE METABOLIC PANEL
ALK PHOS: 64 U/L (ref 39–117)
ALT: 15 U/L (ref 0–53)
AST: 16 U/L (ref 0–37)
Albumin: 4.4 g/dL (ref 3.5–5.2)
BUN: 17 mg/dL (ref 6–23)
CHLORIDE: 100 meq/L (ref 96–112)
CO2: 30 mEq/L (ref 19–32)
Calcium: 9.6 mg/dL (ref 8.4–10.5)
Creatinine, Ser: 1.18 mg/dL (ref 0.40–1.50)
GFR: 64.27 mL/min (ref >60.00)
GLUCOSE: 170 mg/dL — AB (ref 70–99)
POTASSIUM: 4.3 meq/L (ref 3.5–5.1)
SODIUM: 139 meq/L (ref 135–145)
Total Bilirubin: 0.9 mg/dL (ref 0.2–1.2)
Total Protein: 7.2 g/dL (ref 6.0–8.3)

## 2015-10-23 LAB — LIPID PANEL
CHOL/HDL RATIO: 4
Cholesterol: 147 mg/dL (ref 0–200)
HDL: 36.1 mg/dL — AB (ref >39.00)
LDL Cholesterol: 71 mg/dL (ref 0–99)
NONHDL: 110.86
TRIGLYCERIDES: 197 mg/dL — AB (ref 0.0–149.0)
VLDL: 39.4 mg/dL (ref 0.0–40.0)

## 2015-10-23 LAB — HEMOGLOBIN A1C: HEMOGLOBIN A1C: 7 % — AB (ref 4.6–6.5)

## 2015-10-23 NOTE — Progress Notes (Signed)
Pre visit review using our clinic review tool, if applicable. No additional management support is needed unless otherwise documented below in the visit note. 

## 2015-10-23 NOTE — Assessment & Plan Note (Signed)
Symptoms c/w diabetic neuropathy. Monofilament exam normal today. Will continue to monitor. Encouraged healthy diet and exercise, compliance with medication. Discussed adding neurontin if symptoms bothersome.

## 2015-10-23 NOTE — Assessment & Plan Note (Signed)
BG well controlled by report. A1c with labs. Continue metformin and glipizide.

## 2015-10-23 NOTE — Progress Notes (Signed)
Subjective:    Patient ID: Guy Phillips, male    DOB: [birth year], 75 y.o.   MRN: [id number]  HPI  75YO male presents for follow up.  DM - BG mostly low 100s. One low of 52 and few highs near 200 however rare. Compliant with medication.  Has evaluation with eye doctor at [facility]. Notes some blurred vision at distance in right eye. Attributes to cataract.  Has followup with cardiology in June. No recent chest pain, palpitations.  Exercising on treadmill.  Recently noted some tingling on bottom of feet. Comes and goes. Does not keep him awake. Not taking anything for this.  Wt Readings from Last 3 Encounters:  10/23/15 237 lb (107.502 kg)  07/21/15 233 lb 2 oz (105.745 kg)  04/24/15 235 lb 9.6 oz (106.867 kg)   BP Readings from Last 3 Encounters:  10/23/15 130/76  07/21/15 128/71  04/24/15 138/72    Past Medical History  Diagnosis Date  . Hypertension   . Hyperlipidemia   . CAD (coronary artery disease)     s/p MI 2011 Cath showed 100% occlusion of RCA with collaterals open, followed by Dr. Ubaldo Glassing  . Hernia     Umbilical  . Kidney stone     2008  . Diabetes mellitus Dx 2005    HgbA1c 6.8%, Optho [facility] 2001  . 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 Evorn Gong   No family history on file. Past Surgical History  Procedure Laterality Date  . Functional endoscopic sinus surgery      Dr. Fatima Sanger in [place]  . Coronary artery bypass graft  2015    with AVR   Social History   Social History  . Marital Status: Married    Spouse Name: N/A  . Number of Children: N/A  . Years of Education: N/A   Social History Main Topics  . Smoking status: Former Smoker    Quit date: 07/03/1991  . Smokeless tobacco: Never Used  . Alcohol Use: No  . Drug Use: No  . Sexual Activity: No   Other Topics Concern  . None   Social History Narrative    Review of Systems  Constitutional: Negative for fever, chills, activity change, appetite change,  fatigue and unexpected weight change.  Eyes: Negative for visual disturbance.  Respiratory: Negative for cough and shortness of breath.   Cardiovascular: Negative for chest pain, palpitations and leg swelling.  Gastrointestinal: Negative for nausea, vomiting, abdominal pain, diarrhea, constipation and abdominal distention.  Genitourinary: Negative for dysuria, urgency and difficulty urinating.  Musculoskeletal: Negative for arthralgias and gait problem.  Skin: Negative for color change and rash.  Neurological: Positive for numbness. Negative for weakness.  Hematological: Negative for adenopathy.  Psychiatric/Behavioral: Negative for sleep disturbance and dysphoric mood. The patient is not nervous/anxious.        Objective:    BP 130/76 mmHg  Pulse 68  Temp(Src) 97.9 F (36.6 C) (Oral)  Ht 5\' 8"  (1.727 m)  Wt 237 lb (107.502 kg)  BMI 36.04 kg/m2  SpO2 98% Physical Exam  Constitutional: He is oriented to person, place, and time. He appears well-developed and well-nourished. No distress.  HENT:  Head: Normocephalic and atraumatic.  Right Ear: External ear normal.  Left Ear: External ear normal.  Nose: Nose normal.  Mouth/Throat: Oropharynx is clear and moist. No oropharyngeal exudate.  Eyes: Conjunctivae and EOM are normal. Pupils are equal, round, and reactive to light. Right eye exhibits  no discharge. Left eye exhibits no discharge. No scleral icterus.  Neck: Normal range of motion. Neck supple. No tracheal deviation present. No thyromegaly present.  Cardiovascular: Normal rate, regular rhythm and normal heart sounds.  Exam reveals no gallop and no friction rub.   No murmur heard. Pulmonary/Chest: Effort normal and breath sounds normal. No accessory muscle usage. No tachypnea. No respiratory distress. He has no decreased breath sounds. He has no wheezes. He has no rhonchi. He has no rales. He exhibits no tenderness.  Musculoskeletal: Normal range of motion. He exhibits no edema.    Lymphadenopathy:    He has no cervical adenopathy.  Neurological: He is alert and oriented to person, place, and time. No cranial nerve deficit. Coordination normal.  Skin: Skin is warm and dry. No rash noted. He is not diaphoretic. No erythema. No pallor.  Psychiatric: He has a normal mood and affect. His behavior is normal. Judgment and thought content normal.          Assessment & Plan:   Problem List Items Addressed This Visit      Unprioritized   Diabetes mellitus type 2 in obese ([place]) - Primary    BG well controlled by report. A1c with labs. Continue metformin and glipizide.      Relevant Orders   Comprehensive metabolic panel   Hemoglobin A1c   Lipid panel   Hyperlipidemia    Will check lipids and LFTs with labs. Continue Crestor.      Hypertension    BP Readings from Last 3 Encounters:  10/23/15 130/76  07/21/15 128/71  04/24/15 138/72   BP well controlled. Renal function with labs. Continue current medication.      Neuropathy, diabetic ([place])    Symptoms c/w diabetic neuropathy. Monofilament exam normal today. Will continue to monitor. Encouraged healthy diet and exercise, compliance with medication. Discussed adding neurontin if symptoms bothersome.          Return in about 3 months (around 01/21/2016) for Recheck of Diabetes.

## 2015-10-23 NOTE — Assessment & Plan Note (Signed)
BP Readings from Last 3 Encounters:  10/23/15 130/76  07/21/15 128/71  04/24/15 138/72   BP well controlled. Renal function with labs. Continue current medication.

## 2015-10-23 NOTE — Telephone Encounter (Signed)
Pt was in to see Dr. Derrel Nip today. He forgot to ask her about a new prescription about a new testing supply for diabetes. Please call pt at home number.

## 2015-10-23 NOTE — Patient Instructions (Signed)
Labs today.   Follow up in 3 months.  

## 2015-10-23 NOTE — Assessment & Plan Note (Signed)
Will check lipids and LFTs with labs. Continue Crestor.

## 2015-10-24 DIAGNOSIS — H25813 Combined forms of age-related cataract, bilateral: Secondary | ICD-10-CM | POA: Diagnosis not present

## 2015-10-24 DIAGNOSIS — H43811 Vitreous degeneration, right eye: Secondary | ICD-10-CM | POA: Diagnosis not present

## 2015-10-24 DIAGNOSIS — H40013 Open angle with borderline findings, low risk, bilateral: Secondary | ICD-10-CM | POA: Diagnosis not present

## 2015-10-30 ENCOUNTER — Other Ambulatory Visit: Payer: Self-pay | Admitting: *Deleted

## 2015-10-30 MED ORDER — ONETOUCH ULTRA SYSTEM W/DEVICE KIT: 1.0000 | PACK | Freq: Once | Status: AC

## 2015-11-01 DIAGNOSIS — M62830 Muscle spasm of back: Secondary | ICD-10-CM | POA: Diagnosis not present

## 2015-11-01 DIAGNOSIS — M9902 Segmental and somatic dysfunction of thoracic region: Secondary | ICD-10-CM | POA: Diagnosis not present

## 2015-11-01 DIAGNOSIS — M9903 Segmental and somatic dysfunction of lumbar region: Secondary | ICD-10-CM | POA: Diagnosis not present

## 2015-11-01 DIAGNOSIS — M5416 Radiculopathy, lumbar region: Secondary | ICD-10-CM | POA: Diagnosis not present

## 2015-11-03 ENCOUNTER — Other Ambulatory Visit: Payer: Self-pay | Admitting: Internal Medicine

## 2015-11-05 ENCOUNTER — Other Ambulatory Visit: Payer: Self-pay | Admitting: Internal Medicine

## 2015-11-20 ENCOUNTER — Encounter: Payer: Self-pay | Admitting: Internal Medicine

## 2015-11-20 ENCOUNTER — Other Ambulatory Visit: Payer: Self-pay | Admitting: Internal Medicine

## 2015-11-20 MED ORDER — SITAGLIPTIN PHOSPHATE 25 MG PO TABS: 25.0000 mg | ORAL_TABLET | Freq: Every day | ORAL | Status: DC

## 2015-11-29 DIAGNOSIS — M9903 Segmental and somatic dysfunction of lumbar region: Secondary | ICD-10-CM | POA: Diagnosis not present

## 2015-11-29 DIAGNOSIS — M5416 Radiculopathy, lumbar region: Secondary | ICD-10-CM | POA: Diagnosis not present

## 2015-11-29 DIAGNOSIS — M9902 Segmental and somatic dysfunction of thoracic region: Secondary | ICD-10-CM | POA: Diagnosis not present

## 2015-11-29 DIAGNOSIS — M62830 Muscle spasm of back: Secondary | ICD-10-CM | POA: Diagnosis not present

## 2015-12-27 DIAGNOSIS — M9903 Segmental and somatic dysfunction of lumbar region: Secondary | ICD-10-CM | POA: Diagnosis not present

## 2015-12-27 DIAGNOSIS — M5416 Radiculopathy, lumbar region: Secondary | ICD-10-CM | POA: Diagnosis not present

## 2015-12-27 DIAGNOSIS — M9902 Segmental and somatic dysfunction of thoracic region: Secondary | ICD-10-CM | POA: Diagnosis not present

## 2015-12-27 DIAGNOSIS — M62830 Muscle spasm of back: Secondary | ICD-10-CM | POA: Diagnosis not present

## 2016-01-23 DIAGNOSIS — M62830 Muscle spasm of back: Secondary | ICD-10-CM | POA: Diagnosis not present

## 2016-01-23 DIAGNOSIS — M9903 Segmental and somatic dysfunction of lumbar region: Secondary | ICD-10-CM | POA: Diagnosis not present

## 2016-01-23 DIAGNOSIS — M5416 Radiculopathy, lumbar region: Secondary | ICD-10-CM | POA: Diagnosis not present

## 2016-01-23 DIAGNOSIS — M9902 Segmental and somatic dysfunction of thoracic region: Secondary | ICD-10-CM | POA: Diagnosis not present

## 2016-01-24 ENCOUNTER — Ambulatory Visit (INDEPENDENT_AMBULATORY_CARE_PROVIDER_SITE_OTHER): Payer: Medicare Other | Admitting: Internal Medicine

## 2016-01-24 ENCOUNTER — Encounter: Payer: Self-pay | Admitting: Internal Medicine

## 2016-01-24 VITALS — BP 140/80 | HR 67 | Ht 68.0 in | Wt 235.0 lb

## 2016-01-24 DIAGNOSIS — E1169 Type 2 diabetes mellitus with other specified complication: Secondary | ICD-10-CM

## 2016-01-24 DIAGNOSIS — E669 Obesity, unspecified: Secondary | ICD-10-CM | POA: Diagnosis not present

## 2016-01-24 DIAGNOSIS — I10 Essential (primary) hypertension: Secondary | ICD-10-CM

## 2016-01-24 DIAGNOSIS — I2510 Atherosclerotic heart disease of native coronary artery without angina pectoris: Secondary | ICD-10-CM | POA: Diagnosis not present

## 2016-01-24 DIAGNOSIS — E785 Hyperlipidemia, unspecified: Secondary | ICD-10-CM | POA: Diagnosis not present

## 2016-01-24 DIAGNOSIS — Z951 Presence of aortocoronary bypass graft: Secondary | ICD-10-CM | POA: Diagnosis not present

## 2016-01-24 DIAGNOSIS — E119 Type 2 diabetes mellitus without complications: Secondary | ICD-10-CM | POA: Diagnosis not present

## 2016-01-24 DIAGNOSIS — I272 Other secondary pulmonary hypertension: Secondary | ICD-10-CM | POA: Diagnosis not present

## 2016-01-24 DIAGNOSIS — Z952 Presence of prosthetic heart valve: Secondary | ICD-10-CM | POA: Diagnosis not present

## 2016-01-24 DIAGNOSIS — I63412 Cerebral infarction due to embolism of left middle cerebral artery: Secondary | ICD-10-CM | POA: Diagnosis not present

## 2016-01-24 DIAGNOSIS — E782 Mixed hyperlipidemia: Secondary | ICD-10-CM | POA: Diagnosis not present

## 2016-01-24 DIAGNOSIS — I059 Rheumatic mitral valve disease, unspecified: Secondary | ICD-10-CM | POA: Diagnosis not present

## 2016-01-24 LAB — COMPREHENSIVE METABOLIC PANEL
ALT: 15 U/L (ref 0–53)
AST: 15 U/L (ref 0–37)
Albumin: 4.5 g/dL (ref 3.5–5.2)
Alkaline Phosphatase: 66 U/L (ref 39–117)
BUN: 15 mg/dL (ref 6–23)
CHLORIDE: 101 meq/L (ref 96–112)
CO2: 30 meq/L (ref 19–32)
Calcium: 9.6 mg/dL (ref 8.4–10.5)
Creatinine, Ser: 1.1 mg/dL (ref 0.40–1.50)
GFR: 69.64 mL/min (ref >60.00)
GLUCOSE: 142 mg/dL — AB (ref 70–99)
POTASSIUM: 4.4 meq/L (ref 3.5–5.1)
Sodium: 141 mEq/L (ref 135–145)
Total Bilirubin: 1 mg/dL (ref 0.2–1.2)
Total Protein: 6.8 g/dL (ref 6.0–8.3)

## 2016-01-24 LAB — LIPID PANEL
CHOLESTEROL: 147 mg/dL (ref 0–200)
HDL: 35.8 mg/dL — ABNORMAL LOW (ref >39.00)
LDL CALC: 78 mg/dL (ref 0–99)
NONHDL: 111.6
Total CHOL/HDL Ratio: 4
Triglycerides: 170 mg/dL — ABNORMAL HIGH (ref 0.0–149.0)
VLDL: 34 mg/dL (ref 0.0–40.0)

## 2016-01-24 LAB — MICROALBUMIN / CREATININE URINE RATIO
CREATININE, U: 246 mg/dL
MICROALB UR: 1.8 mg/dL (ref 0.0–1.9)
Microalb Creat Ratio: 0.7 mg/g (ref 0.0–30.0)

## 2016-01-24 LAB — HEMOGLOBIN A1C: Hgb A1c MFr Bld: 6.9 % — ABNORMAL HIGH (ref 4.6–6.5)

## 2016-01-24 NOTE — Progress Notes (Signed)
Subjective:    Patient ID: Guy Phillips, male    DOB: 1941/04/12, 75 y.o.   MRN: [id number]  HPI  75YO male presents for follow up.  DM - BG more elevated as high as 298. Before starting Januvia. Started on Januvia and BG improved. Mostly near 120.  Feeling well. No recent chest pain, dyspnea. Has follow up with cardiology today.   Lab Results  Component Value Date   HGBA1C 6.9* 01/24/2016     Wt Readings from Last 3 Encounters:  01/24/16 235 lb (106.595 kg)  10/23/15 237 lb (107.502 kg)  07/21/15 233 lb 2 oz (105.745 kg)   BP Readings from Last 3 Encounters:  01/24/16 140/80  10/23/15 130/76  07/21/15 128/71    Past Medical History  Diagnosis Date  . Hypertension   . Hyperlipidemia   . CAD (coronary artery disease)     s/p MI 2011 Cath showed 100% occlusion of RCA with collaterals open, followed by Dr. Ubaldo Glassing  . Hernia     Umbilical  . Kidney stone     2008  . Diabetes mellitus Dx 2005    HgbA1c 6.8%, Optho [facility] 2001  . 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 Evorn Gong   No family history on file. Past Surgical History  Procedure Laterality Date  . Functional endoscopic sinus surgery      Dr. Fatima Sanger in [place]  . Coronary artery bypass graft  2015    with AVR   Social History   Social History  . Marital Status: Married    Spouse Name: N/A  . Number of Children: N/A  . Years of Education: N/A   Social History Main Topics  . Smoking status: Former Smoker    Quit date: 07/03/1991  . Smokeless tobacco: Never Used  . Alcohol Use: No  . Drug Use: No  . Sexual Activity: No   Other Topics Concern  . None   Social History Narrative    Review of Systems  Constitutional: Negative for fever, chills, activity change, appetite change, fatigue and unexpected weight change.  Eyes: Negative for visual disturbance.  Respiratory: Negative for cough and shortness of breath.   Cardiovascular: Negative for chest pain, palpitations  and leg swelling.  Gastrointestinal: Negative for nausea, vomiting, abdominal pain, diarrhea, constipation and abdominal distention.  Genitourinary: Negative for dysuria, urgency and difficulty urinating.  Musculoskeletal: Negative for arthralgias and gait problem.  Skin: Negative for color change and rash.  Hematological: Negative for adenopathy.  Psychiatric/Behavioral: Negative for sleep disturbance and dysphoric mood. The patient is not nervous/anxious.        Objective:    BP 140/80 mmHg  Pulse 67  Ht 5\' 8"  (1.727 m)  Wt 235 lb (106.595 kg)  BMI 35.74 kg/m2  SpO2 97% Physical Exam  Constitutional: He is oriented to person, place, and time. He appears well-developed and well-nourished. No distress.  HENT:  Head: Normocephalic and atraumatic.  Right Ear: External ear normal.  Left Ear: External ear normal.  Nose: Nose normal.  Mouth/Throat: Oropharynx is clear and moist. No oropharyngeal exudate.  Eyes: Conjunctivae and EOM are normal. Pupils are equal, round, and reactive to light. Right eye exhibits no discharge. Left eye exhibits no discharge. No scleral icterus.  Neck: Normal range of motion. Neck supple. No tracheal deviation present. No thyromegaly present.  Cardiovascular: Normal rate, regular rhythm and normal heart sounds.  Exam reveals no gallop and no friction rub.  No murmur heard. Pulmonary/Chest: Effort normal and breath sounds normal. No accessory muscle usage. No tachypnea. No respiratory distress. He has no decreased breath sounds. He has no wheezes. He has no rhonchi. He has no rales. He exhibits no tenderness.  Musculoskeletal: Normal range of motion. He exhibits no edema.  Lymphadenopathy:    He has no cervical adenopathy.  Neurological: He is alert and oriented to person, place, and time. No cranial nerve deficit. Coordination normal.  Skin: Skin is warm and dry. No rash noted. He is not diaphoretic. No erythema. No pallor.  Psychiatric: He has a normal  mood and affect. His behavior is normal. Judgment and thought content normal.          Assessment & Plan:   Problem List Items Addressed This Visit      Unprioritized   Diabetes mellitus type 2 in obese [facility]) - Primary    Lab Results  Component Value Date   HGBA1C 6.9* 01/24/2016   BG well controlled. Continue current medications. Discussed potentially changing Januvia to Onglyza if financially this was a better option. Will hold off for now.      Relevant Orders   Comprehensive metabolic panel (Completed)   Hemoglobin A1c (Completed)   Lipid panel (Completed)   Microalbumin / creatinine urine ratio (Completed)   Hyperlipidemia    Lipids well controlled. Continue Crestor.      Relevant Medications   metoprolol tartrate (LOPRESSOR) 25 MG tablet   triamterene-hydrochlorothiazide (DYAZIDE) 50-25 MG capsule   Hypertension    BP Readings from Last 3 Encounters:  01/24/16 140/80  10/23/15 130/76  07/21/15 128/71   BP generally well controlled. Renal function today stable. Follow up with cardiology today. Continue current medications.      Relevant Medications   metoprolol tartrate (LOPRESSOR) 25 MG tablet   triamterene-hydrochlorothiazide (DYAZIDE) 50-25 MG capsule       Return in about 3 months (around 04/25/2016) for Recheck of Diabetes.  Ronette Deter, MD Internal Medicine [facility]

## 2016-01-24 NOTE — Patient Instructions (Signed)
Labs today.   Follow up in 3 months.  

## 2016-01-24 NOTE — Assessment & Plan Note (Signed)
Lab Results  Component Value Date   HGBA1C 6.9* 01/24/2016   BG well controlled. Continue current medications. Discussed potentially changing Januvia to Onglyza if financially this was a better option. Will hold off for now.

## 2016-01-24 NOTE — Assessment & Plan Note (Signed)
Lipids well controlled Continue Crestor 

## 2016-01-24 NOTE — Assessment & Plan Note (Signed)
BP Readings from Last 3 Encounters:  01/24/16 140/80  10/23/15 130/76  07/21/15 128/71   BP generally well controlled. Renal function today stable. Follow up with cardiology today. Continue current medications.

## 2016-02-28 DIAGNOSIS — M5416 Radiculopathy, lumbar region: Secondary | ICD-10-CM | POA: Diagnosis not present

## 2016-02-28 DIAGNOSIS — M9902 Segmental and somatic dysfunction of thoracic region: Secondary | ICD-10-CM | POA: Diagnosis not present

## 2016-02-28 DIAGNOSIS — M9903 Segmental and somatic dysfunction of lumbar region: Secondary | ICD-10-CM | POA: Diagnosis not present

## 2016-02-28 DIAGNOSIS — M62830 Muscle spasm of back: Secondary | ICD-10-CM | POA: Diagnosis not present

## 2016-03-27 DIAGNOSIS — M5416 Radiculopathy, lumbar region: Secondary | ICD-10-CM | POA: Diagnosis not present

## 2016-03-27 DIAGNOSIS — M9903 Segmental and somatic dysfunction of lumbar region: Secondary | ICD-10-CM | POA: Diagnosis not present

## 2016-03-27 DIAGNOSIS — M9902 Segmental and somatic dysfunction of thoracic region: Secondary | ICD-10-CM | POA: Diagnosis not present

## 2016-03-27 DIAGNOSIS — M62830 Muscle spasm of back: Secondary | ICD-10-CM | POA: Diagnosis not present

## 2016-04-10 ENCOUNTER — Other Ambulatory Visit: Payer: Self-pay | Admitting: Internal Medicine

## 2016-04-16 DIAGNOSIS — L570 Actinic keratosis: Secondary | ICD-10-CM | POA: Diagnosis not present

## 2016-04-16 DIAGNOSIS — L814 Other melanin hyperpigmentation: Secondary | ICD-10-CM | POA: Diagnosis not present

## 2016-04-16 DIAGNOSIS — B079 Viral wart, unspecified: Secondary | ICD-10-CM | POA: Diagnosis not present

## 2016-04-16 DIAGNOSIS — Z85828 Personal history of other malignant neoplasm of skin: Secondary | ICD-10-CM | POA: Diagnosis not present

## 2016-04-24 ENCOUNTER — Ambulatory Visit (INDEPENDENT_AMBULATORY_CARE_PROVIDER_SITE_OTHER): Payer: Medicare Other

## 2016-04-24 VITALS — BP 128/62 | HR 67 | Temp 98.7°F | Resp 14 | Ht 68.0 in | Wt 233.4 lb

## 2016-04-24 DIAGNOSIS — M5416 Radiculopathy, lumbar region: Secondary | ICD-10-CM | POA: Diagnosis not present

## 2016-04-24 DIAGNOSIS — M62830 Muscle spasm of back: Secondary | ICD-10-CM | POA: Diagnosis not present

## 2016-04-24 DIAGNOSIS — M9903 Segmental and somatic dysfunction of lumbar region: Secondary | ICD-10-CM | POA: Diagnosis not present

## 2016-04-24 DIAGNOSIS — Z0000 Encounter for general adult medical examination without abnormal findings: Secondary | ICD-10-CM

## 2016-04-24 DIAGNOSIS — M9902 Segmental and somatic dysfunction of thoracic region: Secondary | ICD-10-CM | POA: Diagnosis not present

## 2016-04-24 NOTE — Progress Notes (Signed)
Subjective:   Guy Phillips is a 75 y.o. male who presents for Medicare Annual/Subsequent preventive examination.  Review of Systems:  No ROS.  Medicare Wellness Visit.  Cardiac Risk Factors include: hypertension;obesity (BMI >30kg/m2);diabetes mellitus;advanced age (>20mn, >>57women);male gender       Objective:    Vitals: BP 128/62 (BP Location: Right Arm, Patient Position: Sitting, Cuff Size: Normal)   Pulse 67   Temp 98.7 F (37.1 C) (Oral)   Resp 14   Ht _0  (1.727 m)   Wt 233 lb 6.4 oz (105.9 kg)   SpO2 96%   BMI 35.49 kg/m   Body mass index is 35.49 kg/m.  Tobacco History  Smoking Status  . Former Smoker  . Quit date: 07/03/1991  Smokeless Tobacco  . Never Used     Counseling given: Not Answered   Past Medical History:  Diagnosis Date  . CAD (coronary artery disease)    s/p MI 2011 Cath showed 100% occlusion of RCA with collaterals open, followed by Dr. FUbaldo Glassing . Diabetes mellitus Dx 2005   HgbA1c 6.8%, Optho [facility] 2001  . Hernia    Umbilical  . Hyperlipidemia   . Hypertension   . Kidney stone    2008  . 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 DEvorn Gong  Past Surgical History:  Procedure Laterality Date  . CORONARY ARTERY BYPASS GRAFT  2015   with AVR  . FUN[place]AL [place]OS[place]IC [place]  History reviewed. No pertinent family history. History  Sexual Activity  . Sexual activity: No    Outpatient Encounter Prescriptions as of 04/24/2016  Medication Sig  . acetaminophen (TYLENOL) 325 MG tablet Take by mouth.  .Marland Kitchenaspirin 81 MG tablet Take 81 mg by mouth daily.    . Blood Glucose Monitoring Suppl (ONE TOUCH ULTRA SYSTEM KIT) w/Device KIT 1 kit by Does not apply route once.  . Cholecalciferol (VITAMIN D3) 2000 UNITS TABS Take 1 tablet by mouth daily.    . CRESTOR 20 MG tablet TAKE 1 TABLET DAILY  . fexofenadine (ALLEGRA) 180 MG tablet Take 180 mg by mouth daily.  . fluticasone (FLONASE) 50  MCG/ACT nasal spray Place into the nose Daily.  .Marland KitchenglipiZIDE (GLUCOTROL) 5 MG tablet TAKE 1 TABLET BY MOUTH TWICE A DAY BEFORE MEALS  . metFORMIN (GLUCOPHAGE) 1000 MG tablet TAKE 1 TABLET TWICE A DAY  WITH MEALS  . metoprolol tartrate (LOPRESSOR) 25 MG tablet Take 25 mg by mouth as directed. Take half tablet in the am and half in pm  . Multiple Vitamin (MULTIVITAMIN) tablet Take 1 tablet by mouth daily.    . nitroGLYCERIN (NITROSTAT) 0.4 MG SL tablet Place 0.4 mg under the tongue every 5 (five) minutes as needed.  . triamterene-hydrochlorothiazide (DYAZIDE) 50-25 MG capsule Take 1 capsule by mouth daily.  . fesoterodine (TOVIAZ) 8 MG TB24 Take 1 tablet (8 mg total) by mouth daily. (Patient not taking: Reported on 04/24/2016)  . sitaGLIPtin (JANUVIA) 25 MG tablet Take 1 tablet (25 mg total) by mouth daily. (Patient not taking: Reported on 04/24/2016)   No facility-administered encounter medications on file as of 04/24/2016.     Activities of Daily Living In your present state of health, do you have any difficulty performing the following activities: 04/24/2016  Hearing? Y  Vision? N  Difficulty concentrating or making decisions? N  Walking or climbing stairs? N  Dressing or  bathing? N  Doing errands, shopping? N  Preparing Food and eating ? N  Using the Toilet? N  In the past six months, have you accidently leaked urine? N  Do you have problems with loss of bowel control? N  Managing your Medications? N  Managing your Finances? N  Housekeeping or managing your Housekeeping? N  Some recent data might be hidden    Patient Care Team: Jackolyn Confer, MD as PCP - General (Internal Medicine)   Assessment:    This is a routine wellness examination for Guy Phillips. The goal of the wellness visit is to assist the patient how to close the gaps in care and create a preventative care plan for the patient.   Taking calcium VIT D3 as appropriate/Osteoporosis risk reviewed.  Medications reviewed;  taking without issues or barriers.  Safety issues reviewed; smoke detectors in the home. Firearms locked in a secure area within the home. Wears seatbelts when driving or riding with others. No violence in the home.  No identified risk were noted; The patient was oriented x 3; appropriate in dress and manner and no objective failures at ADL's or IADL's.   Body mass index; discussed the importance of a healthy diet, water intake and exercise. Educational material provided.  Crbl emblsm w infrct-stable and followed by Dr. Michaelle Birks  Health maintenance gaps; closed.  Patient Concerns: None at this time. Follow up with PCP as needed.  Exercise Activities and Dietary recommendations Current Exercise Habits: Structured exercise class, Type of exercise: calisthenics, Time (Minutes): 60, Frequency (Times/Week): 3, Weekly Exercise (Minutes/Week): 180, Intensity: Moderate  Goals    . Eat more fruits and vegetables          Maintain weight by eating more fruits and vegetables.  Continue work out regiment.     . Healthy Lifestyle          STAY HYDRATED AND DRINK PLENTY OF FLUIDS/WATER. LOW CARB FOODS. STAY ACTIVE. STRETCH. MAINTAIN EXERCISE ROUTINE.      Fall Risk Fall Risk  04/24/2016 04/24/2015 10/19/2014  Falls in the past year? No No No   Depression Screen PHQ 2/9 Scores 04/24/2016 04/24/2015 10/19/2014 07/29/2012  PHQ - 2 Score 0 0 0 0    Cognitive Testing MMSE - Mini Mental State Exam 04/24/2016 04/24/2015  Orientation to time 5 5  Orientation to Place 5 5  Registration 3 3  Attention/ Calculation 5 5  Recall 3 3  Language- name 2 objects 2 2  Language- repeat 1 1  Language- follow 3 step command 3 3  Language- read & follow direction 1 1  Write a sentence 1 1  Copy design 1 1  Total score 30 30    Immunization History  Administered Date(s) Administered  . Influenza Split 07/03/2011, 06/16/2012  . Influenza,inj,Quad PF,36+ Mos 06/16/2013, 07/01/2014, 06/28/2015  .  Pneumococcal Conjugate-13 12/28/2013  . Pneumococcal Polysaccharide-23 10/08/2011  . Tdap 10/08/2011  . Zoster 07/29/2008   Screening Tests Health Maintenance  Topic Date Due  . INFLUENZA VACCINE  04/23/2016  . HEMOGLOBIN A1C  07/26/2016  . OPHTHALMOLOGY EXAM  09/24/2016  . FOOT EXAM  10/22/2016  . URINE MICROALBUMIN  01/23/2017  . COLONOSCOPY  07/29/2018  . TETANUS/TDAP  10/07/2021  . ZOSTAVAX  Completed  . PNA vac Low Risk Adult  Completed      Plan:   End of life planning; Advance aging; Advanced directives discussed. Copy of current HCPOA/Living Will requested.  During the course of the visit the patient  was educated and counseled about the following appropriate screening and preventive services:   Vaccines to include Pneumoccal, Influenza, Hepatitis B, Td, Zostavax, HCV  Electrocardiogram  Cardiovascular Disease  Colorectal cancer screening  Diabetes screening  Prostate Cancer Screening  Glaucoma screening  Nutrition counseling   Smoking cessation counseling  Patient Instructions (the written plan) was given to the patient.    Varney Biles, LPN  02/27/2549

## 2016-04-24 NOTE — Patient Instructions (Addendum)
  Mr. Applin , Thank you for taking time to come for your Medicare Wellness Visit. I appreciate your ongoing commitment to your health goals. Please review the following plan we discussed and let me know if I can assist you in the future.   These are the goals we discussed: Goals    . Eat more fruits and vegetables          Maintain weight by eating more fruits and vegetables.  Continue work out regiment.     . Healthy Lifestyle          STAY HYDRATED AND DRINK PLENTY OF FLUIDS/WATER. LOW CARB FOODS. STAY ACTIVE. STRETCH. MAINTAIN EXERCISE ROUTINE.       This is a list of the screening recommended for you and due dates:  Health Maintenance  Topic Date Due  . Flu Shot  04/23/2016  . Hemoglobin A1C  07/26/2016  . Eye exam for diabetics  09/24/2016  . Complete foot exam   10/22/2016  . Urine Protein Check  01/23/2017  . Colon Cancer Screening  07/29/2018  . Tetanus Vaccine  10/07/2021  . Shingles Vaccine  Completed  . Pneumonia vaccines  Completed

## 2016-04-24 NOTE — Progress Notes (Signed)
Annual Wellness Visit as completed by [facility] Coach was reviewed in full.  

## 2016-04-26 ENCOUNTER — Encounter: Payer: Self-pay | Admitting: Internal Medicine

## 2016-04-26 ENCOUNTER — Ambulatory Visit (INDEPENDENT_AMBULATORY_CARE_PROVIDER_SITE_OTHER): Payer: Medicare Other | Admitting: Internal Medicine

## 2016-04-26 VITALS — BP 106/68 | HR 79 | Ht 68.0 in | Wt 233.6 lb

## 2016-04-26 DIAGNOSIS — E785 Hyperlipidemia, unspecified: Secondary | ICD-10-CM

## 2016-04-26 DIAGNOSIS — I2510 Atherosclerotic heart disease of native coronary artery without angina pectoris: Secondary | ICD-10-CM

## 2016-04-26 DIAGNOSIS — E119 Type 2 diabetes mellitus without complications: Secondary | ICD-10-CM

## 2016-04-26 DIAGNOSIS — I10 Essential (primary) hypertension: Secondary | ICD-10-CM

## 2016-04-26 DIAGNOSIS — E669 Obesity, unspecified: Secondary | ICD-10-CM | POA: Diagnosis not present

## 2016-04-26 DIAGNOSIS — E1169 Type 2 diabetes mellitus with other specified complication: Secondary | ICD-10-CM

## 2016-04-26 LAB — MICROALBUMIN / CREATININE URINE RATIO
CREATININE, U: 188.8 mg/dL
Microalb Creat Ratio: 0.5 mg/g (ref 0.0–30.0)
Microalb, Ur: 1 mg/dL (ref 0.0–1.9)

## 2016-04-26 LAB — COMPREHENSIVE METABOLIC PANEL WITH GFR
ALT: 16 U/L (ref 0–53)
AST: 16 U/L (ref 0–37)
Albumin: 4.4 g/dL (ref 3.5–5.2)
Alkaline Phosphatase: 63 U/L (ref 39–117)
BUN: 18 mg/dL (ref 6–23)
CO2: 29 meq/L (ref 19–32)
Calcium: 9.7 mg/dL (ref 8.4–10.5)
Chloride: 102 meq/L (ref 96–112)
Creatinine, Ser: 1.16 mg/dL (ref 0.40–1.50)
GFR: 65.45 mL/min
Glucose, Bld: 152 mg/dL — ABNORMAL HIGH (ref 70–99)
Potassium: 4 meq/L (ref 3.5–5.1)
Sodium: 140 meq/L (ref 135–145)
Total Bilirubin: 1.2 mg/dL (ref 0.2–1.2)
Total Protein: 7.1 g/dL (ref 6.0–8.3)

## 2016-04-26 LAB — LIPID PANEL
CHOL/HDL RATIO: 4
CHOLESTEROL: 139 mg/dL (ref 0–200)
HDL: 36.2 mg/dL — AB (ref >39.00)
NonHDL: 103.21
Triglycerides: 208 mg/dL — ABNORMAL HIGH (ref 0.0–149.0)
VLDL: 41.6 mg/dL — AB (ref 0.0–40.0)

## 2016-04-26 LAB — HEMOGLOBIN A1C: Hgb A1c MFr Bld: 6.7 % — ABNORMAL HIGH (ref 4.6–6.5)

## 2016-04-26 LAB — LDL CHOLESTEROL, DIRECT: Direct LDL: 79 mg/dL

## 2016-04-26 MED ORDER — ROSUVASTATIN CALCIUM 20 MG PO TABS: 20.0000 mg | ORAL_TABLET | Freq: Every day | ORAL | 3 refills | Status: DC

## 2016-04-26 NOTE — Assessment & Plan Note (Signed)
BP Readings from Last 3 Encounters:  04/26/16 106/68  04/24/16 128/62  01/24/16 140/80   BP well controlled. Continue current medication.

## 2016-04-26 NOTE — Patient Instructions (Signed)
Labs today.   Follow up in 3 months.  

## 2016-04-26 NOTE — Progress Notes (Signed)
Subjective:    Patient ID: Guy Phillips, male    DOB: 08-19-1941, 75 y.o.   MRN: [id number]  HPI  75YO male presents for follow up.  DM - BG well controlled, near 100 fasting. Has not started Januvia.   Lab Results  Component Value Date   HGBA1C 6.9 (H) 01/24/2016   Feeling well. No CP, HA, palpitations. Seen by Dr. Michaelle Birks in cardiology at [facility] in 01/2016. No changes made.  Wt Readings from Last 3 Encounters:  04/26/16 233 lb 9.6 oz (106 kg)  04/24/16 233 lb 6.4 oz (105.9 kg)  01/24/16 235 lb (106.6 kg)   BP Readings from Last 3 Encounters:  04/26/16 106/68  04/24/16 128/62  01/24/16 140/80    Past Medical History:  Diagnosis Date  . CAD (coronary artery disease)    s/p MI 2011 Cath showed 100% occlusion of RCA with collaterals open, followed by Dr. Ubaldo Glassing  . Diabetes mellitus Dx 2005   HgbA1c 6.8%, Optho [facility] 2001  . Hernia    Umbilical  . Hyperlipidemia   . Hypertension   . Kidney stone    2008  . 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 Evorn Gong   No family history on file. Past Surgical History:  Procedure Laterality Date  . CORONARY ARTERY BYPASS GRAFT  2015   with AVR  . FUNCTIONAL ENDOSCOPIC SINUS SURGERY     Dr. Fatima Sanger in [place] History  . Marital status: Married    Spouse name: N/A  . Number of children: N/A  . Years of education: N/A   Social History Main Topics  . Smoking status: Former Smoker    Quit date: 07/03/1991  . Smokeless tobacco: Never Used  . Alcohol use No  . Drug use: No  . Sexual activity: No   Other Topics Concern  . None   Social History Narrative  . None    Review of Systems  Constitutional: Negative for activity change, appetite change, chills, fatigue, fever and unexpected weight change.  HENT: Negative for congestion.   Eyes: Negative for visual disturbance.  Respiratory: Negative for cough, chest tightness and shortness of breath.   Cardiovascular:  Negative for chest pain, palpitations and leg swelling.  Gastrointestinal: Negative for abdominal distention, abdominal pain, constipation, diarrhea, nausea and vomiting.  Genitourinary: Negative for difficulty urinating, dysuria and urgency.  Musculoskeletal: Negative for arthralgias and gait problem.  Skin: Negative for color change and rash.  Neurological: Negative for weakness.  Hematological: Negative for adenopathy.  Psychiatric/Behavioral: Negative for dysphoric mood and sleep disturbance. The patient is not nervous/anxious.        Objective:    BP 106/68 (BP Location: Left Arm, Patient Position: Sitting, Cuff Size: Large)   Pulse 79   Ht 5\' 8"  (1.727 m)   Wt 233 lb 9.6 oz (106 kg)   SpO2 98%   BMI 35.52 kg/m  Physical Exam  Constitutional: He is oriented to person, place, and time. He appears well-developed and well-nourished. No distress.  HENT:  Head: Normocephalic and atraumatic.  Right Ear: External ear normal.  Left Ear: External ear normal.  Nose: Nose normal.  Mouth/Throat: Oropharynx is clear and moist. No oropharyngeal exudate.  Eyes: Conjunctivae and EOM are normal. Pupils are equal, round, and reactive to light. Right eye exhibits no discharge. Left eye exhibits no discharge. No scleral icterus.  Neck: Normal range of motion. Neck supple. No tracheal deviation  present. No thyromegaly present.  Cardiovascular: Normal rate, regular rhythm and normal heart sounds.  Exam reveals no gallop and no friction rub.   No murmur heard. Pulmonary/Chest: Effort normal and breath sounds normal. No accessory muscle usage. No tachypnea. No respiratory distress. He has no decreased breath sounds. He has no wheezes. He has no rhonchi. He has no rales. He exhibits no tenderness.  Musculoskeletal: Normal range of motion. He exhibits no edema.  Lymphadenopathy:    He has no cervical adenopathy.  Neurological: He is alert and oriented to person, place, and time. No cranial nerve  deficit. Coordination normal.  Skin: Skin is warm and dry. No rash noted. He is not diaphoretic. No erythema. No pallor.  Psychiatric: He has a normal mood and affect. His behavior is normal. Judgment and thought content normal.          Assessment & Plan:   Problem List Items Addressed This Visit      Unprioritized   CAD (coronary artery disease) (Chronic)    Reviewed notes from [place]. Symptomatically, doing well. Continue current medications.      Relevant Medications   rosuvastatin (CRESTOR) 20 MG tablet   Diabetes mellitus type 2 in obese ([place]) - Primary (Chronic)    BG well controlled. Will check A1c with labs. Consider starting Januvia (he has already picked up medication), if BG elevated.      Relevant Medications   rosuvastatin (CRESTOR) 20 MG tablet   Other Relevant Orders   Comprehensive metabolic panel   Hemoglobin A1c   Lipid panel   Microalbumin / creatinine urine ratio   Hyperlipidemia    Check lipids and LFTs with labs.      Relevant Medications   rosuvastatin (CRESTOR) 20 MG tablet   Hypertension (Chronic)    BP Readings from Last 3 Encounters:  04/26/16 106/68  04/24/16 128/62  01/24/16 140/80   BP well controlled. Continue current medication.      Relevant Medications   rosuvastatin (CRESTOR) 20 MG tablet    Other Visit Diagnoses   None.      Return in about 3 months (around 07/27/2016) for New Patient.  Ronette Deter, MD Internal [facility]

## 2016-04-26 NOTE — Progress Notes (Signed)
Pre visit review using our clinic review tool, if applicable. No additional management support is needed unless otherwise documented below in the visit note. 

## 2016-04-26 NOTE — Assessment & Plan Note (Signed)
Check lipids and LFTs with labs. 

## 2016-04-26 NOTE — Assessment & Plan Note (Signed)
BG well controlled. Will check A1c with labs. Consider starting Januvia (he has already picked up medication), if BG elevated.

## 2016-04-26 NOTE — Assessment & Plan Note (Signed)
Reviewed notes from [place]. Symptomatically, doing well. Continue current medications.

## 2016-05-22 DIAGNOSIS — M9902 Segmental and somatic dysfunction of thoracic region: Secondary | ICD-10-CM | POA: Diagnosis not present

## 2016-05-22 DIAGNOSIS — M9903 Segmental and somatic dysfunction of lumbar region: Secondary | ICD-10-CM | POA: Diagnosis not present

## 2016-05-22 DIAGNOSIS — M62830 Muscle spasm of back: Secondary | ICD-10-CM | POA: Diagnosis not present

## 2016-05-22 DIAGNOSIS — M5416 Radiculopathy, lumbar region: Secondary | ICD-10-CM | POA: Diagnosis not present

## 2016-06-04 ENCOUNTER — Ambulatory Visit (INDEPENDENT_AMBULATORY_CARE_PROVIDER_SITE_OTHER): Payer: Medicare Other

## 2016-06-04 DIAGNOSIS — Z23 Encounter for immunization: Secondary | ICD-10-CM

## 2016-06-19 DIAGNOSIS — M62830 Muscle spasm of back: Secondary | ICD-10-CM | POA: Diagnosis not present

## 2016-06-19 DIAGNOSIS — M5416 Radiculopathy, lumbar region: Secondary | ICD-10-CM | POA: Diagnosis not present

## 2016-06-19 DIAGNOSIS — M9903 Segmental and somatic dysfunction of lumbar region: Secondary | ICD-10-CM | POA: Diagnosis not present

## 2016-06-19 DIAGNOSIS — M9902 Segmental and somatic dysfunction of thoracic region: Secondary | ICD-10-CM | POA: Diagnosis not present

## 2016-07-19 DIAGNOSIS — M5416 Radiculopathy, lumbar region: Secondary | ICD-10-CM | POA: Diagnosis not present

## 2016-07-19 DIAGNOSIS — M9903 Segmental and somatic dysfunction of lumbar region: Secondary | ICD-10-CM | POA: Diagnosis not present

## 2016-07-19 DIAGNOSIS — M62830 Muscle spasm of back: Secondary | ICD-10-CM | POA: Diagnosis not present

## 2016-07-19 DIAGNOSIS — M9902 Segmental and somatic dysfunction of thoracic region: Secondary | ICD-10-CM | POA: Diagnosis not present

## 2016-07-31 ENCOUNTER — Ambulatory Visit (INDEPENDENT_AMBULATORY_CARE_PROVIDER_SITE_OTHER): Payer: Medicare Other | Admitting: Family Medicine

## 2016-07-31 ENCOUNTER — Encounter: Payer: Self-pay | Admitting: Family Medicine

## 2016-07-31 VITALS — BP 132/70 | HR 75 | Temp 98.4°F | Wt 236.8 lb

## 2016-07-31 DIAGNOSIS — I2510 Atherosclerotic heart disease of native coronary artery without angina pectoris: Secondary | ICD-10-CM | POA: Diagnosis not present

## 2016-07-31 DIAGNOSIS — E785 Hyperlipidemia, unspecified: Secondary | ICD-10-CM | POA: Diagnosis not present

## 2016-07-31 DIAGNOSIS — E1169 Type 2 diabetes mellitus with other specified complication: Secondary | ICD-10-CM | POA: Diagnosis not present

## 2016-07-31 DIAGNOSIS — E1142 Type 2 diabetes mellitus with diabetic polyneuropathy: Secondary | ICD-10-CM | POA: Diagnosis not present

## 2016-07-31 DIAGNOSIS — E669 Obesity, unspecified: Secondary | ICD-10-CM

## 2016-07-31 DIAGNOSIS — N3941 Urge incontinence: Secondary | ICD-10-CM

## 2016-07-31 DIAGNOSIS — I10 Essential (primary) hypertension: Secondary | ICD-10-CM

## 2016-07-31 LAB — HEMOGLOBIN A1C: HEMOGLOBIN A1C: 7 % — AB (ref 4.6–6.5)

## 2016-07-31 NOTE — Progress Notes (Signed)
  Tommi Rumps, MD Phone: [phone]  Guy Phillips is a 75 y.o. male who presents today for f/u.  HYPERTENSION  Disease Monitoring  Home BP Monitoring not checking Chest pain- no    Dyspnea- no Medications  Compliance-  taking triamterene-HCTZ, metoprolol.  Edema- no  DIABETES Disease Monitoring: Blood Sugar ranges-96-174, mostly less than 150 Polyuria/phagia/dipsia- no      Medications: Compliance- taking metformin, glipizide Hypoglycemic symptoms- occurs if he does not eat lunch, resolves if he eats  HYPERLIPIDEMIA Disease Monitoring: See symptoms for Hypertension Medications: Compliance- taking Crestor Right upper quadrant pain- no  Muscle aches- no  Neuropathy: Patient notes bilateral neuropathy in his feet. Mostly on the bottom of his feet. No tingling or burning on the bottom feet. Notes some numbness. Some mild tingling on the dorsum of his feet. Does not take any medications for this.  Urge incontinence: Patient notes issues with urgency. If he can't get to the bathroom quickly enough he does leak. He notes no straining or starting and stopping of his urinary stream. He is on Toviaz and this is beneficial though does cause some constipation. He wants to know if there is any additional medication to try for this.   PMH: Former smoker   ROS see history of present illness  Objective  Physical Exam Vitals:   07/31/16 0859  BP: 132/70  Pulse: 75  Temp: 98.4 F (36.9 C)    BP Readings from Last 3 Encounters:  07/31/16 132/70  04/26/16 106/68  04/24/16 128/62   Wt Readings from Last 3 Encounters:  07/31/16 236 lb 12.8 oz (107.4 kg)  04/26/16 233 lb 9.6 oz (106 kg)  04/24/16 233 lb 6.4 oz (105.9 kg)    Physical Exam  Constitutional: He is well-developed, well-nourished, and in no distress.  Cardiovascular: Normal rate, regular rhythm and normal heart sounds.   Pulmonary/Chest: Effort normal and breath sounds normal.  Musculoskeletal: He exhibits no  edema.  Neurological: He is alert. Gait normal.  Skin: Skin is warm and dry.   Diabetic Foot Exam - Simple   Simple Foot Form Diabetic Foot exam was performed with the following findings:  Yes 07/31/2016  9:49 AM  Visual Inspection No deformities, no ulcerations, no other skin breakdown bilaterally:  Yes Sensation Testing See comments:  Yes Pulse Check Posterior Tibialis and Dorsalis pulse intact bilaterally:  Yes Comments Normal light touch sensation on the dorsum of his feet, decreased on the plantar surface of his feet, monofilament decreased somewhat in right toes, otherwise normal in bilateral feet     Assessment/Plan: Please see individual problem list.  Hypertension Well-controlled. Continue current medications.  Diabetes mellitus type 2 in obese [facility]) Mostly well controlled. We'll check an A1c today. Foot exam completed.  Neuropathy, diabetic ([place]) Continues to have neuropathy symptoms. Offered gabapentin to help with this though he deferred. We'll continue to monitor. Gave precautions to seek medical attention regarding foot wounds.  Hyperlipidemia Tolerating Crestor. He will continue this.  Urge incontinence Patient with urge incontinence. Well-controlled with Toviaz. Does have some constipation with this. I offered Myrbetriq though he deferred at this time. He'll continue to monitor.   Orders Placed This Encounter  Procedures  . HgB A1c    Tommi Rumps, MD [place]

## 2016-07-31 NOTE — Assessment & Plan Note (Signed)
Well controlled. Continue current medications  

## 2016-07-31 NOTE — Assessment & Plan Note (Signed)
Patient with urge incontinence. Well-controlled with Toviaz. Does have some constipation with this. I offered Myrbetriq though he deferred at this time. He'll continue to monitor.

## 2016-07-31 NOTE — Assessment & Plan Note (Signed)
Mostly well controlled. We'll check an A1c today. Foot exam completed.

## 2016-07-31 NOTE — Progress Notes (Signed)
Pre visit review using our clinic review tool, if applicable. No additional management support is needed unless otherwise documented below in the visit note. 

## 2016-07-31 NOTE — Patient Instructions (Signed)
Nice to see you. We will check an A1c today and call you with the results. If you like to consider myrbetriq or gabapentin in the future please give [place] a call.

## 2016-07-31 NOTE — Assessment & Plan Note (Signed)
Continues to have neuropathy symptoms. Offered gabapentin to help with this though he deferred. We'll continue to monitor. Gave precautions to seek medical attention regarding foot wounds.

## 2016-07-31 NOTE — Assessment & Plan Note (Signed)
Tolerating Crestor. He will continue this.

## 2016-09-02 ENCOUNTER — Encounter: Payer: Self-pay | Admitting: Family Medicine

## 2016-09-02 ENCOUNTER — Ambulatory Visit (INDEPENDENT_AMBULATORY_CARE_PROVIDER_SITE_OTHER): Payer: Medicare Other | Admitting: Family Medicine

## 2016-09-02 DIAGNOSIS — J069 Acute upper respiratory infection, unspecified: Secondary | ICD-10-CM | POA: Diagnosis not present

## 2016-09-02 DIAGNOSIS — B9789 Other viral agents as the cause of diseases classified elsewhere: Secondary | ICD-10-CM

## 2016-09-02 DIAGNOSIS — I2510 Atherosclerotic heart disease of native coronary artery without angina pectoris: Secondary | ICD-10-CM

## 2016-09-02 NOTE — Progress Notes (Signed)
Pre visit review using our clinic review tool, if applicable. No additional management support is needed unless otherwise documented below in the visit note. 

## 2016-09-02 NOTE — Patient Instructions (Signed)
Nice to see you. You likely have a viral upper respiratory infection. You can try over-the-counter guaifenesin if the Robitussin does not have this in it. Continue the Flonase. You can add Claritin or Zyrtec to help as well. Please stay well hydrated. If you develop fevers, cough productive of blood, or shortness of breath, or any new or changing symptoms please seek medical attention medially.

## 2016-09-02 NOTE — Progress Notes (Signed)
  Tommi Rumps, MD Phone: [phone]  Guy Phillips is a 75 y.o. male who presents today for same-day visit.  Patient notes onset of symptoms yesterday. Started with nasal congestion, postnasal drip, and clear rhinorrhea. Notes sinus congestion now. Mild nonproductive cough. No shortness of breath. No fevers. Has been using Robitussin and Flonase with some benefit. Wife had similar symptoms.  ROS see history of present illness  Objective  Physical Exam Vitals:   09/02/16 1548  BP: 136/70  Pulse: 83  Temp: 98.5 F (36.9 C)    BP Readings from Last 3 Encounters:  09/02/16 136/70  07/31/16 132/70  04/26/16 106/68   Wt Readings from Last 3 Encounters:  09/02/16 238 lb 6.4 oz (108.1 kg)  07/31/16 236 lb 12.8 oz (107.4 kg)  04/26/16 233 lb 9.6 oz (106 kg)    Physical Exam  Constitutional: No distress.  HENT:  Head: Normocephalic and atraumatic.  Mouth/Throat: Oropharynx is clear and moist. No oropharyngeal exudate.  Normal TMs bilaterally  Eyes: Conjunctivae are normal. Pupils are equal, round, and reactive to light.  Cardiovascular: Normal rate, regular rhythm and normal heart sounds.   Pulmonary/Chest: Effort normal and breath sounds normal. No respiratory distress. He has no wheezes.  Musculoskeletal: He exhibits no edema.  Neurological: He is alert.  Skin: He is not diaphoretic.     Assessment/Plan: Please see individual problem list.  Viral upper respiratory infection Patient's symptoms and exam most consistent with viral upper respiratory infection. Discussed supportive care. Continue Flonase and Robitussin. They can add guaifenesin if Robitussin does not have this in it already. Add Zyrtec or Claritin. Still hydrated. Given return precautions.   Tommi Rumps, MD [place]

## 2016-09-02 NOTE — Assessment & Plan Note (Signed)
Patient's symptoms and exam most consistent with viral upper respiratory infection. Discussed supportive care. Continue Flonase and Robitussin. They can add guaifenesin if Robitussin does not have this in it already. Add Zyrtec or Claritin. Still hydrated. Given return precautions.

## 2016-10-23 ENCOUNTER — Other Ambulatory Visit: Payer: Self-pay | Admitting: Family Medicine

## 2016-10-23 MED ORDER — GLIPIZIDE 5 MG PO TABS: ORAL_TABLET | ORAL | 3 refills | Status: DC

## 2016-10-23 NOTE — Telephone Encounter (Signed)
Last OV 09/02/16 last filled by Dr.Walker 11/06/15 180 3rf

## 2016-10-23 NOTE — Telephone Encounter (Signed)
glipiZIDE (GLUCOTROL) 5 MG tablet Take one tablet by mouth twice a day before meals. Qty 180. Refills 3.

## 2016-10-25 DIAGNOSIS — L910 Hypertrophic scar: Secondary | ICD-10-CM | POA: Diagnosis not present

## 2016-10-25 DIAGNOSIS — Z85828 Personal history of other malignant neoplasm of skin: Secondary | ICD-10-CM | POA: Diagnosis not present

## 2016-10-25 DIAGNOSIS — L570 Actinic keratosis: Secondary | ICD-10-CM | POA: Diagnosis not present

## 2016-10-25 DIAGNOSIS — L821 Other seborrheic keratosis: Secondary | ICD-10-CM | POA: Diagnosis not present

## 2016-10-25 DIAGNOSIS — L853 Xerosis cutis: Secondary | ICD-10-CM | POA: Diagnosis not present

## 2016-10-29 DIAGNOSIS — H40013 Open angle with borderline findings, low risk, bilateral: Secondary | ICD-10-CM | POA: Diagnosis not present

## 2016-10-29 DIAGNOSIS — H43811 Vitreous degeneration, right eye: Secondary | ICD-10-CM | POA: Diagnosis not present

## 2016-10-29 DIAGNOSIS — H25813 Combined forms of age-related cataract, bilateral: Secondary | ICD-10-CM | POA: Diagnosis not present

## 2016-11-05 ENCOUNTER — Ambulatory Visit (INDEPENDENT_AMBULATORY_CARE_PROVIDER_SITE_OTHER): Payer: Medicare Other | Admitting: Family Medicine

## 2016-11-05 ENCOUNTER — Encounter: Payer: Self-pay | Admitting: Family Medicine

## 2016-11-05 VITALS — BP 120/62 | HR 62 | Temp 98.4°F | Wt 234.8 lb

## 2016-11-05 DIAGNOSIS — E669 Obesity, unspecified: Secondary | ICD-10-CM

## 2016-11-05 DIAGNOSIS — E1169 Type 2 diabetes mellitus with other specified complication: Secondary | ICD-10-CM

## 2016-11-05 DIAGNOSIS — I10 Essential (primary) hypertension: Secondary | ICD-10-CM

## 2016-11-05 DIAGNOSIS — E1142 Type 2 diabetes mellitus with diabetic polyneuropathy: Secondary | ICD-10-CM | POA: Diagnosis not present

## 2016-11-05 DIAGNOSIS — E785 Hyperlipidemia, unspecified: Secondary | ICD-10-CM

## 2016-11-05 LAB — COMPREHENSIVE METABOLIC PANEL
ALK PHOS: 67 U/L (ref 39–117)
ALT: 14 U/L (ref 0–53)
AST: 15 U/L (ref 0–37)
Albumin: 4.3 g/dL (ref 3.5–5.2)
BUN: 17 mg/dL (ref 6–23)
CO2: 29 mEq/L (ref 19–32)
Calcium: 9.7 mg/dL (ref 8.4–10.5)
Chloride: 101 mEq/L (ref 96–112)
Creatinine, Ser: 0.98 mg/dL (ref 0.40–1.50)
GFR: 79.4 mL/min (ref >60.00)
GLUCOSE: 140 mg/dL — AB (ref 70–99)
POTASSIUM: 3.9 meq/L (ref 3.5–5.1)
SODIUM: 138 meq/L (ref 135–145)
TOTAL PROTEIN: 7 g/dL (ref 6.0–8.3)
Total Bilirubin: 0.9 mg/dL (ref 0.2–1.2)

## 2016-11-05 LAB — HEMOGLOBIN A1C: Hgb A1c MFr Bld: 7 % — ABNORMAL HIGH (ref 4.6–6.5)

## 2016-11-05 NOTE — Assessment & Plan Note (Signed)
Seems to be well controlled. Check an A1c. Continue current medications.

## 2016-11-05 NOTE — Assessment & Plan Note (Signed)
Improved. Continue Vicks VapoRub on feet. Continue to monitor.

## 2016-11-05 NOTE — Assessment & Plan Note (Signed)
At goal. Continue current medications. Check CMP. 

## 2016-11-05 NOTE — Progress Notes (Signed)
Pre visit review using our clinic review tool, if applicable. No additional management support is needed unless otherwise documented below in the visit note. 

## 2016-11-05 NOTE — Progress Notes (Signed)
  Tommi Rumps, MD Phone: [phone]  Guy Phillips is a 76 y.o. male who presents today for follow-up.  HYPERTENSION Disease Monitoring: Blood pressure range-not checking Chest pain- no      Dyspnea- no Medications: Compliance- taking tiramterene/HCTZ, metoprolol   Edema- no  DIABETES Disease Monitoring: Blood Sugar ranges-95-145, once up to 200 Polyuria/phagia/dipsia- no      optho-just saw them Medications: Compliance- taking metformin, glipizide Hypoglycemic symptoms- rare if he does not eat lunch  HYPERLIPIDEMIA Disease Monitoring: See symptoms for Hypertension Medications: Compliance- taking crestor Right upper quadrant pain- no  Muscle aches- no  Note his diabetic neuropathy is improved. He started using Vicks VapoRub on his feet at night and has significantly less tingling and numbness. Does monitor his feet daily. No changes to them.   PMH: Former smoker   ROS see history of present illness  Objective  Physical Exam Vitals:   11/05/16 0911  BP: 120/62  Pulse: 62  Temp: 98.4 F (36.9 C)    BP Readings from Last 3 Encounters:  11/05/16 120/62  09/02/16 136/70  07/31/16 132/70   Wt Readings from Last 3 Encounters:  11/05/16 234 lb 12.8 oz (106.5 kg)  09/02/16 238 lb 6.4 oz (108.1 kg)  07/31/16 236 lb 12.8 oz (107.4 kg)    Physical Exam  Constitutional: No distress.  Cardiovascular: Normal rate, regular rhythm and normal heart sounds.   Pulmonary/Chest: Effort normal and breath sounds normal.  Musculoskeletal: He exhibits no edema.  Neurological: He is alert. Gait normal.  Skin: Skin is warm and dry. He is not diaphoretic.   Diabetic Foot Exam - Simple   Simple Foot Form Diabetic Foot exam was performed with the following findings:  Yes 11/05/2016  9:35 AM  Visual Inspection No deformities, no ulcerations, no other skin breakdown bilaterally:  Yes Sensation Testing Intact to touch and monofilament testing bilaterally:  Yes Pulse  Check Posterior Tibialis and Dorsalis pulse intact bilaterally:  Yes Comments     Assessment/Plan: Please see individual problem list.  Hypertension At goal. Continue current medications. Check CMP.  Diabetes mellitus type 2 in obese ([facility]) Seems to be well controlled. Check an A1c. Continue current medications.  Neuropathy, diabetic ([place]) Improved. Continue Vicks VapoRub on feet. Continue to monitor.  Hyperlipidemia Tolerating Crestor. Check CMP.   Orders Placed This Encounter  Procedures  . Comp Met (CMET)  . HgB A1c   We will request records on his prior colonoscopy.  Tommi Rumps, MD [place]

## 2016-11-05 NOTE — Patient Instructions (Signed)
Nice to see you. Very going to check some lab work today and contact you with the results. We'll try to request records for your colonoscopy.

## 2016-11-05 NOTE — Assessment & Plan Note (Signed)
Tolerating Crestor. Check CMP.

## 2016-11-27 ENCOUNTER — Other Ambulatory Visit: Payer: Self-pay | Admitting: Family Medicine

## 2016-11-27 MED ORDER — ROSUVASTATIN CALCIUM 20 MG PO TABS: 20.0000 mg | ORAL_TABLET | Freq: Every day | ORAL | 3 refills | Status: DC

## 2016-11-27 NOTE — Telephone Encounter (Signed)
Last OV 11/05/16 last filled by Dr.Walker 04/26/16 90 3rf

## 2016-11-27 NOTE — Telephone Encounter (Signed)
Pt called requesting a refill on his rosuvastatin (CRESTOR) 20 MG tablet. Please call when completed Please advise, thank you!  Pharmacy - [place], [place] [facility]  Call pt @ [phone]

## 2016-12-06 ENCOUNTER — Telehealth: Payer: Self-pay | Admitting: *Deleted

## 2016-12-06 MED ORDER — METFORMIN HCL 1000 MG PO TABS: 1000.0000 mg | ORAL_TABLET | Freq: Two times a day (BID) | ORAL | 3 refills | Status: DC

## 2016-12-06 NOTE — Telephone Encounter (Signed)
Requested medication refill for : metformin 90 day supply  Pharmacy:[facility]  Please Contact Pt when ready or sent to Pharmacy:  [phone]

## 2016-12-06 NOTE — Addendum Note (Signed)
Addended by: Bevelyn Ngo on: 12/06/2016 03:29 PM   Modules accepted: Orders

## 2016-12-06 NOTE — Telephone Encounter (Signed)
rx sent to pharmacy, patient notified. thanks

## 2017-01-01 DIAGNOSIS — H52221 Regular astigmatism, right eye: Secondary | ICD-10-CM | POA: Diagnosis not present

## 2017-01-01 DIAGNOSIS — H2511 Age-related nuclear cataract, right eye: Secondary | ICD-10-CM | POA: Diagnosis not present

## 2017-01-15 DIAGNOSIS — H52222 Regular astigmatism, left eye: Secondary | ICD-10-CM | POA: Diagnosis not present

## 2017-01-15 DIAGNOSIS — H2512 Age-related nuclear cataract, left eye: Secondary | ICD-10-CM | POA: Diagnosis not present

## 2017-02-07 ENCOUNTER — Ambulatory Visit: Payer: Medicare Other | Admitting: Family Medicine

## 2017-02-14 ENCOUNTER — Ambulatory Visit: Payer: Medicare Other | Admitting: Family Medicine

## 2017-02-26 DIAGNOSIS — Z952 Presence of prosthetic heart valve: Secondary | ICD-10-CM | POA: Diagnosis not present

## 2017-02-26 DIAGNOSIS — Z951 Presence of aortocoronary bypass graft: Secondary | ICD-10-CM | POA: Diagnosis not present

## 2017-02-26 DIAGNOSIS — I2510 Atherosclerotic heart disease of native coronary artery without angina pectoris: Secondary | ICD-10-CM | POA: Diagnosis not present

## 2017-03-15 ENCOUNTER — Telehealth: Payer: Self-pay | Admitting: Family Medicine

## 2017-03-15 DIAGNOSIS — Z1211 Encounter for screening for malignant neoplasm of colon: Secondary | ICD-10-CM

## 2017-03-15 NOTE — Telephone Encounter (Signed)
Please let the patient know that his prior colonoscopy was done in 2011. They recommended a 5 year repeat. I would like to refer him to GI for this. Please see what he would like to do. Thanks.

## 2017-03-17 NOTE — Addendum Note (Signed)
Addended by: Leone Haven on: 03/17/2017 12:24 PM   Modules accepted: Orders

## 2017-03-17 NOTE — Telephone Encounter (Signed)
Patient states he is ok with referral for repeat colonoscopy

## 2017-03-17 NOTE — Telephone Encounter (Signed)
Referral placed.

## 2017-03-19 ENCOUNTER — Ambulatory Visit (INDEPENDENT_AMBULATORY_CARE_PROVIDER_SITE_OTHER): Payer: Medicare Other | Admitting: Family Medicine

## 2017-03-19 ENCOUNTER — Encounter: Payer: Self-pay | Admitting: Family Medicine

## 2017-03-19 VITALS — BP 122/74 | HR 73 | Temp 98.5°F | Wt 230.6 lb

## 2017-03-19 DIAGNOSIS — E785 Hyperlipidemia, unspecified: Secondary | ICD-10-CM | POA: Diagnosis not present

## 2017-03-19 DIAGNOSIS — E1169 Type 2 diabetes mellitus with other specified complication: Secondary | ICD-10-CM

## 2017-03-19 DIAGNOSIS — G8929 Other chronic pain: Secondary | ICD-10-CM

## 2017-03-19 DIAGNOSIS — I2510 Atherosclerotic heart disease of native coronary artery without angina pectoris: Secondary | ICD-10-CM | POA: Diagnosis not present

## 2017-03-19 DIAGNOSIS — M545 Low back pain, unspecified: Secondary | ICD-10-CM | POA: Insufficient documentation

## 2017-03-19 DIAGNOSIS — I10 Essential (primary) hypertension: Secondary | ICD-10-CM | POA: Diagnosis not present

## 2017-03-19 DIAGNOSIS — E669 Obesity, unspecified: Secondary | ICD-10-CM

## 2017-03-19 LAB — POCT GLYCOSYLATED HEMOGLOBIN (HGB A1C): Hemoglobin A1C: 6.7

## 2017-03-19 MED ORDER — GLUCOSE BLOOD VI STRP: ORAL_STRIP | 12 refills | Status: DC

## 2017-03-19 NOTE — Assessment & Plan Note (Signed)
Asymptomatic. Monitor for symptoms. Continue current medications.

## 2017-03-19 NOTE — Patient Instructions (Signed)
Nice to see you. Please continue your current blood pressure and diabetes medications and cholesterol medications. Please monitor your back pain as well. If this worsens please let us know. If you develop numbness, weakness, loss of bowel or bladder function, numbness between her legs, or any new or changing symptoms please see medical attention.

## 2017-03-19 NOTE — Assessment & Plan Note (Signed)
At goal. Continue current medications. 

## 2017-03-19 NOTE — Assessment & Plan Note (Signed)
Chronic mild low back discomfort. Discussed continuing stretches and continuing to monitor.

## 2017-03-19 NOTE — Progress Notes (Signed)
  Tommi Rumps, MD Phone: [phone]  Guy Phillips is a 76 y.o. male who presents today for f/u.  HYPERTENSION Disease Monitoring: Blood pressure range-not checking Chest pain- no      Dyspnea- no Medications: Compliance- taking metoprolol, dyazide   Edema- no  DIABETES Disease Monitoring: Blood Sugar ranges-<130 Polyuria/phagia/dipsia- no      Optho- UTD Medications: Compliance- taking glipizide, metformin Hypoglycemic symptoms- rare, eats something and improves  HYPERLIPIDEMIA Disease Monitoring: See symptoms for Hypertension Medications: Compliance- taking crestor Right upper quadrant pain- no  Muscle aches- no  Patient notes chronic right low back discomfort. No radiation. No numbness or weakness. No saddle anesthesia. No changes in bowel or bladder function. Has not taken medication for this. Notes it improves after stretching out in the morning.  PMH: Former smoker   ROS see history of present illness  Objective  Physical Exam Vitals:   03/19/17 1345  BP: 122/74  Pulse: 73  Temp: 98.5 F (36.9 C)    BP Readings from Last 3 Encounters:  03/19/17 122/74  11/05/16 120/62  09/02/16 136/70   Wt Readings from Last 3 Encounters:  03/19/17 230 lb 9.6 oz (104.6 kg)  11/05/16 234 lb 12.8 oz (106.5 kg)  09/02/16 238 lb 6.4 oz (108.1 kg)    Physical Exam  Constitutional: No distress.  Cardiovascular: Normal rate, regular rhythm and normal heart sounds.   Pulmonary/Chest: Effort normal and breath sounds normal.  Musculoskeletal: He exhibits no edema.  No midline spine tenderness, no midline spine step-off, no muscular back tenderness  Neurological: He is alert. Gait normal.  5/5 strength bilateral quads, hamstrings, plantar flexion, dorsiflexion, sensation light touch intact bilateral extremities  Skin: He is not diaphoretic.     Assessment/Plan: Please see individual problem list.  Hypertension At goal. Continue current medications.  CAD  (coronary artery disease) Asymptomatic. Monitor for symptoms. Continue current medications.  Diabetes mellitus type 2 in obese ([place]) Sugars well controlled. Check A1c. Continue current medications.  Hyperlipidemia Tolerating Crestor. Continue Crestor.  Low back pain Chronic mild low back discomfort. Discussed continuing stretches and continuing to monitor.   Orders Placed This Encounter  Procedures  . POCT HgB A1C    Meds ordered this encounter  Medications  . glucose blood (ONE TOUCH ULTRA TEST) test strip    Sig: Use as instructed    Dispense:  100 each    Refill:  [place], MD [place]

## 2017-03-19 NOTE — Assessment & Plan Note (Signed)
Tolerating Crestor.  Continue Crestor. 

## 2017-03-19 NOTE — Assessment & Plan Note (Signed)
Sugars well controlled. Check A1c. Continue current medications.

## 2017-04-08 ENCOUNTER — Telehealth: Payer: Self-pay

## 2017-04-08 ENCOUNTER — Other Ambulatory Visit: Payer: Self-pay

## 2017-04-08 DIAGNOSIS — Z1211 Encounter for screening for malignant neoplasm of colon: Secondary | ICD-10-CM

## 2017-04-08 NOTE — Telephone Encounter (Signed)
Gastroenterology Pre-Procedure Review  Request Date: 04/28/17 Requesting Physician: Dr. Allen Norris  PATIENT REVIEW QUESTIONS: The patient responded to the following health history questions as indicated:    1. Are you having any GI issues? no 2. Do you have a personal history of Polyps? yes (2009-2010) 3. Do you have a family history of Colon Cancer or Polyps? no 4. Diabetes Mellitus? yes (type 2) 5. Joint replacements in the past 12 months?no 6. Major health problems in the past 3 months?no 7. Any artificial heart valves, MVP, or defibrillator?no    MEDICATIONS & ALLERGIES:    Patient reports the following regarding taking any anticoagulation/antiplatelet therapy:   Plavix, Coumadin, Eliquis, Xarelto, Lovenox, Pradaxa, Brilinta, or Effient? no Aspirin? yes  Patient confirms/reports the following medications:  Current Outpatient Prescriptions  Medication Sig Dispense Refill  . acetaminophen (TYLENOL) 325 MG tablet Take by mouth.    Marland Kitchen aspirin 81 MG tablet Take 81 mg by mouth daily.      . Blood Glucose Monitoring Suppl (ONE TOUCH ULTRA SYSTEM KIT) w/Device KIT 1 kit by Does not apply route once. 1 each 0  . Cholecalciferol (VITAMIN D3) 2000 UNITS TABS Take 1 tablet by mouth daily.      . fesoterodine (TOVIAZ) 8 MG TB24 Take 1 tablet (8 mg total) by mouth daily. 90 tablet 3  . fexofenadine (ALLEGRA) 180 MG tablet Take 180 mg by mouth daily.    . fluticasone (FLONASE) 50 MCG/ACT nasal spray Place into the nose Daily.    Marland Kitchen glipiZIDE (GLUCOTROL) 5 MG tablet TAKE 1 TABLET BY MOUTH TWICE A DAY BEFORE MEALS 180 tablet 3  . glucose blood (ONE TOUCH ULTRA TEST) test strip Use as instructed 100 each 12  . metFORMIN (GLUCOPHAGE) 1000 MG tablet Take 1 tablet (1,000 mg total) by mouth 2 (two) times daily with a meal. 180 tablet 3  . metoprolol tartrate (LOPRESSOR) 25 MG tablet Take 25 mg by mouth as directed. Take half tablet in the am and half in pm    . Multiple Vitamin (MULTIVITAMIN) tablet Take 1  tablet by mouth daily.      . nitroGLYCERIN (NITROSTAT) 0.4 MG SL tablet Place 0.4 mg under the tongue every 5 (five) minutes as needed.    . rosuvastatin (CRESTOR) 20 MG tablet Take 1 tablet (20 mg total) by mouth daily. 90 tablet 3  . triamterene-hydrochlorothiazide (DYAZIDE) 37.5-25 MG capsule Take 1 capsule by mouth daily.     No current facility-administered medications for this visit.     Patient confirms/reports the following allergies:  No Known Allergies  No orders of the defined types were placed in this encounter.   AUTHORIZATION INFORMATION Primary Insurance: 1D#: Group #:  Secondary Insurance: 1D#: Group #:  SCHEDULE INFORMATION: Date: 04/28/17  Time: Location:[facility]

## 2017-04-21 ENCOUNTER — Encounter: Payer: Self-pay | Admitting: *Deleted

## 2017-04-21 NOTE — Anesthesia Preprocedure Evaluation (Addendum)
Anesthesia Evaluation    Airway Mallampati: III  TM Distance: >3 FB     Dental   Pulmonary former smoker (quit 1992),    breath sounds clear to auscultation       Cardiovascular hypertension, + CAD, + Past MI (2011) and + CABG (2015 along with AVR)   Rhythm:Regular Rate:Normal     Neuro/Psych CVA (2015 (post-CABG)), No Residual Symptoms    GI/Hepatic   Endo/Other  diabetes, Type 2  Renal/GU      Musculoskeletal   Abdominal   Peds  Hematology   Anesthesia Other Findings Skin SCC  Reproductive/Obstetrics                            Anesthesia Physical Anesthesia Plan  ASA: III  Anesthesia Plan: General   Post-op Pain Management:    Induction:   PONV Risk Score and Plan:   Airway Management Planned:   Additional Equipment:   Intra-op Plan:   Post-operative Plan:   Informed Consent:   Plan Discussed with:   Anesthesia Plan Comments: (From cardiology note 02/26/2017:   Guy Phillips is a 76 y.o. male who had CABG and AVR with Dr. Norm Parcel on 05/19/2014. This was complicated by left hemispheric cortical stroke on 05/20/14. He had a bioprosthetic AVR placed. A follow-up echo revealed normal AV valve function, severe mitral calcium and normal LV.  Today he feels great and has no complaints. He just had cataract surgery without issue.  Plan:  1. Continue Dyazide 2. Continue aspirin 81mg  daily  3. RTC in one year. 4. No need for reimaging unless symptoms or exam changes. Will plan for repeat in echo in 2020 if no new changes in interim.)        Anesthesia Quick Evaluation

## 2017-04-23 NOTE — Discharge Instructions (Signed)
General Anesthesia, Adult, Care After °These instructions provide you with information about caring for yourself after your procedure. Your health care provider may also give you more specific instructions. Your treatment has been planned according to current medical practices, but problems sometimes occur. Call your health care provider if you have any problems or questions after your procedure. °What can I expect after the procedure? °After the procedure, it is common to have: °· Vomiting. °· A sore throat. °· Mental slowness. ° °It is common to feel: °· Nauseous. °· Cold or shivery. °· Sleepy. °· Tired. °· Sore or achy, even in parts of your body where you did not have surgery. ° °Follow these instructions at home: °For at least 24 hours after the procedure: °· Do not: °? Participate in activities where you could fall or become injured. °? Drive. °? Use heavy machinery. °? Drink alcohol. °? Take sleeping pills or medicines that cause drowsiness. °? Make important decisions or sign legal documents. °? Take care of children on your own. °· Rest. °Eating and drinking °· If you vomit, drink water, juice, or soup when you can drink without vomiting. °· Drink enough fluid to keep your urine clear or pale yellow. °· Make sure you have little or no nausea before eating solid foods. °· Follow the diet recommended by your health care provider. °General instructions °· Have a responsible adult stay with you until you are awake and alert. °· Return to your normal activities as told by your health care provider. Ask your health care provider what activities are safe for you. °· Take over-the-counter and prescription medicines only as told by your health care provider. °· If you smoke, do not smoke without supervision. °· Keep all follow-up visits as told by your health care provider. This is important. °Contact a health care provider if: °· You continue to have nausea or vomiting at home, and medicines are not helpful. °· You  cannot drink fluids or start eating again. °· You cannot urinate after 8-12 hours. °· You develop a skin rash. °· You have fever. °· You have increasing redness at the site of your procedure. °Get help right away if: °· You have difficulty breathing. °· You have chest pain. °· You have unexpected bleeding. °· You feel that you are having a life-threatening or urgent problem. °This information is not intended to replace advice given to you by your health care provider. Make sure you discuss any questions you have with your health care provider. °Document Released: 12/16/2000 Document Revised: 02/12/2016 Document Reviewed: 08/24/2015 °Elsevier Interactive Patient Education © 2018 [employer] ° °

## 2017-04-24 ENCOUNTER — Ambulatory Visit: Payer: Medicare Other

## 2017-04-25 ENCOUNTER — Ambulatory Visit (INDEPENDENT_AMBULATORY_CARE_PROVIDER_SITE_OTHER): Payer: Medicare Other

## 2017-04-25 VITALS — BP 124/70 | HR 72 | Temp 98.0°F | Resp 16 | Ht 68.0 in | Wt 226.1 lb

## 2017-04-25 DIAGNOSIS — L570 Actinic keratosis: Secondary | ICD-10-CM | POA: Diagnosis not present

## 2017-04-25 DIAGNOSIS — Z1389 Encounter for screening for other disorder: Secondary | ICD-10-CM

## 2017-04-25 DIAGNOSIS — Z85828 Personal history of other malignant neoplasm of skin: Secondary | ICD-10-CM | POA: Diagnosis not present

## 2017-04-25 DIAGNOSIS — Z0000 Encounter for general adult medical examination without abnormal findings: Secondary | ICD-10-CM

## 2017-04-25 DIAGNOSIS — Z1331 Encounter for screening for depression: Secondary | ICD-10-CM

## 2017-04-25 DIAGNOSIS — L821 Other seborrheic keratosis: Secondary | ICD-10-CM | POA: Diagnosis not present

## 2017-04-25 NOTE — Patient Instructions (Addendum)
  Guy Phillips , Thank you for taking time to come for your Medicare Wellness Visit. I appreciate your ongoing commitment to your health goals. Please review the following plan we discussed and let me know if I can assist you in the future.   Follow up with Dr. Caryl Bis as needed.    Bring a copy of your [place] and/or Living Will to be scanned into chart.  Have a great day!  These are the goals we discussed: Goals    . Healthy Lifestyle          STAY HYDRATED AND DRINK PLENTY OF FLUIDS/WATER. LOW CARB FOODS. STAY ACTIVE. STRETCH. MAINTAIN EXERCISE ROUTINE.       This is a list of the screening recommended for you and due dates:  Health Maintenance  Topic Date Due  . Colon Cancer Screening  10/11/2014  . Flu Shot  04/23/2017  . Urine Protein Check  04/26/2017  . Hemoglobin A1C  09/18/2017  . Complete foot exam   11/05/2017  . Eye exam for diabetics  12/04/2017  . Tetanus Vaccine  10/07/2021  . Pneumonia vaccines  Completed

## 2017-04-25 NOTE — Progress Notes (Signed)
Subjective:   Guy Phillips is a 76 y.o. male who presents for Medicare Annual/Subsequent preventive examination.  Review of Systems:  No ROS.  Medicare Wellness Visit. Additional risk factors are reflected in the social history.  Cardiac Risk Factors include: advanced age (>12mn, >>31women);male gender;hypertension;diabetes mellitus     Objective:    Vitals: BP 124/70 (BP Location: Left Arm, Patient Position: Sitting, Cuff Size: Normal)   Pulse 72   Temp 98 F (36.7 C) (Oral)   Resp 16   Ht 5' 8"  (1.727 m)   Wt 226 lb 1.9 oz (102.6 kg)   SpO2 96%   BMI 34.38 kg/m   Body mass index is 34.38 kg/m.  Tobacco History  Smoking Status  . Former Smoker  . Quit date: 07/03/1991  Smokeless Tobacco  . Never Used     Counseling given: Not Answered   Past Medical History:  Diagnosis Date  . CAD (coronary artery disease)    s/p MI 2011 Cath showed 100% occlusion of RCA with collaterals open, followed by Dr. FUbaldo Glassing . Dental bridge present    also caps and implants  . Diabetes mellitus Dx 2005   HgbA1c 6.8%, Optho [facility] 2001  . Hernia    Umbilical  . Hyperlipidemia   . Hypertension   . Kidney stone    2008  . 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 DEvorn Gong . Stroke ([place] 05/20/2014   small left hemispheric cortical stroke following CABG.  No deficits  . Uses hearing aid    has.  Doesn't usually wear   Past Surgical History:  Procedure Laterality Date  . CORONARY ARTERY BYPASS GRAFT  2015   x3 vessels, with AVR  . EYE SURGERY     cataract extraction, bilateral  . FUNCTIONAL ENDOSCOPIC SIN[place]  Family History  Problem Relation Age of Onset  . Cancer Father        liver   History  Sexual Activity  . Sexual activity: No    Outpatient Encounter Prescriptions as of 04/25/2017  Medication Sig  . acetaminophen (TYLENOL) 325 MG tablet Take by mouth.  .Marland Kitchenaspirin 81 MG tablet Take 81 mg by mouth daily.     . Blood Glucose Monitoring Suppl (ONE TOUCH ULTRA SYSTEM KIT) w/Device KIT 1 kit by Does not apply route once.  . Cholecalciferol (VITAMIN D3) 2000 UNITS TABS Take 1 tablet by mouth daily.    . fexofenadine (ALLEGRA) 180 MG tablet Take 180 mg by mouth daily.  . fluticasone (FLONASE) 50 MCG/ACT nasal spray Place into the nose Daily.  .Marland KitchenglipiZIDE (GLUCOTROL) 5 MG tablet TAKE 1 TABLET BY MOUTH TWICE A DAY BEFORE MEALS  . glucose blood (ONE TOUCH ULTRA TEST) test strip Use as instructed  . metFORMIN (GLUCOPHAGE) 1000 MG tablet Take 1 tablet (1,000 mg total) by mouth 2 (two) times daily with a meal.  . metoprolol tartrate (LOPRESSOR) 25 MG tablet Take 25 mg by mouth as directed. Take half tablet in the am and half in pm  . Multiple Vitamin (MULTIVITAMIN) tablet Take 1 tablet by mouth daily.    . rosuvastatin (CRESTOR) 20 MG tablet Take 1 tablet (20 mg total) by mouth daily.  .Marland Kitchentriamterene-hydrochlorothiazide (DYAZIDE) 37.5-25 MG capsule Take 1 capsule by mouth daily.  . nitroGLYCERIN (NITROSTAT) 0.4 MG SL tablet Place 0.4 mg under the tongue every 5 (five) minutes as  needed.   No facility-administered encounter medications on file as of 04/25/2017.     Activities of Daily Living In your present state of health, do you have any difficulty performing the following activities: 04/25/2017  Hearing? Y  Vision? N  Difficulty concentrating or making decisions? N  Walking or climbing stairs? N  Dressing or bathing? N  Doing errands, shopping? N  Preparing Food and eating ? N  Using the Toilet? N  In the past six months, have you accidently leaked urine? N  Do you have problems with loss of bowel control? N  Managing your Medications? N  Managing your Finances? N  Housekeeping or managing your Housekeeping? N  Some recent data might be hidden    Patient Care Team: Leone Haven, MD as PCP - General (Family Medicine)   Assessment:    This is a routine wellness examination for [place]. The  goal of the wellness visit is to assist the patient how to close the gaps in care and create a preventative care plan for the patient.   The roster of all physicians providing medical care to patient is listed in the Snapshot section of the chart.  Taking calcium VIT D as appropriate/Osteoporosis risk reviewed.    Safety issues reviewed; Smoke and carbon monoxide detectors in the home. Firearms locked up in the home. Wears seatbelts when driving or riding with others. Patient does wear sunscreen or protective clothing when in direct sunlight. No violence in the home.  Depression- PHQ 2 &9 complete.  No signs/symptoms or verbal communication regarding little pleasure in doing things, feeling down, depressed or hopeless. No changes in sleeping, energy, eating, concentrating.  No thoughts of self harm or harm towards others.  Time spent on this topic is 8 minutes.   Patient is alert, normal appearance, oriented to person/place/and time.  Correctly identified the president of the [place], recall of 3/3 words, and performing simple calculations. Displays appropriate judgement and can read correct time from watch face.   No new identified risk were noted.  No failures at ADL's or IADL's.    BMI- discussed the importance of a healthy diet, water intake and the benefits of aerobic exercise. Educational material provided.   24 hour diet recall: Breakfast: hash brown, fruit Lunch: ham, corn, mashed potatoes Dinner: cubed steak, vegetables, mac and cheese  Daily fluid intake: 2-3 cups of caffeine, 2 cups of water  Dental- every 6 months.  Dr.Garfield.  Eye- Visual acuity not assessed per patient preference since they have regular follow up with the ophthalmologist.  Wears corrective lenses.  Sleep patterns- Sleeps 7 hours at night.  Wakes feeling rested.  Colonoscopy scheduled on 04/28/17.  Patient Concerns: None at this time. Follow up with PCP as needed.  Exercise Activities and Dietary  recommendations Current Exercise Habits: Structured exercise class, Type of exercise: treadmill;calisthenics, Time (Minutes): 40, Frequency (Times/Week): 3, Weekly Exercise (Minutes/Week): 120, Intensity: Mild  Goals    . Healthy Lifestyle          STAY HYDRATED AND DRINK PLENTY OF FLUIDS/WATER. LOW CARB FOODS. STAY ACTIVE. STRETCH. MAINTAIN EXERCISE ROUTINE.      Fall Risk Fall Risk  04/25/2017 04/24/2016 04/24/2015 10/19/2014  Falls in the past year? No No No No   Depression Screen PHQ 2/9 Scores 04/25/2017 04/24/2016 04/24/2015 10/19/2014  PHQ - 2 Score 0 0 0 0    Cognitive Function MMSE - Mini Mental State Exam 04/25/2017 04/24/2016 04/24/2015  Orientation to time 5 5 5  Orientation to Place _0 Registration _1 Attention/ Calculation _2 Recall _3 Language- name 2 objects _4 Language- repeat _5 Language- follow 3 step command _6 Language- read & follow direction _7 Write a sentence _8 Copy design _9 Total score _10 Immunization History  Administered Date(s) Administered  . Influenza Split 07/03/2011, 06/16/2012  . Influenza, High Dose Seasonal PF 06/04/2016  . Influenza,inj,Quad PF,36+ Mos 06/16/2013, 07/01/2014, 06/28/2015  . Pneumococcal Conjugate-13 12/28/2013  . Pneumococcal Polysaccharide-23 10/08/2011  . Tdap 10/08/2011  . Zoster 07/29/2008   Screening Tests Health Maintenance  Topic Date Due  . COLONOSCOPY  10/11/2014  . INFLUENZA VACCINE  04/23/2017  . URINE MICROALBUMIN  04/26/2017  . HEMOGLOBIN A1C  09/18/2017  . FOOT EXAM  11/05/2017  . OPHTHALMOLOGY EXAM  12/04/2017  . TETANUS/TDAP  10/07/2021  . PNA vac Low Risk Adult  Completed      Plan:    End of life planning; Advance aging; Advanced directives discussed. Copy of current HCPOA/Living Will requested.    I have personally reviewed and noted the following in the patient's chart:   . Medical and social history . Use of alcohol, tobacco or illicit drugs   . Current medications and supplements . Functional ability and status . Nutritional status . Physical activity . Advanced directives . List of other physicians . Hospitalizations, surgeries, and ER visits in previous 12 months . Vitals . Screenings to include cognitive, depression, and falls . Referrals and appointments  In addition, I have reviewed and discussed with patient certain preventive protocols, quality metrics, and best practice recommendations. A written personalized care plan for preventive services as well as general preventive health recommendations were provided to patient.     Varney Biles, LPN  0/04/5204

## 2017-04-28 ENCOUNTER — Ambulatory Visit: Payer: Medicare Other | Admitting: Anesthesiology

## 2017-04-28 ENCOUNTER — Encounter
Admission: RE | Disposition: A | Discharge status: Home / Self Care | Payer: Self-pay | Source: Ambulatory Visit | Attending: Gastroenterology

## 2017-04-28 ENCOUNTER — Ambulatory Visit
Admission: RE | Admit: 2017-04-28 | Discharge: 2017-04-28 | Disposition: A | Discharge status: Home / Self Care | Payer: Medicare Other | Source: Ambulatory Visit | Attending: Gastroenterology | Admitting: Gastroenterology

## 2017-04-28 DIAGNOSIS — K635 Polyp of colon: Secondary | ICD-10-CM

## 2017-04-28 DIAGNOSIS — D122 Benign neoplasm of ascending colon: Secondary | ICD-10-CM | POA: Insufficient documentation

## 2017-04-28 DIAGNOSIS — Z951 Presence of aortocoronary bypass graft: Secondary | ICD-10-CM | POA: Insufficient documentation

## 2017-04-28 DIAGNOSIS — E119 Type 2 diabetes mellitus without complications: Secondary | ICD-10-CM | POA: Insufficient documentation

## 2017-04-28 DIAGNOSIS — K621 Rectal polyp: Secondary | ICD-10-CM | POA: Insufficient documentation

## 2017-04-28 DIAGNOSIS — I2510 Atherosclerotic heart disease of native coronary artery without angina pectoris: Secondary | ICD-10-CM | POA: Insufficient documentation

## 2017-04-28 DIAGNOSIS — I10 Essential (primary) hypertension: Secondary | ICD-10-CM | POA: Diagnosis not present

## 2017-04-28 DIAGNOSIS — E785 Hyperlipidemia, unspecified: Secondary | ICD-10-CM | POA: Diagnosis not present

## 2017-04-28 DIAGNOSIS — Z87891 Personal history of nicotine dependence: Secondary | ICD-10-CM | POA: Diagnosis not present

## 2017-04-28 DIAGNOSIS — Z8673 Personal history of transient ischemic attack (TIA), and cerebral infarction without residual deficits: Secondary | ICD-10-CM | POA: Insufficient documentation

## 2017-04-28 DIAGNOSIS — Z860100 Personal history of colon polyps, unspecified: Secondary | ICD-10-CM

## 2017-04-28 DIAGNOSIS — Z79899 Other long term (current) drug therapy: Secondary | ICD-10-CM | POA: Insufficient documentation

## 2017-04-28 DIAGNOSIS — K5730 Diverticulosis of large intestine without perforation or abscess without bleeding: Secondary | ICD-10-CM | POA: Insufficient documentation

## 2017-04-28 DIAGNOSIS — Z7982 Long term (current) use of aspirin: Secondary | ICD-10-CM | POA: Diagnosis not present

## 2017-04-28 DIAGNOSIS — Z7984 Long term (current) use of oral hypoglycemic drugs: Secondary | ICD-10-CM | POA: Diagnosis not present

## 2017-04-28 DIAGNOSIS — Z85828 Personal history of other malignant neoplasm of skin: Secondary | ICD-10-CM | POA: Diagnosis not present

## 2017-04-28 DIAGNOSIS — K648 Other hemorrhoids: Secondary | ICD-10-CM | POA: Insufficient documentation

## 2017-04-28 DIAGNOSIS — D128 Benign neoplasm of rectum: Secondary | ICD-10-CM | POA: Diagnosis not present

## 2017-04-28 DIAGNOSIS — D125 Benign neoplasm of sigmoid colon: Secondary | ICD-10-CM | POA: Diagnosis not present

## 2017-04-28 DIAGNOSIS — Z7952 Long term (current) use of systemic steroids: Secondary | ICD-10-CM | POA: Diagnosis not present

## 2017-04-28 DIAGNOSIS — Z1211 Encounter for screening for malignant neoplasm of colon: Secondary | ICD-10-CM | POA: Diagnosis not present

## 2017-04-28 DIAGNOSIS — I252 Old myocardial infarction: Secondary | ICD-10-CM | POA: Insufficient documentation

## 2017-04-28 HISTORY — PX: POLYPECTOMY: SHX5525

## 2017-04-28 HISTORY — PX: COLONOSCOPY WITH PROPOFOL: SHX5780

## 2017-04-28 HISTORY — DX: Presence of external hearing-aid: Z97.4

## 2017-04-28 HISTORY — DX: Presence of dental prosthetic device (complete) (partial): Z97.2

## 2017-04-28 HISTORY — DX: Cerebral infarction, unspecified: I63.9

## 2017-04-28 LAB — GLUCOSE, CAPILLARY
GLUCOSE-CAPILLARY: 133 mg/dL — AB (ref 65–99)
Glucose-Capillary: 85 mg/dL (ref 65–99)

## 2017-04-28 SURGERY — COLONOSCOPY WITH PROPOFOL
Anesthesia: General | Wound class: Contaminated

## 2017-04-28 MED ORDER — PROPOFOL 10 MG/ML IV BOLUS
INTRAVENOUS | Status: DC | PRN
  Administered 2017-04-28: 20 mg via INTRAVENOUS
  Administered 2017-04-28: 100 mg via INTRAVENOUS
  Administered 2017-04-28 (×3): 20 mg via INTRAVENOUS
  Administered 2017-04-28: 40 mg via INTRAVENOUS

## 2017-04-28 MED ORDER — STERILE WATER FOR IRRIGATION IR SOLN
Status: DC | PRN
  Administered 2017-04-28: 09:00:00

## 2017-04-28 MED ORDER — LIDOCAINE HCL (CARDIAC) 20 MG/ML IV SOLN
INTRAVENOUS | Status: DC | PRN
  Administered 2017-04-28: 30 mg via INTRAVENOUS

## 2017-04-28 MED ORDER — LACTATED RINGERS IV SOLN
10.0000 mL/h | INTRAVENOUS | Status: DC
  Administered 2017-04-28: 10 mL/h via INTRAVENOUS

## 2017-04-28 SURGICAL SUPPLY — 23 items
CANISTER SUCT 1200ML W/VALVE (MISCELLANEOUS) ×4 IMPLANT
CLIP HMST 235XBRD CATH ROT (MISCELLANEOUS) IMPLANT
CLIP RESOLUTION 360 11X235 (MISCELLANEOUS)
FCP ESCP3.2XJMB 240X2.8X (MISCELLANEOUS)
FORCEPS BIOP RAD 4 LRG CAP 4 (CUTTING FORCEPS) ×4 IMPLANT
FORCEPS BIOP RJ4 240 W/NDL (MISCELLANEOUS)
FORCEPS ESCP3.2XJMB 240X2.8X (MISCELLANEOUS) IMPLANT
GOWN CVR UNV OPN BCK APRN NK (MISCELLANEOUS) ×4 IMPLANT
GOWN ISOL THUMB LOOP REG UNIV (MISCELLANEOUS) ×4
INJECTOR VARIJECT VIN23 (MISCELLANEOUS) IMPLANT
KIT DEFENDO VALVE AND CONN (KITS) IMPLANT
KIT ENDO PROCEDURE OLY (KITS) ×4 IMPLANT
MARKER SPOT ENDO TATTOO 5ML (MISCELLANEOUS) IMPLANT
PAD GROUND ADULT SPLIT (MISCELLANEOUS) IMPLANT
PROBE APC STR FIRE (PROBE) IMPLANT
RETRIEVER NET ROTH 2.5X230 LF (MISCELLANEOUS) IMPLANT
SNARE SHORT THROW 13M SML OVAL (MISCELLANEOUS) ×4 IMPLANT
SNARE SHORT THROW 30M LRG OVAL (MISCELLANEOUS) IMPLANT
SNARE SNG USE RND 15MM (INSTRUMENTS) IMPLANT
SPOT EX ENDOSCOPIC TATTOO (MISCELLANEOUS)
TRAP ETRAP POLY (MISCELLANEOUS) ×4 IMPLANT
VARIJECT INJECTOR VIN23 (MISCELLANEOUS)
WATER STERILE IRR 250ML POUR (IV SOLUTION) ×4 IMPLANT

## 2017-04-28 NOTE — Anesthesia Postprocedure Evaluation (Signed)
Anesthesia Post Note  Patient: Guy Phillips  Procedure(s) Performed: Procedure(s) (LRB): COLONOSCOPY WITH PROPOFOL (N/A) POLYPECTOMY  Patient location during evaluation: PACU Anesthesia Type: General Level of consciousness: awake Pain management: pain level controlled Vital Signs Assessment: post-procedure vital signs reviewed and stable Respiratory status: spontaneous breathing, nonlabored ventilation and respiratory function stable Cardiovascular status: stable Postop Assessment: no signs of nausea or vomiting Anesthetic complications: no    Veda Canning

## 2017-04-28 NOTE — H&P (Signed)
Lucilla Lame, MD [facility] [address], [place], [place] [address] Phone:[phone] Fax : [phone]  Primary Care Physician:  Guy Haven, MD Primary Gastroenterologist:  Dr. Allen Phillips  Pre-Procedure History & Physical: HPI:  Guy Phillips is a 76 y.o. male is here for an colonoscopy.   Past Medical History:  Diagnosis Date  . CAD (coronary artery disease)    s/p MI 2011 Cath showed 100% occlusion of RCA with collaterals open, followed by Dr. Ubaldo Phillips  . Dental bridge present    also caps and implants  . Diabetes mellitus Dx 2005   HgbA1c 6.8%, Optho [facility] 2001  . Hernia    Umbilical  . Hyperlipidemia   . Hypertension   . Kidney stone    2008  . 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 Evorn Phillips  . Stroke ([place]) 05/20/2014   small left hemispheric cortical stroke following CABG.  No deficits  . Uses hearing aid    has.  Doesn't usually wear    Past Surgical History:  Procedure Laterality Date  . CORONARY ARTERY BYPASS GRAFT  2015   x3 vessels, with AVR  . EYE SURGERY     cataract extraction, bilateral  . FUNCTIONAL ENDOSCOPIC SINUS SURGERY     Dr. Fatima Phillips in [place]    Prior to Admission medications   Medication Sig Start Date End Date Taking? Authorizing Provider  acetaminophen (TYLENOL) 325 MG tablet Take by mouth.   Yes [provider]  aspirin 81 MG tablet Take 81 mg by mouth daily.     Yes [provider]  Cholecalciferol (VITAMIN D3) 2000 UNITS TABS Take 1 tablet by mouth daily.     Yes [provider]  fexofenadine (ALLEGRA) 180 MG tablet Take 180 mg by mouth daily.   Yes [provider]  fluticasone (FLONASE) 50 MCG/ACT nasal spray Place into the nose Daily. 11/20/11  Yes [provider]  glipiZIDE (GLUCOTROL) 5 MG tablet TAKE 1 TABLET BY MOUTH TWICE A DAY BEFORE MEALS 10/23/16  Yes Guy Haven, MD  metFORMIN (GLUCOPHAGE) 1000 MG tablet Take 1 tablet (1,000 mg total) by mouth  2 (two) times daily with a meal. 12/06/16  Yes Guy Haven, MD  metoprolol tartrate (LOPRESSOR) 25 MG tablet Take 25 mg by mouth as directed. Take half tablet in the am and half in pm   Yes [provider]  Multiple Vitamin (MULTIVITAMIN) tablet Take 1 tablet by mouth daily.     Yes [provider]  nitroGLYCERIN (NITROSTAT) 0.4 MG SL tablet Place 0.4 mg under the tongue every 5 (five) minutes as needed.   Yes [provider]  rosuvastatin (CRESTOR) 20 MG tablet Take 1 tablet (20 mg total) by mouth daily. 11/27/16  Yes Guy Haven, MD  triamterene-hydrochlorothiazide (DYAZIDE) 37.5-25 MG capsule Take 1 capsule by mouth daily.   Yes [provider]  Blood Glucose Monitoring Suppl (ONE TOUCH ULTRA SYSTEM KIT) w/Device KIT 1 kit by Does not apply route once. 10/30/15   Guy Confer, MD  glucose blood (ONE TOUCH ULTRA TEST) test strip Use as instructed 03/19/17   Guy Haven, MD    Allergies as of 04/08/2017  . (No Known Allergies)    Family History  Problem Relation Age of Onset  . Cancer Father        liver    Social History   Social History  . Marital status: Married  Spouse name: N/A  . Number of children: N/A  . Years of education: N/A   Occupational History  . Not on file.   Social History Main Topics  . Smoking status: Former Smoker    Quit date: 07/03/1991  . Smokeless tobacco: Never Used  . Alcohol use No  . Drug use: No  . Sexual activity: No   Other Topics Concern  . Not on file   Social History Narrative  . No narrative on file    Review of Systems: See HPI, otherwise negative ROS  Physical Exam: BP 115/63   Pulse 63   Temp 97.7 F (36.5 C) (Temporal)   Resp 16   Ht _0  (1.727 m)   Wt 223 lb (101.2 kg)   SpO2 99%   BMI 33.91 kg/m  General:   Alert,  pleasant and cooperative in NAD Head:  Normocephalic and atraumatic. Neck:  Supple; no masses or thyromegaly. Lungs:  Clear throughout to  auscultation.    Heart:  Regular rate and rhythm. Abdomen:  Soft, nontender and nondistended. Normal bowel sounds, without guarding, and without rebound.   Neurologic:  Alert and  oriented x4;  grossly normal neurologically.  Impression/Plan: Guy Phillips is here for an colonoscopy to be performed for history of colon polyps  Risks, benefits, limitations, and alternatives regarding  colonoscopy have been reviewed with the patient.  Questions have been answered.  All parties agreeable.   Lucilla Lame, MD  04/28/2017, 7:55 AM

## 2017-04-28 NOTE — Transfer of Care (Signed)
Immediate Anesthesia Transfer of Care Note  Patient: Guy Phillips  Procedure(s) Performed: Procedure(s) with comments: COLONOSCOPY WITH PROPOFOL (N/A) - Diabetic - oral meds POLYPECTOMY  Patient Location: PACU  Anesthesia Type: General  Level of Consciousness: awake, alert  and patient cooperative  Airway and Oxygen Therapy: Patient Spontanous Breathing and Patient connected to supplemental oxygen  Post-op Assessment: Post-op Vital signs reviewed, Patient's Cardiovascular Status Stable, Respiratory Function Stable, Patent Airway and No signs of Nausea or vomiting  Post-op Vital Signs: Reviewed and stable  Complications: No apparent anesthesia complications

## 2017-04-28 NOTE — Anesthesia Procedure Notes (Signed)
Date/Time: 04/28/2017 9:20 AM Performed by: Cameron Ali Pre-anesthesia Checklist: Patient identified, Emergency Drugs available, Suction available, Timeout performed and Patient being monitored Patient Re-evaluated:Patient Re-evaluated prior to induction Oxygen Delivery Method: Nasal cannula Placement Confirmation: positive ETCO2

## 2017-04-28 NOTE — Op Note (Signed)
[facility] Patient Name: Guy Phillips Procedure Date: 04/28/2017 9:11 AM MRN: [id number] Account #: [id number] Date of Birth: [birth year] Admit Type: Outpatient Age: 76 Room: [facility] OR ROOM 01 Gender: Male Note Status: Finalized Procedure:            Colonoscopy Indications:          High risk colon cancer surveillance: Personal history                        of colonic polyps Providers:            Lucilla Lame MD, MD Referring MD:         Angela Adam. Caryl Bis (Referring MD) Medicines:            Propofol per Anesthesia Complications:        No immediate complications. Procedure:            Pre-Anesthesia Assessment:                       - Prior to the procedure, a History and Physical was                        performed, and patient medications and allergies were                        reviewed. The patient's tolerance of previous                        anesthesia was also reviewed. The risks and benefits of                        the procedure and the sedation options and risks were                        discussed with the patient. All questions were                        answered, and informed consent was obtained. Prior                        Anticoagulants: The patient has taken no previous                        anticoagulant or antiplatelet agents. ASA Grade                        Assessment: II - A patient with mild systemic disease.                        After reviewing the risks and benefits, the patient was                        deemed in satisfactory condition to undergo the                        procedure.                       After obtaining informed consent, the colonoscope was  passed under direct vision. Throughout the procedure,                        the patient's blood pressure, pulse, and oxygen                        saturations were monitored continuously. The [place] [id number]) was introduced through the                        anus and advanced to the the cecum, identified by                        appendiceal orifice and ileocecal valve. The                        colonoscopy was performed without difficulty. The                        patient tolerated the procedure well. The quality of                        the bowel preparation was excellent. Findings:      The perianal and digital rectal examinations were normal.      Non-bleeding internal hemorrhoids were found during retroflexion. The       hemorrhoids were Grade II (internal hemorrhoids that prolapse but reduce       spontaneously).      A 3 mm polyp was found in the ascending colon. The polyp was sessile.       The polyp was removed with a cold biopsy forceps. Resection and       retrieval were complete.      A 3 mm polyp was found in the sigmoid colon. The polyp was sessile. The       polyp was removed with a cold biopsy forceps. Resection and retrieval       were complete.      A 8 mm polyp was found in the rectum. The polyp was pedunculated. The       polyp was removed with a cold snare. Resection and retrieval were       complete.      A few small-mouthed diverticula were found in the entire colon. Impression:           - Non-bleeding internal hemorrhoids.                       - One 3 mm polyp in the ascending colon, removed with a                        cold biopsy forceps. Resected and retrieved.                       - One 3 mm polyp in the sigmoid colon, removed with a                        cold biopsy forceps. Resected and retrieved.                       -  One 8 mm polyp in the rectum, removed with a cold                        snare. Resected and retrieved.                       - Diverticulosis in the entire examined colon. Recommendation:       - Discharge patient to home.                       - Resume previous diet.                       - Continue present  medications.                       - Await pathology results.                       - Repeat colonoscopy in 5 years for surveillance. Procedure Code(s):    --- Professional ---                       [id number], Colonoscopy, flexible; with removal of tumor(s),                        polyp(s), or other lesion(s) by snare technique                       45380, 1, Colonoscopy, flexible; with biopsy, single                        or multiple Diagnosis Code(s):    --- Professional ---                       Z86.010, Personal history of colonic polyps                       D12.2, Benign neoplasm of ascending colon                       D12.5, Benign neoplasm of sigmoid colon                       K62.1, Rectal polyp CPT copyright 2016 [employer]. All rights reserved. The codes documented in this report are preliminary and upon coder review may  be revised to meet current compliance requirements. Lucilla Lame MD, MD 04/28/2017 9:45:01 AM This report has been signed electronically. Number of Addenda: 0 Note Initiated On: 04/28/2017 9:11 AM Scope Withdrawal Time: 0 hours 13 minutes 27 seconds  Total Procedure Duration: 0 hours 17 minutes 39 seconds       [address] [facility]

## 2017-04-29 ENCOUNTER — Encounter: Payer: Self-pay | Admitting: Gastroenterology

## 2017-04-30 ENCOUNTER — Encounter: Payer: Self-pay | Admitting: Gastroenterology

## 2017-05-01 ENCOUNTER — Encounter: Payer: Self-pay | Admitting: Gastroenterology

## 2017-05-20 ENCOUNTER — Encounter: Payer: Self-pay | Admitting: Family Medicine

## 2017-05-20 ENCOUNTER — Ambulatory Visit (INDEPENDENT_AMBULATORY_CARE_PROVIDER_SITE_OTHER): Payer: Medicare Other | Admitting: Family Medicine

## 2017-05-20 DIAGNOSIS — E669 Obesity, unspecified: Secondary | ICD-10-CM | POA: Diagnosis not present

## 2017-05-20 DIAGNOSIS — Z8673 Personal history of transient ischemic attack (TIA), and cerebral infarction without residual deficits: Secondary | ICD-10-CM

## 2017-05-20 DIAGNOSIS — E1169 Type 2 diabetes mellitus with other specified complication: Secondary | ICD-10-CM

## 2017-05-20 DIAGNOSIS — Z23 Encounter for immunization: Secondary | ICD-10-CM | POA: Diagnosis not present

## 2017-05-20 DIAGNOSIS — I2510 Atherosclerotic heart disease of native coronary artery without angina pectoris: Secondary | ICD-10-CM | POA: Diagnosis not present

## 2017-05-20 NOTE — Patient Instructions (Signed)
Nice to see you. Please continue to stay active and monitor your blood pressure and diabetes.

## 2017-05-20 NOTE — Assessment & Plan Note (Signed)
Asymptomatic. Most recent cardiology note reviewed.

## 2017-05-20 NOTE — Progress Notes (Signed)
  Tommi Rumps, MD Phone: [phone]  Guy Phillips is a 76 y.o. male who presents today for follow-up.  Diabetes: Patient notes that sugars are unchanged. He remains on the same regimen. No polyuria or polydipsia. No hypoglycemia.  He has a history of CVA following cardiac bypass surgery and valve replacement. Notes he underwent the surgery and then had difficulty speaking and saw colors that weren't there. He notes he woke up 3 days later without remembering anything. He has done well since then with no recurrent deficits. He currently takes aspirin. Also on statin and diabetes therapy.  CAD: No chest pain or shortness of breath. Followed by cardiology. Note reviewed.  PMH: Former smoker   ROS see history of present illness  Objective  Physical Exam Vitals:   05/20/17 1520  BP: 130/70  Pulse: 62  Temp: 98.6 F (37 C)  SpO2: 95%    BP Readings from Last 3 Encounters:  05/20/17 130/70  04/28/17 121/69  04/25/17 124/70   Wt Readings from Last 3 Encounters:  05/20/17 226 lb (102.5 kg)  04/28/17 223 lb (101.2 kg)  04/25/17 226 lb 1.9 oz (102.6 kg)    Physical Exam  Constitutional: No distress.  HENT:  Head: Normocephalic and atraumatic.  Cardiovascular: Normal rate, regular rhythm and normal heart sounds.   Pulmonary/Chest: Effort normal and breath sounds normal.  Musculoskeletal: He exhibits no edema.  Neurological: He is alert. Gait normal.  Skin: Skin is warm and dry. He is not diaphoretic.     Assessment/Plan: Please see individual problem list.  CAD (coronary artery disease) Asymptomatic. Most recent cardiology note reviewed.  Diabetes mellitus type 2 in obese ([place]) Well-controlled. Continue current medications.  History of CVA (cerebrovascular accident) No recurrent deficits. Continue to monitor. Continue secondary prevention.  Patient is to advise front desk that he should follow-up in 4 months.  Orders Placed This Encounter  Procedures    . Flu vaccine HIGH DOSE PF  . Urine Microalbumin w/creat. ratio   Tommi Rumps, MD [place]

## 2017-05-20 NOTE — Assessment & Plan Note (Signed)
No recurrent deficits. Continue to monitor. Continue secondary prevention.

## 2017-05-20 NOTE — Assessment & Plan Note (Signed)
Well controlled. Continue current medications  

## 2017-05-21 LAB — MICROALBUMIN / CREATININE URINE RATIO
CREATININE, U: 154.6 mg/dL
MICROALB UR: 0.9 mg/dL (ref 0.0–1.9)
Microalb Creat Ratio: 0.6 mg/g (ref 0.0–30.0)

## 2017-08-21 ENCOUNTER — Encounter: Payer: Self-pay | Admitting: Intensive Care

## 2017-08-21 ENCOUNTER — Other Ambulatory Visit: Payer: Self-pay

## 2017-08-21 ENCOUNTER — Inpatient Hospital Stay
Admission: EM | Admit: 2017-08-21 | Discharge: 2017-08-24 | DRG: 418 | Disposition: A | Discharge status: Home / Self Care | Payer: Medicare Other | Attending: Surgery | Admitting: Surgery

## 2017-08-21 ENCOUNTER — Emergency Department: Payer: Medicare Other

## 2017-08-21 ENCOUNTER — Ambulatory Visit: Payer: Self-pay | Admitting: Hematology

## 2017-08-21 DIAGNOSIS — K821 Hydrops of gallbladder: Secondary | ICD-10-CM | POA: Diagnosis not present

## 2017-08-21 DIAGNOSIS — E785 Hyperlipidemia, unspecified: Secondary | ICD-10-CM | POA: Diagnosis present

## 2017-08-21 DIAGNOSIS — E669 Obesity, unspecified: Secondary | ICD-10-CM | POA: Diagnosis present

## 2017-08-21 DIAGNOSIS — I10 Essential (primary) hypertension: Secondary | ICD-10-CM | POA: Diagnosis not present

## 2017-08-21 DIAGNOSIS — R1011 Right upper quadrant pain: Secondary | ICD-10-CM

## 2017-08-21 DIAGNOSIS — I447 Left bundle-branch block, unspecified: Secondary | ICD-10-CM | POA: Diagnosis present

## 2017-08-21 DIAGNOSIS — K5730 Diverticulosis of large intestine without perforation or abscess without bleeding: Secondary | ICD-10-CM | POA: Diagnosis not present

## 2017-08-21 DIAGNOSIS — Z6833 Body mass index (BMI) 33.0-33.9, adult: Secondary | ICD-10-CM

## 2017-08-21 DIAGNOSIS — K8012 Calculus of gallbladder with acute and chronic cholecystitis without obstruction: Secondary | ICD-10-CM | POA: Diagnosis not present

## 2017-08-21 DIAGNOSIS — Z951 Presence of aortocoronary bypass graft: Secondary | ICD-10-CM

## 2017-08-21 DIAGNOSIS — K82A1 Gangrene of gallbladder in cholecystitis: Secondary | ICD-10-CM | POA: Diagnosis present

## 2017-08-21 DIAGNOSIS — G4730 Sleep apnea, unspecified: Secondary | ICD-10-CM | POA: Diagnosis present

## 2017-08-21 DIAGNOSIS — K810 Acute cholecystitis: Secondary | ICD-10-CM | POA: Diagnosis present

## 2017-08-21 DIAGNOSIS — Z7982 Long term (current) use of aspirin: Secondary | ICD-10-CM

## 2017-08-21 DIAGNOSIS — K811 Chronic cholecystitis: Secondary | ICD-10-CM

## 2017-08-21 DIAGNOSIS — Z953 Presence of xenogenic heart valve: Secondary | ICD-10-CM

## 2017-08-21 DIAGNOSIS — Z8673 Personal history of transient ischemic attack (TIA), and cerebral infarction without residual deficits: Secondary | ICD-10-CM

## 2017-08-21 DIAGNOSIS — Z7951 Long term (current) use of inhaled steroids: Secondary | ICD-10-CM

## 2017-08-21 DIAGNOSIS — E860 Dehydration: Secondary | ICD-10-CM | POA: Diagnosis present

## 2017-08-21 DIAGNOSIS — I252 Old myocardial infarction: Secondary | ICD-10-CM

## 2017-08-21 DIAGNOSIS — I2510 Atherosclerotic heart disease of native coronary artery without angina pectoris: Secondary | ICD-10-CM | POA: Diagnosis not present

## 2017-08-21 DIAGNOSIS — Z87891 Personal history of nicotine dependence: Secondary | ICD-10-CM

## 2017-08-21 DIAGNOSIS — Z85828 Personal history of other malignant neoplasm of skin: Secondary | ICD-10-CM

## 2017-08-21 DIAGNOSIS — Z9841 Cataract extraction status, right eye: Secondary | ICD-10-CM

## 2017-08-21 DIAGNOSIS — Z419 Encounter for procedure for purposes other than remedying health state, unspecified: Secondary | ICD-10-CM

## 2017-08-21 DIAGNOSIS — Z87442 Personal history of urinary calculi: Secondary | ICD-10-CM

## 2017-08-21 DIAGNOSIS — Z86010 Personal history of colonic polyps: Secondary | ICD-10-CM

## 2017-08-21 DIAGNOSIS — K4020 Bilateral inguinal hernia, without obstruction or gangrene, not specified as recurrent: Secondary | ICD-10-CM | POA: Diagnosis not present

## 2017-08-21 DIAGNOSIS — Z91018 Allergy to other foods: Secondary | ICD-10-CM

## 2017-08-21 DIAGNOSIS — Z800 Family history of malignant neoplasm of digestive organs: Secondary | ICD-10-CM

## 2017-08-21 DIAGNOSIS — K8064 Calculus of gallbladder and bile duct with chronic cholecystitis without obstruction: Secondary | ICD-10-CM | POA: Diagnosis present

## 2017-08-21 DIAGNOSIS — I440 Atrioventricular block, first degree: Secondary | ICD-10-CM | POA: Diagnosis present

## 2017-08-21 DIAGNOSIS — E119 Type 2 diabetes mellitus without complications: Secondary | ICD-10-CM | POA: Diagnosis present

## 2017-08-21 DIAGNOSIS — E876 Hypokalemia: Secondary | ICD-10-CM | POA: Diagnosis not present

## 2017-08-21 DIAGNOSIS — Z7984 Long term (current) use of oral hypoglycemic drugs: Secondary | ICD-10-CM

## 2017-08-21 DIAGNOSIS — K829 Disease of gallbladder, unspecified: Secondary | ICD-10-CM | POA: Diagnosis not present

## 2017-08-21 DIAGNOSIS — Z9842 Cataract extraction status, left eye: Secondary | ICD-10-CM

## 2017-08-21 HISTORY — DX: Acute cholecystitis: K81.0

## 2017-08-21 LAB — URINALYSIS, COMPLETE (UACMP) WITH MICROSCOPIC
BACTERIA UA: NONE SEEN
Bilirubin Urine: NEGATIVE
Glucose, UA: NEGATIVE mg/dL
HGB URINE DIPSTICK: NEGATIVE
Ketones, ur: NEGATIVE mg/dL
Leukocytes, UA: NEGATIVE
Nitrite: NEGATIVE
PROTEIN: 30 mg/dL — AB
Specific Gravity, Urine: 1.021 (ref 1.005–1.030)
pH: 5 (ref 5.0–8.0)

## 2017-08-21 LAB — LIPASE, BLOOD: LIPASE: 28 U/L (ref 11–51)

## 2017-08-21 LAB — COMPREHENSIVE METABOLIC PANEL
ALBUMIN: 4.2 g/dL (ref 3.5–5.0)
ALT: 16 U/L — ABNORMAL LOW (ref 17–63)
AST: 21 U/L (ref 15–41)
Alkaline Phosphatase: 70 U/L (ref 38–126)
Anion gap: 14 (ref 5–15)
BUN: 21 mg/dL — AB (ref 6–20)
CHLORIDE: 97 mmol/L — AB (ref 101–111)
CO2: 25 mmol/L (ref 22–32)
Calcium: 9.4 mg/dL (ref 8.9–10.3)
Creatinine, Ser: 1.26 mg/dL — ABNORMAL HIGH (ref 0.61–1.24)
GFR calc Af Amer: >60 mL/min (ref >60)
GFR calc non Af Amer: 54 mL/min — ABNORMAL LOW (ref >60)
GLUCOSE: 157 mg/dL — AB (ref 65–99)
POTASSIUM: 3.7 mmol/L (ref 3.5–5.1)
Sodium: 136 mmol/L (ref 135–145)
Total Bilirubin: 1.9 mg/dL — ABNORMAL HIGH (ref 0.3–1.2)
Total Protein: 7.7 g/dL (ref 6.5–8.1)

## 2017-08-21 LAB — CBC
HEMATOCRIT: 45.3 % (ref 40.0–52.0)
Hemoglobin: 15.3 g/dL (ref 13.0–18.0)
MCH: 31.3 pg (ref 26.0–34.0)
MCHC: 33.8 g/dL (ref 32.0–36.0)
MCV: 92.6 fL (ref 80.0–100.0)
Platelets: 212 10*3/uL (ref 150–440)
RBC: 4.89 MIL/uL (ref 4.40–5.90)
RDW: 13.3 % (ref 11.5–14.5)
WBC: 11.6 10*3/uL — ABNORMAL HIGH (ref 3.8–10.6)

## 2017-08-21 LAB — TROPONIN I
TROPONIN I: 0.03 ng/mL — AB (ref <0.03)
Troponin I: <0.03 ng/mL (ref <0.03)

## 2017-08-21 LAB — GLUCOSE, CAPILLARY: Glucose-Capillary: 142 mg/dL — ABNORMAL HIGH (ref 65–99)

## 2017-08-21 MED ORDER — INSULIN ASPART 100 UNIT/ML ~~LOC~~ SOLN
0.0000 [IU] | SUBCUTANEOUS | Status: DC
Start: 2017-08-21 — End: 2017-08-24
  Administered 2017-08-22 (×3): 3 [IU] via SUBCUTANEOUS
  Administered 2017-08-22: 2 [IU] via SUBCUTANEOUS
  Administered 2017-08-22 (×2): 3 [IU] via SUBCUTANEOUS
  Administered 2017-08-23: 2 [IU] via SUBCUTANEOUS
  Administered 2017-08-23 (×2): 5 [IU] via SUBCUTANEOUS
  Administered 2017-08-23 (×2): 2 [IU] via SUBCUTANEOUS
  Administered 2017-08-23 – 2017-08-24 (×2): 5 [IU] via SUBCUTANEOUS
  Administered 2017-08-24: 8 [IU] via SUBCUTANEOUS
  Filled 2017-08-21 (×14): qty 1

## 2017-08-21 MED ORDER — MORPHINE SULFATE (PF) 2 MG/ML IV SOLN
INTRAVENOUS | Status: AC
  Filled 2017-08-21: qty 2

## 2017-08-21 MED ORDER — KETOROLAC TROMETHAMINE 30 MG/ML IJ SOLN: 15.0000 mg | Freq: Four times a day (QID) | INTRAMUSCULAR | Status: DC | PRN

## 2017-08-21 MED ORDER — LACTATED RINGERS IV SOLN
125.0000 mL/h | INTRAVENOUS | Status: DC
  Administered 2017-08-21 – 2017-08-23 (×5): 125 mL/h via INTRAVENOUS

## 2017-08-21 MED ORDER — ENOXAPARIN SODIUM 40 MG/0.4ML ~~LOC~~ SOLN
40.0000 mg | SUBCUTANEOUS | Status: DC
  Administered 2017-08-21 – 2017-08-22 (×2): 40 mg via SUBCUTANEOUS
  Filled 2017-08-21 (×2): qty 0.4

## 2017-08-21 MED ORDER — PIPERACILLIN-TAZOBACTAM 3.375 G IVPB 30 MIN: 3.3750 g | Freq: Once | INTRAVENOUS | Status: DC

## 2017-08-21 MED ORDER — ACETAMINOPHEN 500 MG PO TABS: 1000.0000 mg | ORAL_TABLET | Freq: Four times a day (QID) | ORAL | Status: DC | PRN

## 2017-08-21 MED ORDER — MORPHINE BOLUS VIA INFUSION
4.0000 mg | Freq: Once | INTRAVENOUS | Status: AC
  Administered 2017-08-21: 4 mg via INTRAVENOUS
  Filled 2017-08-21: qty 4

## 2017-08-21 MED ORDER — FESOTERODINE FUMARATE ER 8 MG PO TB24
8.0000 mg | ORAL_TABLET | Freq: Every day | ORAL | Status: DC
  Filled 2017-08-21 (×3): qty 1

## 2017-08-21 MED ORDER — ONDANSETRON HCL 4 MG/2ML IJ SOLN: 4.0000 mg | Freq: Four times a day (QID) | INTRAMUSCULAR | Status: DC | PRN

## 2017-08-21 MED ORDER — LORATADINE 10 MG PO TABS
10.0000 mg | ORAL_TABLET | Freq: Every day | ORAL | Status: DC
  Administered 2017-08-22 – 2017-08-24 (×2): 10 mg via ORAL
  Filled 2017-08-21 (×2): qty 1

## 2017-08-21 MED ORDER — ONDANSETRON 4 MG PO TBDP: 4.0000 mg | ORAL_TABLET | Freq: Four times a day (QID) | ORAL | Status: DC | PRN

## 2017-08-21 MED ORDER — PIPERACILLIN-TAZOBACTAM 3.375 G IVPB
3.3750 g | Freq: Three times a day (TID) | INTRAVENOUS | Status: DC
  Administered 2017-08-21 – 2017-08-24 (×8): 3.375 g via INTRAVENOUS
  Filled 2017-08-21 (×8): qty 50

## 2017-08-21 MED ORDER — TRIAMTERENE-HCTZ 37.5-25 MG PO CAPS
1.0000 | ORAL_CAPSULE | Freq: Every day | ORAL | Status: DC
  Administered 2017-08-22 – 2017-08-24 (×2): 1 via ORAL
  Filled 2017-08-21 (×3): qty 1

## 2017-08-21 MED ORDER — METOPROLOL TARTRATE 25 MG PO TABS
12.5000 mg | ORAL_TABLET | Freq: Every day | ORAL | Status: DC
  Administered 2017-08-22: 12.5 mg via ORAL
  Filled 2017-08-21: qty 1

## 2017-08-21 MED ORDER — PANTOPRAZOLE SODIUM 40 MG IV SOLR
40.0000 mg | Freq: Every day | INTRAVENOUS | Status: DC
  Administered 2017-08-21 – 2017-08-23 (×3): 40 mg via INTRAVENOUS
  Filled 2017-08-21 (×3): qty 40

## 2017-08-21 MED ORDER — HYDROMORPHONE HCL 1 MG/ML IJ SOLN: 0.5000 mg | INTRAMUSCULAR | Status: DC | PRN

## 2017-08-21 MED ORDER — POLYETHYLENE GLYCOL 3350 17 G PO PACK: 17.0000 g | PACK | Freq: Every day | ORAL | Status: DC | PRN

## 2017-08-21 MED ORDER — TECHNETIUM TC 99M MEBROFENIN IV KIT
2.0000 | PACK | Freq: Once | INTRAVENOUS | Status: AC | PRN
  Administered 2017-08-21: 2.203 via INTRAVENOUS

## 2017-08-21 MED ORDER — TECHNETIUM TC 99M MEBROFENIN IV KIT
5.5200 | PACK | Freq: Once | INTRAVENOUS | Status: AC | PRN
  Administered 2017-08-21: 5.52 via INTRAVENOUS

## 2017-08-21 MED ORDER — FLUTICASONE PROPIONATE 50 MCG/ACT NA SUSP
1.0000 | Freq: Every day | NASAL | Status: DC
  Administered 2017-08-22: 1 via NASAL
  Filled 2017-08-21: qty 16

## 2017-08-21 NOTE — ED Provider Notes (Signed)
[facility] Emergency Department Provider Note  Time seen: 12:30 PM  I have reviewed the triage vital signs and the nursing notes.   HISTORY  Chief Complaint Abdominal Pain (RUQ)    HPI Guy Phillips is a 76 y.o. male with a past medical history of CAD, MI, diabetes, hypertension, hyperlipidemia, presents to the emergency department for right upper quadrant abdominal pain.  According to the patient for the past 1 week he has been experiencing intermittent right upper quadrant abdominal pain more constant over the past 2-3 days.  States occasional nausea and decreased appetite but denies vomiting or diarrhea.  Denies chest pain or shortness of breath, but states the pain in his right mid to upper abdomen is worse with deep inspiration, but also worse with movement or if he presses on the area.  The patient's only past abdominal surgery as a hernia repair.  Patient states possible subjective fever 1 week ago but that has since resolved.   Past Medical History:  Diagnosis Date  . CAD (coronary artery disease)    s/p MI 2011 Cath showed 100% occlusion of RCA with collaterals open, followed by Dr. Ubaldo Glassing  . Dental bridge present    also caps and implants  . Diabetes mellitus Dx 2005   HgbA1c 6.8%, Optho [facility] 2001  . Hernia    Umbilical  . Hyperlipidemia   . Hypertension   . Kidney stone    2008  . 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 Evorn Gong  . Stroke ([place]) 05/20/2014   small left hemispheric cortical stroke following CABG.  No deficits  . Uses hearing aid    has.  Doesn't usually wear    Patient Active Problem List   Diagnosis Date Noted  . Personal history of colonic polyps   . Benign neoplasm of ascending colon   . Polyp of sigmoid colon   . Rectal polyp   . Low back pain 03/19/2017  . Viral upper respiratory infection 09/02/2016  . Neuropathy, diabetic ([place]) 10/23/2015  . History of CVA (cerebrovascular accident)  06/29/2014  . H/O aortic valve replacement 06/29/2014  . H/O coronary artery bypass surgery 05/19/2014  . Obesity (BMI 30-39.9) 05/21/2013  . Medicare annual wellness visit, subsequent 07/29/2012  . Urge incontinence 01/13/2012  . CAD (coronary artery disease) 01/13/2012  . Diabetes mellitus type 2 in obese ([place]) 07/03/2011  . Hypertension 07/03/2011  . Hyperlipidemia 07/03/2011    Past Surgical History:  Procedure Laterality Date  . COLONOSCOPY WITH PROPOFOL N/A 04/28/2017   Procedure: COLONOSCOPY WITH PROPOFOL;  Surgeon: Lucilla Lame, MD;  Location: [place];  Service: Gastroenterology;  Laterality: N/A;  Diabetic - oral meds  . CORONARY ARTERY BYPASS GRAFT  2015   x3 vessels, with AVR  . EYE SURGERY     cataract extraction, bilateral  . FUNCTIONAL ENDOSCOPIC SINUS SURGERY     Dr. Fatima Sanger in [place]  . POLYPECTOMY  04/28/2017   Procedure: POLYPECTOMY;  Surgeon: Lucilla Lame, MD;  Location: [place];  Service: Gastroenterology;;    Prior to Admission medications   Medication Sig Start Date End Date Taking? Authorizing Provider  aspirin 81 MG tablet Take 81 mg by mouth daily.     Yes [provider]  Cholecalciferol (VITAMIN D3) 2000 UNITS TABS Take 1 tablet by mouth daily.     Yes [provider]  fesoterodine (TOVIAZ) 8 MG TB24 tablet Take 8 mg by mouth daily.  Yes [provider]  fexofenadine (ALLEGRA) 180 MG tablet Take 180 mg by mouth daily.   Yes [provider]  fluticasone (FLONASE) 50 MCG/ACT nasal spray Place into the nose Daily. 11/20/11  Yes [provider]  glipiZIDE (GLUCOTROL) 5 MG tablet TAKE 1 TABLET BY MOUTH TWICE A DAY BEFORE MEALS 10/23/16  Yes Leone Haven, MD  metFORMIN (GLUCOPHAGE) 1000 MG tablet Take 1 tablet (1,000 mg total) by mouth 2 (two) times daily with a meal. 12/06/16  Yes Leone Haven, MD  metoprolol tartrate (LOPRESSOR) 25 MG tablet Take 12.5 mg by mouth daily. Take half tablet in  the am and half in pm    Yes [provider]  Multiple Vitamin (MULTIVITAMIN) tablet Take 1 tablet by mouth daily.     Yes [provider]  rosuvastatin (CRESTOR) 20 MG tablet Take 1 tablet (20 mg total) by mouth daily. 11/27/16  Yes Leone Haven, MD  triamterene-hydrochlorothiazide (DYAZIDE) 37.5-25 MG capsule Take 1 capsule by mouth daily.   Yes [provider]  acetaminophen (TYLENOL) 325 MG tablet Take by mouth.    [provider]  Blood Glucose Monitoring Suppl (ONE TOUCH ULTRA SYSTEM KIT) w/Device KIT 1 kit by Does not apply route once. 10/30/15   Jackolyn Confer, MD  glucose blood (ONE TOUCH ULTRA TEST) test strip Use as instructed 03/19/17   Leone Haven, MD  nitroGLYCERIN (NITROSTAT) 0.4 MG SL tablet Place 0.4 mg under the tongue every 5 (five) minutes as needed.    [provider]    Allergies  Allergen Reactions  . Grapefruit Extract Other (See Comments)    Mouth sores  . Other Other (See Comments)    Family History  Problem Relation Age of Onset  . Cancer Father        liver    Social History Social History   Tobacco Use  . Smoking status: Former Smoker    Last attempt to quit: 07/03/1991    Years since quitting: 26.1  . Smokeless tobacco: Never Used  Substance Use Topics  . Alcohol use: Yes    Comment: 1 glass of wine occ  . Drug use: No    Review of Systems Constitutional: Subjective fever 1 week ago, now gone Cardiovascular: Negative for chest pain. Respiratory: Negative for shortness of breath. Gastrointestinal: Right upper quadrant abdominal pain.  Positive for nausea.  Negative for vomiting or diarrhea Genitourinary: Negative for dysuria. Neurological: Negative for headache All other ROS negative  ____________________________________________   PHYSICAL EXAM:  VITAL SIGNS: ED Triage Vitals [08/21/17 1046]  Enc Vitals Group     BP 128/66     Pulse Rate 88     Resp 18     Temp 98.2 F (36.8  C)     Temp Source Oral     SpO2 95 %     Weight 220 lb (99.8 kg)     Height '5\' 8"'$  (1.727 m)     Head Circumference      Peak Flow      Pain Score 5     Pain Loc      Pain Edu?      Excl. in [place]?    Constitutional: Alert and oriented. Well appearing and in no distress. Eyes: Normal exam ENT   Head: Normocephalic and atraumatic.   Mouth/Throat: Mucous membranes are moist. Cardiovascular: Normal rate, regular rhythm. No murmur Respiratory: Normal respiratory effort without tachypnea nor retractions. Breath sounds are clear  Gastrointestinal: Soft, moderate right upper quadrant tenderness to palpation.  No rebound or guarding.  No distention.  No right lower quadrant tenderness. Musculoskeletal: Nontender with normal range of motion in all extremities.  Neurologic:  Normal speech and language. No gross focal neurologic deficits Skin:  Skin is warm, dry and intact.  Psychiatric: Mood and affect are normal.  ____________________________________________    EKG  EKG reviewed and interpreted by myself shows a normal sinus rhythm 89 bpm with a widened QRS, left axis deviation, prolonged PR interval consistent with first-degree AV block, nonspecific ST changes without ST elevation.  Patient's last EKG is from 2012 but does show signs of a left bundle branch block in 2012.  ____________________________________________    RADIOLOGY  Ultrasound shows a thickened gallbladder wall but no stones.  CT scan shows thickened gallbladder wall with possible pericholecystic fluid otherwise largely negative  ____________________________________________   INITIAL IMPRESSION / ASSESSMENT AND PLAN / ED COURSE  Pertinent labs & imaging results that were available during my care of the patient were reviewed by me and considered in my medical decision making (see chart for details).  Patient presents to the emergency department for right upper quadrant abdominal pain ongoing for the past 1  week.  Differential would include cholecystitis, biliary disease, hepatic steatosis, less likely ACS/pneumonia/PE.  Patient does have moderate right mid abdominal tenderness to right upper quadrant tenderness.  Will obtain an ultrasound to further evaluate.  Patient's labs show a slight leukocytosis 11,600, largely normal LFTs besides a slight bilirubinemia of 1.9.  Patient agreeable to this plan of care.  Does not wish for anything for pain at this time.  Ultrasound shows a thickened gallbladder wall to 6 mm but no stones otherwise normal.  Given the patient's continued right upper quadrant pain we will obtain a CT scan to further evaluate and rule out ureterolithiasis among other intra-abdominal pathology.  Patient CT scan shows a thickened gallbladder wall with possible pericholecystic infiltration/fluid but no gallstones, possibly representing acute acalculous cholecystitis.  I discussed this with Dr. Rosana Hoes of general surgery, he recommends obtaining a HIDA scan to further evaluate.  I discussed this with the patient who is agreeable to this plan of care.  HIDA scan pending.  Patient care signed out to Dr. Cinda Quest.  I have reviewed the patient's records, including recent PCP visit as well as colonoscopy.  No significant findings contributory to today's ER visit.  ____________________________________________   FINAL CLINICAL IMPRESSION(S) / ED DIAGNOSES  Right upper quadrant abdominal pain    Harvest Dark, MD 08/21/17 1507

## 2017-08-21 NOTE — H&P (Signed)
Date of Admission:  08/21/2017  Reason for Admission:  Acute cholecystitis  History of Present Illness: Guy Phillips is a 76 y.o. male who presents with a two day history of constant worsening right upper quadrant abdominal pain.  He reports that he had a two day episode of pain from 11/24-25, which he thought was gas or indigestion related.  He had mild pain on 11/27 which was short lived, and then woke up on 11/28 with significant pain.  His pain would worsen with oral intake and when taking deep breaths.  Denies any fevers or chills, chest pain or shortness of breath, nausea or vomiting.  Denies any diarrhea or constipation.  Reports having low back pain which is chronic in nature.  He presented to the ED for evaluation.  Ultrasound did not show any stones or sludge, but did have some gallbladder wall thickening.  CT scan showed wall thickening as well as pericholecystic fluid.  HIDA scan was positive.  Denies any other episodes of pain like this.  Has been stable from cardiac standpoint and had follow up with cardiology in June.  Past Medical History: Past Medical History:  Diagnosis Date  . CAD (coronary artery disease)    s/p MI 2011 Cath showed 100% occlusion of RCA with collaterals open, followed by Dr. Ubaldo Glassing  . Dental bridge present    also caps and implants  . Diabetes mellitus Dx 2005   HgbA1c 6.8%, Optho [facility] 2001  . Hernia    Umbilical  . Hyperlipidemia   . Hypertension   . Kidney stone    2008  . 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 Evorn Gong  . Stroke ([place]) 05/20/2014   small left hemispheric cortical stroke following CABG.  No deficits  . Uses hearing aid    has.  Doesn't usually wear     Past Surgical History: Past Surgical History:  Procedure Laterality Date  . COLONOSCOPY WITH PROPOFOL N/A 04/28/2017   Procedure: COLONOSCOPY WITH PROPOFOL;  Surgeon: Lucilla Lame, MD;  Location: [place];  Service: Gastroenterology;   Laterality: N/A;  Diabetic - oral meds  . CORONARY ARTERY BYPASS GRAFT  2015   x3 vessels, with AVR  . EYE SURGERY     cataract extraction, bilateral  . FUNCTIONAL ENDOSCOPIC SINUS SURGERY     Dr. Fatima Sanger in [place]  . POLYPECTOMY  04/28/2017   Procedure: POLYPECTOMY;  Surgeon: Lucilla Lame, MD;  Location: [place];  Service: Gastroenterology;;    Home Medications: Prior to Admission medications   Medication Sig Start Date End Date Taking? Authorizing Provider  aspirin 81 MG tablet Take 81 mg by mouth daily.     Yes [provider]  Cholecalciferol (VITAMIN D3) 2000 UNITS TABS Take 1 tablet by mouth daily.     Yes [provider]  fesoterodine (TOVIAZ) 8 MG TB24 tablet Take 8 mg by mouth daily.   Yes [provider]  fexofenadine (ALLEGRA) 180 MG tablet Take 180 mg by mouth daily.   Yes [provider]  fluticasone (FLONASE) 50 MCG/ACT nasal spray Place into the nose Daily. 11/20/11  Yes [provider]  glipiZIDE (GLUCOTROL) 5 MG tablet TAKE 1 TABLET BY MOUTH TWICE A DAY BEFORE MEALS 10/23/16  Yes Leone Haven, MD  metFORMIN (GLUCOPHAGE) 1000 MG tablet Take 1 tablet (1,000 mg total) by mouth 2 (two) times daily with a meal. 12/06/16  Yes Leone Haven, MD  metoprolol  tartrate (LOPRESSOR) 25 MG tablet Take 12.5 mg by mouth daily. Take half tablet in the am and half in pm    Yes [provider]  Multiple Vitamin (MULTIVITAMIN) tablet Take 1 tablet by mouth daily.     Yes [provider]  rosuvastatin (CRESTOR) 20 MG tablet Take 1 tablet (20 mg total) by mouth daily. 11/27/16  Yes Leone Haven, MD  triamterene-hydrochlorothiazide (DYAZIDE) 37.5-25 MG capsule Take 1 capsule by mouth daily.   Yes [provider]  acetaminophen (TYLENOL) 325 MG tablet Take by mouth.    [provider]  Blood Glucose Monitoring Suppl (ONE TOUCH ULTRA SYSTEM KIT) w/Device KIT 1 kit by Does not apply route once. 10/30/15    Jackolyn Confer, MD  glucose blood (ONE TOUCH ULTRA TEST) test strip Use as instructed 03/19/17   Leone Haven, MD  nitroGLYCERIN (NITROSTAT) 0.4 MG SL tablet Place 0.4 mg under the tongue every 5 (five) minutes as needed.    [provider]    Allergies: Allergies  Allergen Reactions  . Grapefruit Extract Other (See Comments)    Mouth sores  . Other Other (See Comments)    Social History:  reports that he quit smoking about 26 years ago. he has never used smokeless tobacco. He reports that he drinks alcohol. He reports that he does not use drugs.   Family History: Family History  Problem Relation Age of Onset  . Cancer Father        liver    Review of Systems: Review of Systems  Constitutional: Negative for chills and fever.  HENT: Negative for hearing loss.   Eyes: Negative for blurred vision.  Respiratory: Negative for shortness of breath.   Cardiovascular: Negative for chest pain.  Gastrointestinal: Positive for abdominal pain. Negative for constipation, diarrhea, nausea and vomiting.  Genitourinary: Negative for dysuria.  Musculoskeletal: Positive for back pain. Negative for myalgias.  Skin: Negative for rash.  Neurological: Negative for dizziness.  Psychiatric/Behavioral: Negative for depression.  All other systems reviewed and are negative.   Physical Exam BP 109/62 (BP Location: Left Arm)   Pulse 96   Temp 99.5 F (37.5 C) (Oral)   Resp 16   Ht _0  (1.727 m)   Wt 98.5 kg (217 lb 1.6 oz)   SpO2 97%   BMI 33.01 kg/m  CONSTITUTIONAL: No acute distress HEENT:  Normocephalic, atraumatic, extraocular motion intact. NECK: Trachea is midline, and there is no jugular venous distension. RESPIRATORY:  Lungs are clear, and breath sounds are equal bilaterally. Normal respiratory effort without pathologic use of accessory muscles. CARDIOVASCULAR: Heart is regular without murmurs, gallops, or rubs. GI: The abdomen is soft, nondistended, with  tenderness to palpation in right upper quadrant, with positive Murphy's sign. Has umbilical scar well healed from umbilical hernia repair with mesh, and epigastric scars from chest tubes after CABG/AVR. MUSCULOSKELETAL:  Normal muscle strength and tone in all four extremities.  No peripheral edema or cyanosis. SKIN: Skin turgor is normal. There are no pathologic skin lesions.  NEUROLOGIC:  Motor and sensation is grossly normal.  Cranial nerves are grossly intact. PSYCH:  Alert and oriented to person, place and time. Affect is normal.  Laboratory Analysis: Results for orders placed or performed during the hospital encounter of 08/21/17 (from the past 24 hour(s))  Lipase, blood     Status: None   Collection Time: 08/21/17 10:48 AM  Result Value Ref Range   Lipase 28 11 - 51 U/L  Comprehensive  metabolic panel     Status: Abnormal   Collection Time: 08/21/17 10:48 AM  Result Value Ref Range   Sodium 136 135 - 145 mmol/L   Potassium 3.7 3.5 - 5.1 mmol/L   Chloride 97 (L) 101 - 111 mmol/L   CO2 25 22 - 32 mmol/L   Glucose, Bld 157 (H) 65 - 99 mg/dL   BUN 21 (H) 6 - 20 mg/dL   Creatinine, Ser 1.26 (H) 0.61 - 1.24 mg/dL   Calcium 9.4 8.9 - 10.3 mg/dL   Total Protein 7.7 6.5 - 8.1 g/dL   Albumin 4.2 3.5 - 5.0 g/dL   AST 21 15 - 41 U/L   ALT 16 (L) 17 - 63 U/L   Alkaline Phosphatase 70 38 - 126 U/L   Total Bilirubin 1.9 (H) 0.3 - 1.2 mg/dL   GFR calc non Af Amer 54 (L) >60 mL/min   GFR calc Af Amer >60 >60 mL/min   Anion gap 14 5 - 15  CBC     Status: Abnormal   Collection Time: 08/21/17 10:48 AM  Result Value Ref Range   WBC 11.6 (H) 3.8 - 10.6 K/uL   RBC 4.89 4.40 - 5.90 MIL/uL   Hemoglobin 15.3 13.0 - 18.0 g/dL   HCT 45.3 40.0 - 52.0 %   MCV 92.6 80.0 - 100.0 fL   MCH 31.3 26.0 - 34.0 pg   MCHC 33.8 32.0 - 36.0 g/dL   RDW 13.3 11.5 - 14.5 %   Platelets 212 150 - 440 K/uL  Troponin I     Status: Abnormal   Collection Time: 08/21/17 10:48 AM  Result Value Ref Range   Troponin I  0.03 (HH) <0.03 ng/mL  Urinalysis, Complete w Microscopic     Status: Abnormal   Collection Time: 08/21/17 10:49 AM  Result Value Ref Range   Color, Urine YELLOW (A) YELLOW   APPearance CLEAR (A) CLEAR   Specific Gravity, Urine 1.021 1.005 - 1.030   pH 5.0 5.0 - 8.0   Glucose, UA NEGATIVE NEGATIVE mg/dL   Hgb urine dipstick NEGATIVE NEGATIVE   Bilirubin Urine NEGATIVE NEGATIVE   Ketones, ur NEGATIVE NEGATIVE mg/dL   Protein, ur 30 (A) NEGATIVE mg/dL   Nitrite NEGATIVE NEGATIVE   Leukocytes, UA NEGATIVE NEGATIVE   RBC / HPF 0-5 0 - 5 RBC/hpf   WBC, UA 0-5 0 - 5 WBC/hpf   Bacteria, UA NONE SEEN NONE SEEN   Squamous Epithelial / LPF 0-5 (A) NONE SEEN   Mucus PRESENT    Hyaline Casts, UA PRESENT   Troponin I     Status: None   Collection Time: 08/21/17  6:14 PM  Result Value Ref Range   Troponin I <0.03 <0.03 ng/mL  Glucose, capillary     Status: Abnormal   Collection Time: 08/21/17  9:15 PM  Result Value Ref Range   Glucose-Capillary 142 (H) 65 - 99 mg/dL    Imaging: Nm Hepatobiliary Liver Func  Result Date: 08/21/2017 CLINICAL DATA:  Right upper quadrant pain EXAM: NUCLEAR MEDICINE HEPATOBILIARY IMAGING TECHNIQUE: Sequential images of the abdomen were obtained out to 60 minutes following intravenous administration of radiopharmaceutical. 4 mg morphine administered at approximately 2 hours. Patient reinjected with 2 millicurie of technetium 92 M Choletec. RADIOPHARMACEUTICALS:  Total dose of 7.52 mCi Tc-43m Choletec IV COMPARISON:  CT 08/21/2017, ultrasound 08/21/2017 FINDINGS: Prompt uptake and biliary excretion of activity by the liver is seen. Gallbladder is non visualized at 1 hour. Following morphine injection,  gallbladder is non identified. Normal biliary to bowel transit time. IMPRESSION: Nonvisualization of the gallbladder despite morphine augmentation. Findings would be concerning for acute acalculous cholecystitis. Electronically Signed   By: Donavan Foil M.D.   On:  08/21/2017 18:24   Ct Renal Stone Study  Result Date: 08/21/2017 CLINICAL DATA:  RIGHT-side abdominal pain and pressure since Saturday, worsening with taking a deep breath, history of kidney stones, diabetes mellitus, hypertension, coronary artery disease EXAM: CT ABDOMEN AND PELVIS WITHOUT CONTRAST TECHNIQUE: Multidetector CT imaging of the abdomen and pelvis was performed following the standard protocol without IV contrast. COMPARISON:  None FINDINGS: Lower chest: Lung bases clear Hepatobiliary: Liver unremarkable. Mild gallbladder wall thickening with hazy pericolic infiltration high suspicious for acute cholecystitis. No definite calcified gallstones are seen. Small lymph node adjacent to the lower gallbladder segment 8 mm short axis image 29. Pancreas: Normal appearance Spleen: Normal appearance. Small splenule anterior inferior to spleen Adrenals/Urinary Tract: Adrenal glands normal appearance. Kidneys, ureters, and bladder normal appearance Stomach/Bowel: Normal appendix. Sigmoid diverticulosis without evidence of diverticulitis. Stomach decompressed with suboptimal assessment of gastric wall thickness. Small bowel loops unremarkable. Vascular/Lymphatic: Atherosclerotic calcifications aorta and iliac arteries extending into femoral arteries. Aorta normal caliber without aneurysm. Coronary arterial calcifications and calcifications at the aortic root noted. Mitral annular and question aortic valvular calcifications. Reproductive: Prostatic enlargement, gland 6.4 x 4.0 cm image 88. Other: Small BILATERAL inguinal hernias containing fat. No free air or free fluid. Musculoskeletal: Bones demineralized. IMPRESSION: Gallbladder wall thickening with pericholecystic infiltration suspicious for acute cholecystitis. Sigmoid diverticulosis without evidence of diverticulitis. BILATERAL inguinal hernias containing fat. Prostatic enlargement. Coronary artery calcification. Aortic Atherosclerosis (ICD10-I70.0).  Electronically Signed   By: Lavonia Dana M.D.   On: 08/21/2017 14:23   US Abdomen Limited Ruq  Result Date: 08/21/2017 CLINICAL DATA:  Right upper quadrant pain for the past 6 days. EXAM: ULTRASOUND ABDOMEN LIMITED RIGHT UPPER QUADRANT COMPARISON:  None. FINDINGS: Gallbladder: Mild focal gallbladder wall thickening up to 6 mm. No gallstones visualized. No sonographic Murphy sign noted by sonographer. Common bile duct: Diameter: 4 mm, normal. Liver: No focal lesion identified. Within normal limits in parenchymal echogenicity. Portal vein is patent on color Doppler imaging with normal direction of blood flow towards the liver. IMPRESSION: 1. Mild gallbladder wall thickening up to 6 mm, nonspecific. No gallstones seen. Electronically Signed   By: Titus Dubin M.D.   On: 08/21/2017 13:38    Assessment and Plan: This is a 76 y.o. male who presents with abdominal pain and findings concerning for acute cholecystitis.  I have independently viewed the patient's imaging studies and reviewed his laboratory studies.  Though ultrasound does not show any stones, it is uncommon to see acalculous cholecystitis in outpatient setting.  I would presume that there are stones or sludge that were simply not visualized during ultrasound.    Patient will be admitted to surgical team.  Will be NPO with IV fluid hydration.  Will be started on IV antibiotics and appropriate pain control.  Will discuss further with patient in the morning regarding possible cholecystectomy, depending on his clinical condition.  Patient had troponin of 0.03 on initial labs but follow up was normal, likely due to increased demand given cholecystitis.  Will hold ASA for now but otherwise continue his essential meds and start sliding scale insulin for diabetes management.  Patient understands this plan and all of his questions have been answered.   Melvyn Neth, [place]

## 2017-08-21 NOTE — ED Notes (Signed)
Pt is back from hyda scan. Awaiting results.

## 2017-08-21 NOTE — ED Notes (Signed)
Returned from U/S

## 2017-08-21 NOTE — ED Notes (Signed)
Pt is resting peacefully in bed with wife at bedside. Pt has no complaints of pain at this time.

## 2017-08-21 NOTE — ED Notes (Signed)
Patient transported to Ultrasound 

## 2017-08-21 NOTE — Telephone Encounter (Signed)
Noted  

## 2017-08-21 NOTE — ED Provider Notes (Signed)
HIDA scan has returned as positive. I called Dr. Rosana Hoes surgeon he will come down and see the patient.   Guy Polio, MD 08/21/17 [phone]

## 2017-08-21 NOTE — ED Notes (Signed)
Returned from CT.

## 2017-08-21 NOTE — Telephone Encounter (Signed)
FYI

## 2017-08-21 NOTE — ED Triage Notes (Signed)
Patient c/o RUQ pain/pressure since Saturday. Patient c/o pain when he takes a deep breath. Ambulatory with grimace on face. Patient reports losing his appetite and not eating much lately since pain started. Denies N/V/D

## 2017-08-21 NOTE — Progress Notes (Signed)
Pharmacy Antibiotic Note  BERGEN MAGNER is a 76 y.o. male admitted on 08/21/2017 with intra-abdominal infection.  Pharmacy has been consulted for piperacillin/tazobactam dosing.  Plan: Piperacillin/tazobactam 3.375 g IV q8h EI  Height: 5\' 8"  (172.7 cm) Weight: 220 lb (99.8 kg) IBW/kg (Calculated) : 68.4  Temp (24hrs), Avg:98.2 F (36.8 C), Min:98.2 F (36.8 C), Max:98.2 F (36.8 C)  Recent Labs  Lab 08/21/17 1048  WBC 11.6*  CREATININE 1.26*    Estimated Creatinine Clearance: 58 mL/min (A) (by C-G formula based on SCr of 1.26 mg/dL (H)).    Allergies  Allergen Reactions  . Grapefruit Extract Other (See Comments)    Mouth sores  . Other Other (See Comments)   Antimicrobials this admission: Piperacillin/tazobactam 11/29 >>   Dose adjustments this admission:  Microbiology results:  Thank you for allowing pharmacy to be a part of this patient's care.  Lenis Noon, PharmD Clinical Pharmacist 08/21/2017 8:26 PM

## 2017-08-21 NOTE — Telephone Encounter (Signed)
Patient traveled for the holidays.  Had issues with blood sugar and blood pressure while in [place] visiting family.  Started C/O right side abdominal pain on Monday.  Pain is rated around 5-6 on pain scale per spouse.  Patient also screams out at times from pain.  No Vomiting. While on the phone with spouse, patient started C/O of difficulty taking in a  deep breath.  I advised spouse to go ahead and take him to the ED.  Patient and spouse agreed. Reason for Disposition . [1] MILD-MODERATE pain AND [2] constant AND [3] present > 2 hours    Pain off and on since Monday  Protocols used: ABDOMINAL PAIN - MALE-A-AH

## 2017-08-21 NOTE — ED Notes (Signed)
Pt left for Hyda scan. Wife waiting in room.

## 2017-08-22 ENCOUNTER — Observation Stay
Admit: 2017-08-22 | Discharge: 2017-08-22 | Disposition: A | Discharge status: Home / Self Care | Payer: Medicare Other | Attending: Cardiology | Admitting: Cardiology

## 2017-08-22 ENCOUNTER — Encounter: Payer: Self-pay | Admitting: Anesthesiology

## 2017-08-22 DIAGNOSIS — R011 Cardiac murmur, unspecified: Secondary | ICD-10-CM | POA: Diagnosis not present

## 2017-08-22 DIAGNOSIS — I10 Essential (primary) hypertension: Secondary | ICD-10-CM | POA: Diagnosis not present

## 2017-08-22 DIAGNOSIS — K810 Acute cholecystitis: Secondary | ICD-10-CM

## 2017-08-22 DIAGNOSIS — I2510 Atherosclerotic heart disease of native coronary artery without angina pectoris: Secondary | ICD-10-CM | POA: Diagnosis not present

## 2017-08-22 DIAGNOSIS — I359 Nonrheumatic aortic valve disorder, unspecified: Secondary | ICD-10-CM | POA: Diagnosis not present

## 2017-08-22 DIAGNOSIS — E860 Dehydration: Secondary | ICD-10-CM | POA: Diagnosis not present

## 2017-08-22 DIAGNOSIS — K811 Chronic cholecystitis: Secondary | ICD-10-CM | POA: Diagnosis not present

## 2017-08-22 LAB — CBC
HCT: 41.2 % (ref 40.0–52.0)
HEMOGLOBIN: 13.9 g/dL (ref 13.0–18.0)
MCH: 31.1 pg (ref 26.0–34.0)
MCHC: 33.6 g/dL (ref 32.0–36.0)
MCV: 92.6 fL (ref 80.0–100.0)
PLATELETS: 179 10*3/uL (ref 150–440)
RBC: 4.45 MIL/uL (ref 4.40–5.90)
RDW: 13.1 % (ref 11.5–14.5)
WBC: 14.3 10*3/uL — AB (ref 3.8–10.6)

## 2017-08-22 LAB — COMPREHENSIVE METABOLIC PANEL
ALK PHOS: 74 U/L (ref 38–126)
ALT: 15 U/L — AB (ref 17–63)
AST: 17 U/L (ref 15–41)
Albumin: 3.4 g/dL — ABNORMAL LOW (ref 3.5–5.0)
Anion gap: 13 (ref 5–15)
BUN: 23 mg/dL — AB (ref 6–20)
CALCIUM: 8.7 mg/dL — AB (ref 8.9–10.3)
CHLORIDE: 99 mmol/L — AB (ref 101–111)
CO2: 22 mmol/L (ref 22–32)
CREATININE: 1.02 mg/dL (ref 0.61–1.24)
GFR calc Af Amer: >60 mL/min (ref >60)
Glucose, Bld: 199 mg/dL — ABNORMAL HIGH (ref 65–99)
Potassium: 3.5 mmol/L (ref 3.5–5.1)
Sodium: 134 mmol/L — ABNORMAL LOW (ref 135–145)
Total Bilirubin: 2.5 mg/dL — ABNORMAL HIGH (ref 0.3–1.2)
Total Protein: 6.8 g/dL (ref 6.5–8.1)

## 2017-08-22 LAB — GLUCOSE, CAPILLARY
GLUCOSE-CAPILLARY: 187 mg/dL — AB (ref 65–99)
GLUCOSE-CAPILLARY: 192 mg/dL — AB (ref 65–99)
GLUCOSE-CAPILLARY: 174 mg/dL — AB (ref 65–99)
GLUCOSE-CAPILLARY: 146 mg/dL — AB (ref 65–99)
Glucose-Capillary: 142 mg/dL — ABNORMAL HIGH (ref 65–99)
Glucose-Capillary: 159 mg/dL — ABNORMAL HIGH (ref 65–99)
Glucose-Capillary: 190 mg/dL — ABNORMAL HIGH (ref 65–99)

## 2017-08-22 LAB — MAGNESIUM: Magnesium: 1.7 mg/dL (ref 1.7–2.4)

## 2017-08-22 MED ORDER — ROSUVASTATIN CALCIUM 10 MG PO TABS
20.0000 mg | ORAL_TABLET | Freq: Every day | ORAL | Status: DC
  Administered 2017-08-22 – 2017-08-23 (×2): 20 mg via ORAL
  Filled 2017-08-22 (×2): qty 2

## 2017-08-22 NOTE — Anesthesia Preprocedure Evaluation (Addendum)
Anesthesia Evaluation  Patient identified by MRN, date of birth, ID band Patient awake    Reviewed: Allergy & Precautions, H&P , NPO status , Patient's Chart, lab work & pertinent test results, reviewed documented beta blocker date and time   History of Anesthesia Complications Negative for: history of anesthetic complications  Airway Mallampati: III  TM Distance: >3 FB Neck ROM: full    Dental no notable dental hx. (+) Caps, Implants, Dental Advidsory Given Permanent bridge:   Pulmonary neg shortness of breath, sleep apnea (based on history) , neg COPD, neg recent URI, former smoker,           Cardiovascular Exercise Tolerance: Good hypertension, (-) angina+ CAD, + Past MI and + CABG  (-) Cardiac Stents negative cardio ROS  (-) dysrhythmias + Valvular Problems/Murmurs (s/p AVR)      Neuro/Psych neg Seizures CVA, No Residual Symptoms negative psych ROS   GI/Hepatic negative GI ROS, Neg liver ROS,   Endo/Other  diabetes, Poorly Controlled, Type 2, Oral Hypoglycemic Agents  Renal/GU Renal disease (kidney stones)  negative genitourinary   Musculoskeletal   Abdominal   Peds  Hematology negative hematology ROS (+)   Anesthesia Other Findings Past Medical History: No date: CAD (coronary artery disease)     Comment:  s/p MI 2011 Cath showed 100% occlusion of RCA with               collaterals open, followed by Dr. Ubaldo Glassing No date: Dental bridge present     Comment:  also caps and implants Dx 2005: Diabetes mellitus     Comment:  HgbA1c 6.8%, Optho [facility] 2001 No date: Hernia     Comment:  Umbilical No date: Hyperlipidemia No date: Hypertension No date: Kidney stone     Comment:  2008 No date: Squamous cell cancer of scalp and skin of neck     Comment:  Dr. Evorn Gong 05/20/2014: Stroke ([place])     Comment:  small left hemispheric cortical stroke following CABG.                No deficits No date: Uses  hearing aid     Comment:  has.  Doesn't usually wear   Reproductive/Obstetrics negative OB ROS                            Anesthesia Physical Anesthesia Plan  ASA: III  Anesthesia Plan: General   Post-op Pain Management:    Induction: Intravenous  PONV Risk Score and Plan: 2 and Ondansetron and Dexamethasone  Airway Management Planned: Oral ETT  Additional Equipment:   Intra-op Plan:   Post-operative Plan: Extubation in OR  Informed Consent: I have reviewed the patients History and Physical, chart, labs and discussed the procedure including the risks, benefits and alternatives for the proposed anesthesia with the patient or authorized representative who has indicated his/her understanding and acceptance.   Dental Advisory Given  Plan Discussed with: Anesthesiologist, CRNA and Surgeon  Anesthesia Plan Comments: (Agree with request for cardiology consult )       Anesthesia Quick Evaluation

## 2017-08-22 NOTE — Progress Notes (Signed)
Pharmacy Antibiotic Note  Guy Phillips is a 76 y.o. male admitted on 08/21/2017 with intra-abdominal infection.  Pharmacy has been consulted for piperacillin/tazobactam dosing.  Plan: Continue piperacillin/tazobactam 3.375 g IV q8h EI  Height: 5\' 8"  (172.7 cm) Weight: 217 lb 1.6 oz (98.5 kg) IBW/kg (Calculated) : 68.4  Temp (24hrs), Avg:98.9 F (37.2 C), Min:98.2 F (36.8 C), Max:99.5 F (37.5 C)  Recent Labs  Lab 08/21/17 1048 08/22/17 0630  WBC 11.6* 14.3*  CREATININE 1.26* 1.02    Estimated Creatinine Clearance: 71.2 mL/min (by C-G formula based on SCr of 1.02 mg/dL).    Allergies  Allergen Reactions  . Grapefruit Extract Other (See Comments)    Mouth sores  . Other Other (See Comments)   Antimicrobials this admission: Piperacillin/tazobactam 11/29 >>   Microbiology results: None obtained  Thank you for allowing pharmacy to be a part of this patient's care.  Lenis Noon, PharmD Clinical Pharmacist 08/22/2017 10:11 AM

## 2017-08-22 NOTE — Consult Note (Signed)
[place]  CARDIOLOGY CONSULT NOTE  Patient ID: Guy Phillips MRN: [id number] DOB/AGE: October 05, 1940 76 y.o.  Admit date: 08/21/2017 Referring Physician Dr. Hampton Abbot Primary Physician   Primary Cardiologist Dr. Dena Billet, [facility] Reason for Consultation preop  HPI: Guy Phillips is a 76 y.o. male who had CABG and AVR with a bovine prosthesis with Dr. Norm Parcel on 05/19/2014. This was complicated by left hemispheric cortical stroke on 8/28. He had a bioprosthetic AVR placed. A follow-up echo revealed normal AV valve function,  mitral calcium and normal LV. Pt has been doing great from a cadiac standoint and is active in the gym 3 days a week on the treadmill and elliptical with no chest pain or sob. He is now admitted with right epigastric pain and was noted to have evidence gall bladder disease with a positive HIDA scan and gall bladder wall thickening. He is being considered for cholcystectomy tomorrow. EKG reveals nsr with chronic lbbb. He denies chest pain.    Review of Systems  Constitutional: Negative.   HENT: Negative.   Eyes: Negative.   Respiratory: Negative.   Cardiovascular: Negative.   Gastrointestinal: Positive for abdominal pain.  Genitourinary: Negative.   Musculoskeletal: Negative.   Skin: Negative.   Neurological: Negative.   Endo/Heme/Allergies: Negative.   Psychiatric/Behavioral: Negative.     Past Medical History:  Diagnosis Date  . CAD (coronary artery disease)    s/p MI 2011 Cath showed 100% occlusion of RCA with collaterals open, followed by Dr. Ubaldo Glassing  . Dental bridge present    also caps and implants  . Diabetes mellitus Dx 2005   HgbA1c 6.8%, Optho [facility] 2001  . Hernia    Umbilical  . Hyperlipidemia   . Hypertension   . Kidney stone    2008  . 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 Evorn Gong  . Stroke ([place]) 05/20/2014   small left hemispheric cortical stroke  following CABG.  No deficits  . Uses hearing aid    has.  Doesn't usually wear    Family History  Problem Relation Age of Onset  . Cancer Father        liver    Social History   Socioeconomic History  . Marital status: Married    Spouse name: Not on file  . Number of children: Not on file  . Years of education: Not on file  . Highest education level: Not on file  Social Needs  . Financial resource strain: Not on file  . Food insecurity - worry: Not on file  . Food insecurity - inability: Not on file  . Transportation needs - medical: Not on file  . Transportation needs - non-medical: Not on file  Occupational History  . Not on file  Tobacco Use  . Smoking status: Former Smoker    Last attempt to quit: 07/03/1991    Years since quitting: 26.1  . Smokeless tobacco: Never Used  Substance and Sexual Activity  . Alcohol use: Yes    Comment: 1 glass of wine occ  . Drug use: No  . Sexual activity: No  Other Topics Concern  . Not on file  Social History Narrative  . Not on file    Past Surgical History:  Procedure Laterality Date  . COLONOSCOPY WITH PROPOFOL N/A 04/28/2017   Procedure: COLONOSCOPY WITH PROPOFOL;  Surgeon: Lucilla Lame, MD;  Location: [facility];  Service: Gastroenterology;  Laterality: N/A;  Diabetic - oral meds  . CORONARY ARTERY BYPASS GRAFT  2015   x3 vessels, with AVR  . EYE SURGERY     cataract extraction, bilateral  . FUNCTIONAL ENDOSCOPIC SINUS SURGERY     Dr. Fatima Sanger in [place]  . POLYPECTOMY  04/28/2017   Procedure: POLYPECTOMY;  Surgeon: Lucilla Lame, MD;  Location: [facility];  Service: Gastroenterology;;     Medications Prior to Admission  Medication Sig Dispense Refill Last Dose  . aspirin 81 MG tablet Take 81 mg by mouth daily.     08/21/2017 at 0800  . Cholecalciferol (VITAMIN D3) 2000 UNITS TABS Take 1 tablet by mouth daily.     08/21/2017 at 0800  . fesoterodine (TOVIAZ) 8 MG TB24 tablet Take 8 mg by mouth daily.   08/21/2017  at 0800  . fexofenadine (ALLEGRA) 180 MG tablet Take 180 mg by mouth daily.   08/21/2017 at 0800  . fluticasone (FLONASE) 50 MCG/ACT nasal spray Place into the nose Daily.   08/21/2017 at 0800  . glipiZIDE (GLUCOTROL) 5 MG tablet TAKE 1 TABLET BY MOUTH TWICE A DAY BEFORE MEALS 180 tablet 3 08/21/2017 at 0800  . metFORMIN (GLUCOPHAGE) 1000 MG tablet Take 1 tablet (1,000 mg total) by mouth 2 (two) times daily with a meal. 180 tablet 3 08/21/2017 at 0800  . metoprolol tartrate (LOPRESSOR) 25 MG tablet Take 12.5 mg by mouth daily. Take half tablet in the am and half in pm    08/21/2017 at 0800  . Multiple Vitamin (MULTIVITAMIN) tablet Take 1 tablet by mouth daily.     08/21/2017 at 0800  . rosuvastatin (CRESTOR) 20 MG tablet Take 1 tablet (20 mg total) by mouth daily. 90 tablet 3 08/21/2017 at 0800  . triamterene-hydrochlorothiazide (DYAZIDE) 37.5-25 MG capsule Take 1 capsule by mouth daily.   08/21/2017 at 0800  . acetaminophen (TYLENOL) 325 MG tablet Take by mouth.   prn at prn  . Blood Glucose Monitoring Suppl (ONE TOUCH ULTRA SYSTEM KIT) w/Device KIT 1 kit by Does not apply route once. 1 each 0 Taking  . glucose blood (ONE TOUCH ULTRA TEST) test strip Use as instructed 100 each 12 Taking  . nitroGLYCERIN (NITROSTAT) 0.4 MG SL tablet Place 0.4 mg under the tongue every 5 (five) minutes as needed.   prn at prn    Physical Exam: Blood pressure (!) 121/59, pulse 89, temperature 98.9 F (37.2 C), temperature source Oral, resp. rate 16, height '5\' 8"'$  (1.727 m), weight 98.5 kg (217 lb 1.6 oz), SpO2 95 %.   Wt Readings from Last 1 Encounters:  08/21/17 98.5 kg (217 lb 1.6 oz)     General appearance: alert and cooperative Head: Normocephalic, without obvious abnormality, atraumatic Resp: clear to auscultation bilaterally Cardio: regular rate and rhythm and systolic murmur: late systolic 2/6, crescendo and decrescendo at lower left sternal border GI: abnormal findings:  mild tenderness in the  RUQ Extremities: extremities normal, atraumatic, no cyanosis or edema Neurologic: Grossly normal  Labs:   Lab Results  Component Value Date   WBC 14.3 (H) 08/22/2017   HGB 13.9 08/22/2017   HCT 41.2 08/22/2017   MCV 92.6 08/22/2017   PLT 179 08/22/2017    Recent Labs  Lab 08/22/17 0630  NA 134*  K 3.5  CL 99*  CO2 22  BUN 23*  CREATININE 1.02  CALCIUM 8.7*  PROT 6.8  BILITOT 2.5*  ALKPHOS 74  ALT 15*  AST 17  GLUCOSE 199*  Lab Results  Component Value Date   TROPONINI <0.03 08/21/2017      ASSESSMENT AND PLAN:  76 y.o. male who had CABG and AVR with Dr. Norm Parcel on 05/19/2014. This was complicated by left hemispheric cortical stroke on 8/28. He had a bioprosthetic AVR placed. A follow-up echo revealed normal AV valve function, severe mitral calcium and normal LV. He is doing well from a cardiac standpoint and is very active with no complaints of chest paipn or sob. Will review echo cardiogram when available and if unchanged from previous one, would proceed with surgery with routine cardiac monitoring as he appears to be at low to medium risk from cardiac standpoint and is well optimized. Would treat prophylactically with abx preop. Pt has a tissue prosthesis but would premedicate with abx.    Signed: Teodoro Spray MD, [facility] 08/22/2017, 6:04 PM

## 2017-08-22 NOTE — Consult Note (Signed)
[place] [facility] [place] NAME: Guy Phillips    MR#:  [id number]  DATE OF BIRTH:  21-Apr-1941  DATE OF ADMISSION:  08/21/2017  PRIMARY CARE PHYSICIAN: Leone Haven, MD   REQUESTING/REFERRING PHYSICIAN: Vickie Epley, MD  CHIEF COMPLAINT:   Chief Complaint  Patient presents with  . Abdominal Pain    RUQ   RUQ pain. HISTORY OF PRESENT ILLNESS:  Guy Phillips  is a 76 y.o. male with a known history of CAD, CVA, HTN, DM and HLP. He is admitted for  acute cholecystitis. Dr. Rosana Hoes requested consult for preop evaluation. The patient denies any chest pain, palpitation or SOB. PAST MEDICAL HISTORY:   Past Medical History:  Diagnosis Date  . CAD (coronary artery disease)    s/p MI 2011 Cath showed 100% occlusion of RCA with collaterals open, followed by Dr. Ubaldo Glassing  . Dental bridge present    also caps and implants  . Diabetes mellitus Dx 2005   HgbA1c 6.8%, [facility]  . Hernia    Umbilical  . Hyperlipidemia   . Hypertension   . Kidney stone    2008  . 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 Evorn Gong  . Stroke ([place]) 05/20/2014   small left hemispheric cortical stroke following CABG.  No deficits  . Uses hearing aid    has.  Doesn't usually wear    PAST SURGICAL HISTORY:   Past Surgical History:  Procedure Laterality Date  . COLONOSCOPY WITH PROPOFOL N/A 04/28/2017   Procedure: COLONOSCOPY WITH PROPOFOL;  Surgeon: Lucilla Lame, MD;  Location: [facility];  Service: Gastroenterology;  Laterality: N/A;  Diabetic - oral meds  . CORONARY ARTERY BYPASS GRAFT  2015   x3 vessels, with AVR  . EYE SURGERY     cataract extraction, bilateral  . FUNCTIONAL ENDOSCOPIC SINUS SURGERY     Dr. Fatima Sanger in [place]  . POLYPECTOMY  04/28/2017   Procedure: POLYPECTOMY;  Surgeon: Lucilla Lame, MD;  Location: [facility];  Service: Gastroenterology;;    SOCIAL HISTORY:   Social History    Tobacco Use  . Smoking status: Former Smoker    Last attempt to quit: 07/03/1991    Years since quitting: 26.1  . Smokeless tobacco: Never Used  Substance Use Topics  . Alcohol use: Yes    Comment: 1 glass of wine occ    FAMILY HISTORY:   Family History  Problem Relation Age of Onset  . Cancer Father        liver    DRUG ALLERGIES:   Allergies  Allergen Reactions  . Grapefruit Extract Other (See Comments)    Mouth sores  . Other Other (See Comments)    REVIEW OF SYSTEMS:   Review of Systems  Constitutional: Negative for chills, fever and malaise/fatigue.  HENT: Negative for sore throat.   Eyes: Negative for blurred vision and double vision.  Respiratory: Negative for cough, hemoptysis, shortness of breath, wheezing and stridor.   Cardiovascular: Negative for chest pain, palpitations, orthopnea and leg swelling.  Gastrointestinal: Positive for abdominal pain. Negative for blood in stool, diarrhea, melena, nausea and vomiting.  Genitourinary: Negative for dysuria, flank pain and hematuria.  Musculoskeletal: Negative for back pain and joint pain.  Neurological: Negative for dizziness, sensory change, focal weakness, seizures, loss of consciousness, weakness and headaches.  Endo/Heme/Allergies: Negative for polydipsia.  Psychiatric/Behavioral: Negative for depression. The patient is not nervous/anxious.  MEDICATIONS AT HOME:   Prior to Admission medications   Medication Sig Start Date End Date Taking? Authorizing Provider  aspirin 81 MG tablet Take 81 mg by mouth daily.     Yes [provider]  Cholecalciferol (VITAMIN D3) 2000 UNITS TABS Take 1 tablet by mouth daily.     Yes [provider]  fesoterodine (TOVIAZ) 8 MG TB24 tablet Take 8 mg by mouth daily.   Yes [provider]  fexofenadine (ALLEGRA) 180 MG tablet Take 180 mg by mouth daily.   Yes [provider]  fluticasone (FLONASE) 50 MCG/ACT nasal spray Place into the  nose Daily. 11/20/11  Yes [provider]  glipiZIDE (GLUCOTROL) 5 MG tablet TAKE 1 TABLET BY MOUTH TWICE A DAY BEFORE MEALS 10/23/16  Yes Leone Haven, MD  metFORMIN (GLUCOPHAGE) 1000 MG tablet Take 1 tablet (1,000 mg total) by mouth 2 (two) times daily with a meal. 12/06/16  Yes Leone Haven, MD  metoprolol tartrate (LOPRESSOR) 25 MG tablet Take 12.5 mg by mouth daily. Take half tablet in the am and half in pm    Yes [provider]  Multiple Vitamin (MULTIVITAMIN) tablet Take 1 tablet by mouth daily.     Yes [provider]  rosuvastatin (CRESTOR) 20 MG tablet Take 1 tablet (20 mg total) by mouth daily. 11/27/16  Yes Leone Haven, MD  triamterene-hydrochlorothiazide (DYAZIDE) 37.5-25 MG capsule Take 1 capsule by mouth daily.   Yes [provider]  acetaminophen (TYLENOL) 325 MG tablet Take by mouth.    [provider]  Blood Glucose Monitoring Suppl (ONE TOUCH ULTRA SYSTEM KIT) w/Device KIT 1 kit by Does not apply route once. 10/30/15   Jackolyn Confer, MD  glucose blood (ONE TOUCH ULTRA TEST) test strip Use as instructed 03/19/17   Leone Haven, MD  nitroGLYCERIN (NITROSTAT) 0.4 MG SL tablet Place 0.4 mg under the tongue every 5 (five) minutes as needed.    [provider]      VITAL SIGNS:  Blood pressure 116/73, pulse (!) 105, temperature 99 F (37.2 C), temperature source Oral, resp. rate 16, height '5\' 8"'$  (1.727 m), weight 217 lb 1.6 oz (98.5 kg), SpO2 95 %.  PHYSICAL EXAMINATION:  Physical Exam  GENERAL:  76 y.o.-year-old patient lying in the bed with no acute distress. Obese. EYES: Pupils equal, round, reactive to light and accommodation. No scleral icterus. Extraocular muscles intact.  HEENT: Head atraumatic, normocephalic. Oropharynx and nasopharynx clear.  NECK:  Supple, no jugular venous distention. No thyroid enlargement, no tenderness.  LUNGS: Normal breath sounds bilaterally, no wheezing, rales,rhonchi  or crepitation. No use of accessory muscles of respiration.  CARDIOVASCULAR: S1, S2 normal. No murmurs, rubs, or gallops.  ABDOMEN: Soft, tenderness in RUQ, nondistended. Bowel sounds present. No organomegaly or mass.  EXTREMITIES: No pedal edema, cyanosis, or clubbing.  NEUROLOGIC: Cranial nerves II through XII are intact. Muscle strength 5/5 in all extremities. Sensation intact. Gait not checked.  PSYCHIATRIC: The patient is alert and oriented x 3.  SKIN: No obvious rash, lesion, or ulcer.   LABORATORY PANEL:   CBC Recent Labs  Lab 08/22/17 0630  WBC 14.3*  HGB 13.9  HCT 41.2  PLT 179   ------------------------------------------------------------------------------------------------------------------  Chemistries  Recent Labs  Lab 08/22/17 0630  NA 134*  K 3.5  CL 99*  CO2 22  GLUCOSE 199*  BUN 23*  CREATININE 1.02  CALCIUM 8.7*  MG 1.7  AST 17  ALT 15*  ALKPHOS 81  BILITOT 2.5*   ------------------------------------------------------------------------------------------------------------------  Cardiac Enzymes Recent Labs  Lab 08/21/17 1814  TROPONINI <0.03   ------------------------------------------------------------------------------------------------------------------  RADIOLOGY:  Nm Hepatobiliary Liver Func  Result Date: 08/21/2017 CLINICAL DATA:  Right upper quadrant pain EXAM: NUCLEAR MEDICINE HEPATOBILIARY IMAGING TECHNIQUE: Sequential images of the abdomen were obtained out to 60 minutes following intravenous administration of radiopharmaceutical. 4 mg morphine administered at approximately 2 hours. Patient reinjected with 2 millicurie of technetium 19 M Choletec. RADIOPHARMACEUTICALS:  Total dose of 7.52 mCi Tc-69m Choletec IV COMPARISON:  CT 08/21/2017, ultrasound 08/21/2017 FINDINGS: Prompt uptake and biliary excretion of activity by the liver is seen. Gallbladder is non visualized at 1 hour. Following morphine injection, gallbladder is non  identified. Normal biliary to bowel transit time. IMPRESSION: Nonvisualization of the gallbladder despite morphine augmentation. Findings would be concerning for acute acalculous cholecystitis. Electronically Signed   By: KDonavan FoilM.D.   On: 08/21/2017 18:24   Ct Renal Stone Study  Result Date: 08/21/2017 CLINICAL DATA:  RIGHT-side abdominal pain and pressure since Saturday, worsening with taking a deep breath, history of kidney stones, diabetes mellitus, hypertension, coronary artery disease EXAM: CT ABDOMEN AND PELVIS WITHOUT CONTRAST TECHNIQUE: Multidetector CT imaging of the abdomen and pelvis was performed following the standard protocol without IV contrast. COMPARISON:  None FINDINGS: Lower chest: Lung bases clear Hepatobiliary: Liver unremarkable. Mild gallbladder wall thickening with hazy pericolic infiltration high suspicious for acute cholecystitis. No definite calcified gallstones are seen. Small lymph node adjacent to the lower gallbladder segment 8 mm short axis image 29. Pancreas: Normal appearance Spleen: Normal appearance. Small splenule anterior inferior to spleen Adrenals/Urinary Tract: Adrenal glands normal appearance. Kidneys, ureters, and bladder normal appearance Stomach/Bowel: Normal appendix. Sigmoid diverticulosis without evidence of diverticulitis. Stomach decompressed with suboptimal assessment of gastric wall thickness. Small bowel loops unremarkable. Vascular/Lymphatic: Atherosclerotic calcifications aorta and iliac arteries extending into femoral arteries. Aorta normal caliber without aneurysm. Coronary arterial calcifications and calcifications at the aortic root noted. Mitral annular and question aortic valvular calcifications. Reproductive: Prostatic enlargement, gland 6.4 x 4.0 cm image 88. Other: Small BILATERAL inguinal hernias containing fat. No free air or free fluid. Musculoskeletal: Bones demineralized. IMPRESSION: Gallbladder wall thickening with pericholecystic  infiltration suspicious for acute cholecystitis. Sigmoid diverticulosis without evidence of diverticulitis. BILATERAL inguinal hernias containing fat. Prostatic enlargement. Coronary artery calcification. Aortic Atherosclerosis (ICD10-I70.0). Electronically Signed   By: MLavonia DanaM.D.   On: 08/21/2017 14:23   UKoreaAbdomen Limited Ruq  Result Date: 08/21/2017 CLINICAL DATA:  Right upper quadrant pain for the past 6 days. EXAM: ULTRASOUND ABDOMEN LIMITED RIGHT UPPER QUADRANT COMPARISON:  None. FINDINGS: Gallbladder: Mild focal gallbladder wall thickening up to 6 mm. No gallstones visualized. No sonographic Murphy sign noted by sonographer. Common bile duct: Diameter: 4 mm, normal. Liver: No focal lesion identified. Within normal limits in parenchymal echogenicity. Portal vein is patent on color Doppler imaging with normal direction of blood flow towards the liver. IMPRESSION: 1. Mild gallbladder wall thickening up to 6 mm, nonspecific. No gallstones seen. Electronically Signed   By: WTitus DubinM.D.   On: 08/21/2017 13:38      IMPRESSION AND PLAN:   Acute cholecystitis with leukocytosis. Moderate risk for cholecystectomy. Hold ASA, continue lopressor. Continue abx and follow up CBC.  Dehydration. IVF and f/u BMP.  CAD, s/p CABG, EKG: LBBB. First degree AV block. Cardiology consult. HTN. Continue lopressor. DM, sliding scale. History of CVA. Hold ASA for surgery.  All the records are reviewed  and case discussed with ED provider. Management plans discussed with the patient, his wife and they are in agreement.  CODE STATUS: FULL CODE.  TOTAL TIME TAKING CARE OF THIS PATIENT: 52 minutes.    Demetrios Loll M.D on 08/22/2017 at 9:57 AM  Between 7am to 6pm - Pager - [phone]  After 6pm go to [email or web address] - Technical brewer [facility]  Office  [phone]  CC: Primary care physician; Leone Haven, MD   Note: This dictation was prepared with  Dragon dictation along with smaller phrase technology. Any transcriptional errors that result from this process are unin

## 2017-08-22 NOTE — Progress Notes (Signed)
Initial Nutrition Assessment  DOCUMENTATION CODES:   Obesity unspecified  INTERVENTION:   RD will monitor for the need for nutrition supplements.   RD discussed with pt the importance of adequate protein needed for healing and recommended low fat diet for the next 2-3 weeks.   NUTRITION DIAGNOSIS:   Inadequate oral intake related to acute illness as evidenced by NPO status, 2 percent weight loss in one week.  GOAL:   Patient will meet greater than or equal to 90% of their needs  MONITOR:   Diet advancement, PO intake, Labs, Weight trends  REASON FOR ASSESSMENT:   Malnutrition Screening Tool    ASSESSMENT:   76 y.o. male who presents with abdominal pain and findings concerning for acute cholecystitis  Met with pt in room today. Pt reports good appetite and oral intake up until yesterday. Pt also reports a slow decline in his appetite over the past few years. Per chart, pt has lost 16lbs(7%) over the past year; this is not significant given the time frame. Pt reports a 5lb(2%) wt loss over the past week; this is significant given his recent cholecystitis diagnosis. Per MD note, "considering pain has improved, would prefer to repeat morning LFT's with decreasing bilirubin prior to cholecystectomy with likely intraoperative cholangiogram." Pt advanced to clear liquids today; NPO after midnight. RD will monitor for the need for supplements once pt is post op.   Medications reviewed and include: lovenox, insulin, protonix, LRS _0 /hr, zosyn   Labs reviewed: Na 134(L), Cl 99(L), BUN 23(H), Ca 8.7(L) adj. 9.18 wnl, alb 3.4(L), tbili 2.5(H)  Nutrition-Focused physical exam completed. Findings are no fat depletion, mild muscle depletions in BLE, and no edema.   Diet Order:  Diet clear liquid Room service appropriate? Yes; Fluid consistency: Thin Diet NPO time specified Except for: Sips with Meds  EDUCATION NEEDS:   Education needs have been addressed  Skin: Reviewed RN  Assessment  Last BM:  11/27  Height:   Ht Readings from Last 1 Encounters:  08/21/17 _1  (1.727 m)    Weight:   Wt Readings from Last 1 Encounters:  08/21/17 217 lb 1.6 oz (98.5 kg)    Ideal Body Weight:  63.6 kg  BMI:  Body mass index is 33.01 kg/m.  Estimated Nutritional Needs:   Kcal:  1900-2200kcal/day   Protein:  99-108g/day   Fluid:  >1.9L/day   Koleen Distance MS, RD, LDN Pager #[phone] After Hours Pager: [phone]

## 2017-08-22 NOTE — Progress Notes (Addendum)
SURGICAL PROGRESS NOTE (cpt [phone])  Hospital Day(s): 0.   Post op day(s):  Marland Kitchen   Interval History: Patient seen and examined, no acute events or new complaints overnight since admission. Patient reports his RUQ abdominal pain (which he describes began following Thanksgiving dinner, but resolved until recurring 4 days later without resolution), while still present, is "much less/better" than when he presented to [facility] ED yesterday. He also denies any N/V, fever/chills, CP, or SOB and asks if he can have something to eat or drink. Of note, though patient has a history including DM, MI, AVR and CABG, complicated by stroke, he has since quit smoking, lost significant weight, checks his blood glucose at home regularly, and runs on a treadmill 20 minutes and rides a stationary bicycle "for a while" 3 x per week without CP or SOB.   Patient also previously underwent umbilical hernia repair with mesh.  Review of Systems:  Constitutional: denies fever, chills  HEENT: denies cough or congestion  Respiratory: denies any shortness of breath  Cardiovascular: denies chest pain or palpitations  Gastrointestinal: abdominal pain, N/V, and bowel function as per interval history Genitourinary: denies burning with urination or urinary frequency Musculoskeletal: denies pain, decreased motor or sensation Integumentary: denies any other rashes or skin discolorations Neurological: denies HA or vision/hearing changes   Vital signs in last 24 hours: [min-max] current  Temp:  [98.2 F (36.8 C)-99.5 F (37.5 C)] 99 F (37.2 C) (11/30 0416) Pulse Rate:  [82-122] 114 (11/30 0512) Resp:  [10-18] 16 (11/30 0416) BP: (109-151)/(62-118) 135/66 (11/30 0416) SpO2:  [92 %-97 %] 95 % (11/30 0512) Weight:  [217 lb 1.6 oz (98.5 kg)-220 lb (99.8 kg)] 217 lb 1.6 oz (98.5 kg) (11/29 2111)     Height: 5\' 8"  (172.7 cm) Weight: 217 lb 1.6 oz (98.5 kg) BMI (Calculated): 33.02   Intake/Output this shift:  No intake/output data  recorded.   Intake/Output last 2 shifts:  @IOLAST2SHIFTS @   Physical Exam:  Constitutional: alert, cooperative and no distress  HENT: normocephalic without obvious abnormality  Eyes: PERRL, EOM's grossly intact and symmetric  Neuro: CN II - XII grossly intact and symmetric without deficit  Respiratory: breathing non-labored at rest  Cardiovascular: regular rate and sinus rhythm  Gastrointestinal: soft, moderately overweight, and non-distended with mild-/moderate- RUQ abdominal tenderness to palpation, no guarding or rebound tenderness Musculoskeletal: UE and LE FROM, motor and sensation grossly intact, NT   Labs:  CBC Latest Ref Rng & Units 08/22/2017 08/21/2017 07/03/2011  WBC 3.8 - 10.6 K/uL 14.3(H) 11.6(H) 7.4  Hemoglobin 13.0 - 18.0 g/dL 13.9 15.3 13.7  Hematocrit 40.0 - 52.0 % 41.2 45.3 40.7  Platelets 150 - 440 K/uL 179 212 205.0   CMP Latest Ref Rng & Units 08/22/2017 08/21/2017 11/05/2016  Glucose 65 - 99 mg/dL 199(H) 157(H) 140(H)  BUN 6 - 20 mg/dL 23(H) 21(H) 17  Creatinine 0.61 - 1.24 mg/dL 1.02 1.26(H) 0.98  Sodium 135 - 145 mmol/L 134(L) 136 138  Potassium 3.5 - 5.1 mmol/L 3.5 3.7 3.9  Chloride 101 - 111 mmol/L 99(L) 97(L) 101  CO2 22 - 32 mmol/L 22 25 29   Calcium 8.9 - 10.3 mg/dL 8.7(L) 9.4 9.7  Total Protein 6.5 - 8.1 g/dL 6.8 7.7 7.0  Total Bilirubin 0.3 - 1.2 mg/dL 2.5(H) 1.9(H) 0.9  Alkaline Phos 38 - 126 U/L 74 70 67  AST 15 - 41 U/L 17 21 15   ALT 17 - 63 U/L 15(L) 16(L) 14   Imaging studies: No new  pertinent imaging studies, though admission ultrasound, CT, and HIDA personally reviewed with what appears to be a small amount of dependent sludge at the infundibulum and neck of the gallbladder on ultrasound  Assessment/Plan: (ICD-10's: K81.0, E80.6) 76 y.o. 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   - will continue antibiotics  - pain control prn, minimize narcotics  - medical risk stratification and optimization appreciated, though anticipate likely appropriate risk for cholecystectomy  - considering pain has improved, would prefer to repeat morning LFT's with decreasing bilirubin prior to cholecystectomy with likely intraoperative cholangiogram  - medical management of comorbidities per medical team  - okay with clear liquids today, NPO after midnight  - DVT prophylaxis, ambulation encouraged  All of the above findings and recommendations were discussed with the patient, patient's wife, and the medical team, and all of patient's and family's questions were answered to their expressed satisfaction.  -- Marilynne Drivers Rosana Hoes, MD, [place]: [facility] - Partnering for exceptional care. Office: [phone]

## 2017-08-22 NOTE — Care Management Obs Status (Signed)
[place] NOTIFICATION   Patient Details  Name: Guy Phillips MRN: [id number] Date of Birth: 1941-01-27   Medicare Observation Status Notification Given:  Yes    Beverly Sessions, RN 08/22/2017, 2:58 PM

## 2017-08-23 ENCOUNTER — Observation Stay: Payer: Medicare Other

## 2017-08-23 ENCOUNTER — Observation Stay: Payer: Medicare Other | Admitting: Anesthesiology

## 2017-08-23 ENCOUNTER — Encounter
Admission: EM | Disposition: A | Discharge status: Home / Self Care | Payer: Self-pay | Source: Home / Self Care | Attending: Surgery

## 2017-08-23 ENCOUNTER — Encounter: Payer: Self-pay | Admitting: Anesthesiology

## 2017-08-23 ENCOUNTER — Ambulatory Visit: Admission: RE | Admit: 2017-08-23 | Payer: Medicare Other | Source: Ambulatory Visit | Admitting: Surgery

## 2017-08-23 DIAGNOSIS — K811 Chronic cholecystitis: Secondary | ICD-10-CM | POA: Diagnosis not present

## 2017-08-23 DIAGNOSIS — Z87442 Personal history of urinary calculi: Secondary | ICD-10-CM | POA: Diagnosis not present

## 2017-08-23 DIAGNOSIS — K810 Acute cholecystitis: Secondary | ICD-10-CM | POA: Diagnosis not present

## 2017-08-23 DIAGNOSIS — Z87891 Personal history of nicotine dependence: Secondary | ICD-10-CM | POA: Diagnosis not present

## 2017-08-23 DIAGNOSIS — Z85828 Personal history of other malignant neoplasm of skin: Secondary | ICD-10-CM | POA: Diagnosis not present

## 2017-08-23 DIAGNOSIS — E669 Obesity, unspecified: Secondary | ICD-10-CM | POA: Diagnosis present

## 2017-08-23 DIAGNOSIS — I252 Old myocardial infarction: Secondary | ICD-10-CM | POA: Diagnosis not present

## 2017-08-23 DIAGNOSIS — E119 Type 2 diabetes mellitus without complications: Secondary | ICD-10-CM | POA: Diagnosis present

## 2017-08-23 DIAGNOSIS — Z7951 Long term (current) use of inhaled steroids: Secondary | ICD-10-CM | POA: Diagnosis not present

## 2017-08-23 DIAGNOSIS — I447 Left bundle-branch block, unspecified: Secondary | ICD-10-CM | POA: Diagnosis present

## 2017-08-23 DIAGNOSIS — R1011 Right upper quadrant pain: Secondary | ICD-10-CM | POA: Diagnosis not present

## 2017-08-23 DIAGNOSIS — K819 Cholecystitis, unspecified: Secondary | ICD-10-CM | POA: Diagnosis not present

## 2017-08-23 DIAGNOSIS — Z951 Presence of aortocoronary bypass graft: Secondary | ICD-10-CM | POA: Diagnosis not present

## 2017-08-23 DIAGNOSIS — I10 Essential (primary) hypertension: Secondary | ICD-10-CM | POA: Diagnosis present

## 2017-08-23 DIAGNOSIS — I2510 Atherosclerotic heart disease of native coronary artery without angina pectoris: Secondary | ICD-10-CM | POA: Diagnosis present

## 2017-08-23 DIAGNOSIS — I440 Atrioventricular block, first degree: Secondary | ICD-10-CM | POA: Diagnosis present

## 2017-08-23 DIAGNOSIS — Z7982 Long term (current) use of aspirin: Secondary | ICD-10-CM | POA: Diagnosis not present

## 2017-08-23 DIAGNOSIS — K8012 Calculus of gallbladder with acute and chronic cholecystitis without obstruction: Secondary | ICD-10-CM | POA: Diagnosis present

## 2017-08-23 DIAGNOSIS — Z800 Family history of malignant neoplasm of digestive organs: Secondary | ICD-10-CM | POA: Diagnosis not present

## 2017-08-23 DIAGNOSIS — Z6833 Body mass index (BMI) 33.0-33.9, adult: Secondary | ICD-10-CM | POA: Diagnosis not present

## 2017-08-23 DIAGNOSIS — Z91018 Allergy to other foods: Secondary | ICD-10-CM | POA: Diagnosis not present

## 2017-08-23 DIAGNOSIS — K82A1 Gangrene of gallbladder in cholecystitis: Secondary | ICD-10-CM | POA: Diagnosis present

## 2017-08-23 DIAGNOSIS — E860 Dehydration: Secondary | ICD-10-CM | POA: Diagnosis not present

## 2017-08-23 DIAGNOSIS — Z86010 Personal history of colonic polyps: Secondary | ICD-10-CM | POA: Diagnosis not present

## 2017-08-23 DIAGNOSIS — Z8673 Personal history of transient ischemic attack (TIA), and cerebral infarction without residual deficits: Secondary | ICD-10-CM | POA: Diagnosis not present

## 2017-08-23 DIAGNOSIS — R011 Cardiac murmur, unspecified: Secondary | ICD-10-CM | POA: Diagnosis not present

## 2017-08-23 DIAGNOSIS — K821 Hydrops of gallbladder: Secondary | ICD-10-CM | POA: Diagnosis present

## 2017-08-23 DIAGNOSIS — Z7984 Long term (current) use of oral hypoglycemic drugs: Secondary | ICD-10-CM | POA: Diagnosis not present

## 2017-08-23 DIAGNOSIS — E785 Hyperlipidemia, unspecified: Secondary | ICD-10-CM | POA: Diagnosis present

## 2017-08-23 DIAGNOSIS — K8064 Calculus of gallbladder and bile duct with chronic cholecystitis without obstruction: Secondary | ICD-10-CM | POA: Diagnosis present

## 2017-08-23 HISTORY — PX: CHOLECYSTECTOMY: SHX55

## 2017-08-23 LAB — GLUCOSE, CAPILLARY
GLUCOSE-CAPILLARY: 142 mg/dL — AB (ref 65–99)
GLUCOSE-CAPILLARY: 216 mg/dL — AB (ref 65–99)
GLUCOSE-CAPILLARY: 233 mg/dL — AB (ref 65–99)
GLUCOSE-CAPILLARY: 201 mg/dL — AB (ref 65–99)
Glucose-Capillary: 135 mg/dL — ABNORMAL HIGH (ref 65–99)
Glucose-Capillary: 136 mg/dL — ABNORMAL HIGH (ref 65–99)
Glucose-Capillary: 241 mg/dL — ABNORMAL HIGH (ref 65–99)

## 2017-08-23 LAB — COMPREHENSIVE METABOLIC PANEL
ALT: 21 U/L (ref 17–63)
ANION GAP: 8 (ref 5–15)
AST: 25 U/L (ref 15–41)
Albumin: 3.4 g/dL — ABNORMAL LOW (ref 3.5–5.0)
Alkaline Phosphatase: 79 U/L (ref 38–126)
BUN: 18 mg/dL (ref 6–20)
CALCIUM: 8.6 mg/dL — AB (ref 8.9–10.3)
CHLORIDE: 100 mmol/L — AB (ref 101–111)
CO2: 28 mmol/L (ref 22–32)
Creatinine, Ser: 1.05 mg/dL (ref 0.61–1.24)
GFR calc non Af Amer: >60 mL/min (ref >60)
Glucose, Bld: 136 mg/dL — ABNORMAL HIGH (ref 65–99)
Potassium: 3.1 mmol/L — ABNORMAL LOW (ref 3.5–5.1)
SODIUM: 136 mmol/L (ref 135–145)
Total Bilirubin: 1.8 mg/dL — ABNORMAL HIGH (ref 0.3–1.2)
Total Protein: 6.6 g/dL (ref 6.5–8.1)

## 2017-08-23 LAB — CBC
HCT: 39.3 % — ABNORMAL LOW (ref 40.0–52.0)
Hemoglobin: 13.3 g/dL (ref 13.0–18.0)
MCH: 31.3 pg (ref 26.0–34.0)
MCHC: 33.9 g/dL (ref 32.0–36.0)
MCV: 92.4 fL (ref 80.0–100.0)
PLATELETS: 162 10*3/uL (ref 150–440)
RBC: 4.25 MIL/uL — ABNORMAL LOW (ref 4.40–5.90)
RDW: 13 % (ref 11.5–14.5)
WBC: 6.7 10*3/uL (ref 3.8–10.6)

## 2017-08-23 LAB — ECHOCARDIOGRAM COMPLETE
Height: 68 in
WEIGHTICAEL: 3473.6 [oz_av]

## 2017-08-23 SURGERY — LAPAROSCOPIC CHOLECYSTECTOMY WITH INTRAOPERATIVE CHOLANGIOGRAM
Anesthesia: General | Site: Abdomen | Wound class: Clean Contaminated

## 2017-08-23 MED ORDER — ACETAMINOPHEN 650 MG RE SUPP
650.0000 mg | Freq: Four times a day (QID) | RECTAL | Status: DC | PRN
  Filled 2017-08-23: qty 1

## 2017-08-23 MED ORDER — PROPOFOL 10 MG/ML IV BOLUS
INTRAVENOUS | Status: DC | PRN
  Administered 2017-08-23: 150 mg via INTRAVENOUS

## 2017-08-23 MED ORDER — ACETAMINOPHEN 325 MG PO TABS: 650.0000 mg | ORAL_TABLET | Freq: Four times a day (QID) | ORAL | Status: DC | PRN

## 2017-08-23 MED ORDER — FENTANYL CITRATE (PF) 100 MCG/2ML IJ SOLN: 25.0000 ug | INTRAMUSCULAR | Status: DC | PRN

## 2017-08-23 MED ORDER — BUPIVACAINE HCL (PF) 0.5 % IJ SOLN
INTRAMUSCULAR | Status: AC
  Filled 2017-08-23: qty 30

## 2017-08-23 MED ORDER — FENTANYL CITRATE (PF) 100 MCG/2ML IJ SOLN
INTRAMUSCULAR | Status: DC | PRN
  Administered 2017-08-23 (×2): 50 ug via INTRAVENOUS
  Administered 2017-08-23 (×4): 25 ug via INTRAVENOUS
  Administered 2017-08-23: 50 ug via INTRAVENOUS

## 2017-08-23 MED ORDER — CHLORHEXIDINE GLUCONATE CLOTH 2 % EX PADS: 6.0000 | MEDICATED_PAD | Freq: Once | CUTANEOUS | Status: DC

## 2017-08-23 MED ORDER — LIDOCAINE HCL 1 % IJ SOLN
INTRAMUSCULAR | Status: DC | PRN
Start: 2017-08-23 — End: 2017-08-23
  Administered 2017-08-23: 20 mL

## 2017-08-23 MED ORDER — ONDANSETRON HCL 4 MG/2ML IJ SOLN
INTRAMUSCULAR | Status: DC | PRN
  Administered 2017-08-23: 4 mg via INTRAVENOUS

## 2017-08-23 MED ORDER — LACTATED RINGERS IV SOLN: INTRAVENOUS | Status: DC

## 2017-08-23 MED ORDER — ENOXAPARIN SODIUM 40 MG/0.4ML ~~LOC~~ SOLN
40.0000 mg | SUBCUTANEOUS | Status: DC
  Administered 2017-08-24: 40 mg via SUBCUTANEOUS
  Filled 2017-08-23: qty 0.4

## 2017-08-23 MED ORDER — SODIUM CHLORIDE 0.9 % IV SOLN
INTRAVENOUS | Status: DC | PRN
  Administered 2017-08-23: 12:00:00 via INTRAVENOUS

## 2017-08-23 MED ORDER — ROCURONIUM BROMIDE 100 MG/10ML IV SOLN
INTRAVENOUS | Status: DC | PRN
  Administered 2017-08-23 (×2): 20 mg via INTRAVENOUS
  Administered 2017-08-23: 50 mg via INTRAVENOUS
  Administered 2017-08-23: 10 mg via INTRAVENOUS

## 2017-08-23 MED ORDER — KCL IN DEXTROSE-NACL 20-5-0.45 MEQ/L-%-% IV SOLN
INTRAVENOUS | Status: DC
  Administered 2017-08-23 (×2): via INTRAVENOUS
  Filled 2017-08-23 (×4): qty 1000

## 2017-08-23 MED ORDER — LIDOCAINE HCL (CARDIAC) 20 MG/ML IV SOLN
INTRAVENOUS | Status: DC | PRN
  Administered 2017-08-23: 100 mg via INTRAVENOUS

## 2017-08-23 MED ORDER — ONDANSETRON HCL 4 MG/2ML IJ SOLN: 4.0000 mg | Freq: Once | INTRAMUSCULAR | Status: DC | PRN

## 2017-08-23 MED ORDER — SUGAMMADEX SODIUM 200 MG/2ML IV SOLN
INTRAVENOUS | Status: DC | PRN
  Administered 2017-08-23: 200 mg via INTRAVENOUS

## 2017-08-23 MED ORDER — POTASSIUM CHLORIDE CRYS ER 20 MEQ PO TBCR
40.0000 meq | EXTENDED_RELEASE_TABLET | Freq: Once | ORAL | Status: AC
  Administered 2017-08-23: 40 meq via ORAL
  Filled 2017-08-23: qty 2

## 2017-08-23 MED ORDER — LABETALOL HCL 5 MG/ML IV SOLN
INTRAVENOUS | Status: DC | PRN
  Administered 2017-08-23 (×2): 5 mg via INTRAVENOUS

## 2017-08-23 MED ORDER — LIDOCAINE HCL (PF) 1 % IJ SOLN
INTRAMUSCULAR | Status: AC
  Filled 2017-08-23: qty 30

## 2017-08-23 MED ORDER — MORPHINE SULFATE (PF) 2 MG/ML IV SOLN
2.0000 mg | INTRAVENOUS | Status: DC | PRN
  Administered 2017-08-24 (×2): 2 mg via INTRAVENOUS
  Filled 2017-08-23 (×3): qty 1

## 2017-08-23 MED ORDER — OXYCODONE-ACETAMINOPHEN 5-325 MG PO TABS
1.0000 | ORAL_TABLET | ORAL | Status: DC | PRN
  Administered 2017-08-23 – 2017-08-24 (×2): 2 via ORAL
  Filled 2017-08-23 (×2): qty 2

## 2017-08-23 MED ORDER — ACETAMINOPHEN 10 MG/ML IV SOLN
INTRAVENOUS | Status: DC | PRN
  Administered 2017-08-23: 1000 mg via INTRAVENOUS

## 2017-08-23 MED ORDER — DEXAMETHASONE SODIUM PHOSPHATE 10 MG/ML IJ SOLN
INTRAMUSCULAR | Status: DC | PRN
  Administered 2017-08-23: 10 mg via INTRAVENOUS

## 2017-08-23 SURGICAL SUPPLY — 37 items
APPLIER CLIP ROT 10 11.4 M/L (STAPLE) ×3
CATH REDDICK CHOLANGI 4FR 50CM (CATHETERS) ×3 IMPLANT
CHLORAPREP W/TINT 26ML (MISCELLANEOUS) ×3 IMPLANT
CLIP APPLIE ROT 10 11.4 M/L (STAPLE) ×1 IMPLANT
DECANTER SPIKE VIAL GLASS SM (MISCELLANEOUS) IMPLANT
DERMABOND ADVANCED (GAUZE/BANDAGES/DRESSINGS) ×2
DERMABOND ADVANCED .7 DNX12 (GAUZE/BANDAGES/DRESSINGS) ×1 IMPLANT
DRESSING SURGICEL FIBRLLR 1X2 (HEMOSTASIS) IMPLANT
DRSG SURGICEL FIBRILLAR 1X2 (HEMOSTASIS)
ELECT REM PT RETURN 9FT ADLT (ELECTROSURGICAL) ×3
ELECTRODE REM PT RTRN 9FT ADLT (ELECTROSURGICAL) ×1 IMPLANT
GLOVE BIO SURGEON STRL SZ7 (GLOVE) ×15 IMPLANT
GLOVE BIOGEL PI IND STRL 7.5 (GLOVE) ×3 IMPLANT
GLOVE BIOGEL PI INDICATOR 7.5 (GLOVE) ×6
GOWN STRL REUS W/ TWL LRG LVL3 (GOWN DISPOSABLE) ×2 IMPLANT
GOWN STRL REUS W/TWL LRG LVL3 (GOWN DISPOSABLE) ×4
GRASPER SUT TROCAR 14GX15 (MISCELLANEOUS) ×3 IMPLANT
HEMOSTAT ARISTA ABSORB 3G PWDR (MISCELLANEOUS) ×3 IMPLANT
IRRIGATION STRYKERFLOW (MISCELLANEOUS) ×1 IMPLANT
IRRIGATOR STRYKERFLOW (MISCELLANEOUS) ×3
IV NS 1000ML (IV SOLUTION) ×2
IV NS 1000ML BAXH (IV SOLUTION) ×1 IMPLANT
KIT RM TURNOVER STRD PROC AR (KITS) ×3 IMPLANT
NEEDLE HYPO 22GX1.5 SAFETY (NEEDLE) ×3 IMPLANT
NEEDLE INSUFFLATION 14GA 120MM (NEEDLE) ×3 IMPLANT
NS IRRIG 1000ML POUR BTL (IV SOLUTION) ×3 IMPLANT
PACK LAP CHOLECYSTECTOMY (MISCELLANEOUS) ×3 IMPLANT
POUCH ENDO CATCH 10MM SPEC (MISCELLANEOUS) ×3 IMPLANT
SCISSORS METZENBAUM CVD 33 (INSTRUMENTS) ×3 IMPLANT
SLEEVE ENDOPATH XCEL 5M (ENDOMECHANICALS) ×6 IMPLANT
SUT MNCRL AB 4-0 PS2 18 (SUTURE) ×3 IMPLANT
SUT VICRYL 0 UR6 27IN ABS (SUTURE) ×3 IMPLANT
SUT VICRYL AB 3-0 FS1 BRD 27IN (SUTURE) ×3 IMPLANT
SYR 20CC LL (SYRINGE) IMPLANT
TROCAR XCEL NON-BLD 11X100MML (ENDOMECHANICALS) ×3 IMPLANT
TROCAR XCEL NON-BLD 5MMX100MML (ENDOMECHANICALS) ×3 IMPLANT
TUBING INSUFFLATION (TUBING) ×3 IMPLANT

## 2017-08-23 NOTE — Transfer of Care (Signed)
Immediate Anesthesia Transfer of Care Note  Patient: Guy Phillips  Procedure(s) Performed: LAPAROSCOPIC CHOLECYSTECTOMY (N/A Abdomen)  Patient Location: PACU  Anesthesia Type:General  Level of Consciousness: sedated and responds to stimulation  Airway & Oxygen Therapy: Patient Spontanous Breathing and Patient connected to face mask oxygen  Post-op Assessment: Report given to RN and Post -op Vital signs reviewed and stable  Post vital signs: Reviewed and stable  Last Vitals:  Vitals:   08/23/17 1551 08/23/17 1552  BP: 140/79 140/79  Pulse: 81 80  Resp: 12 12  Temp: (!) 36.3 C   SpO2: 98% 99%    Last Pain:  Vitals:   08/23/17 0408  TempSrc: Oral  PainSc:          Complications: No apparent anesthesia complications

## 2017-08-23 NOTE — Anesthesia Post-op Follow-up Note (Deleted)
Anesthesia QCDR form completed.        

## 2017-08-23 NOTE — Progress Notes (Signed)
Ice water given to patient.  Patient continues to sweat, He states this is his normal and he is a hot natured Person.

## 2017-08-23 NOTE — Anesthesia Procedure Notes (Signed)
Procedure Name: Intubation Date/Time: 08/23/2017 12:04 PM Performed by: Doreen Salvage, CRNA Pre-anesthesia Checklist: Patient identified, Patient being monitored, Timeout performed, Emergency Drugs available and Suction available Patient Re-evaluated:Patient Re-evaluated prior to induction Oxygen Delivery Method: Circle system utilized Preoxygenation: Pre-oxygenation with 100% oxygen Induction Type: IV induction Ventilation: Mask ventilation without difficulty Laryngoscope Size: Mac and 3 Grade View: Grade I Tube type: Oral Tube size: 7.5 mm Number of attempts: 1 Airway Equipment and Method: Stylet Placement Confirmation: ETT inserted through vocal cords under direct vision,  positive ETCO2 and breath sounds checked- equal and bilateral Secured at: 23 cm Tube secured with: Tape Dental Injury: Teeth and Oropharynx as per pre-operative assessment

## 2017-08-23 NOTE — Progress Notes (Signed)
Guy Phillips: Guy Phillips    MR#:  [id number]  DATE OF BIRTH:  [birth year]  SUBJECTIVE:  CHIEF COMPLAINT:   Chief Complaint  Patient presents with  . Abdominal Pain    RUQ   The patient has no complaints. REVIEW OF SYSTEMS:  Review of Systems  Constitutional: Negative for chills, fever and malaise/fatigue.  HENT: Negative for sore throat.   Eyes: Negative for blurred vision and double vision.  Respiratory: Negative for cough, hemoptysis, shortness of breath, wheezing and stridor.   Cardiovascular: Negative for chest pain, palpitations, orthopnea and leg swelling.  Gastrointestinal: Negative for abdominal pain, blood in stool, diarrhea, melena, nausea and vomiting.  Genitourinary: Negative for dysuria, flank pain and hematuria.  Musculoskeletal: Negative for back pain and joint pain.  Neurological: Negative for dizziness, sensory change, focal weakness, seizures, loss of consciousness, weakness and headaches.  Endo/Heme/Allergies: Negative for polydipsia.  Psychiatric/Behavioral: Negative for depression. The patient is not nervous/anxious.     DRUG ALLERGIES:   Allergies  Allergen Reactions  . Grapefruit Extract Other (See Comments)    Mouth sores  . Other Other (See Comments)   VITALS:  Blood pressure 114/70, pulse 87, temperature 97.6 F (36.4 C), temperature source Oral, resp. rate 16, height 5\' 8"  (1.727 m), weight 217 lb 1.6 oz (98.5 kg), SpO2 96 %. PHYSICAL EXAMINATION:  Physical Exam  Constitutional: He is oriented to person, place, and time and well-developed, well-nourished, and in no distress.  HENT:  Head: Normocephalic.  Mouth/Throat: Oropharynx is clear and moist.  Eyes: Conjunctivae and EOM are normal. Pupils are equal, round, and reactive to light. No scleral icterus.  Neck: Normal range of motion. Neck supple. No JVD present. No tracheal deviation present.  Cardiovascular: Normal rate,  regular rhythm and normal heart sounds. Exam reveals no gallop.  No murmur heard. Pulmonary/Chest: Effort normal and breath sounds normal. No respiratory distress. He has no wheezes. He has no rales.  Abdominal: Soft. Bowel sounds are normal. He exhibits no distension. There is no tenderness. There is no rebound.  Musculoskeletal: Normal range of motion. He exhibits no edema or tenderness.  Neurological: He is alert and oriented to person, place, and time. No cranial nerve deficit.  Skin: No rash noted. No erythema.  Psychiatric: Affect normal.   LABORATORY PANEL:  Male CBC Recent Labs  Lab 08/23/17 0417  WBC 6.7  HGB 13.3  HCT 39.3*  PLT 162   ------------------------------------------------------------------------------------------------------------------ Chemistries  Recent Labs  Lab 08/22/17 0630 08/23/17 0417  NA 134* 136  K 3.5 3.1*  CL 99* 100*  CO2 22 28  GLUCOSE 199* 136*  BUN 23* 18  CREATININE 1.02 1.05  CALCIUM 8.7* 8.6*  MG 1.7  --   AST 17 25  ALT 15* 21  ALKPHOS 74 79  BILITOT 2.5* 1.8*   RADIOLOGY:  No results found. ASSESSMENT AND PLAN:   Acute cholecystitis with leukocytosis. Low to modium risk for cholecystectomy.  Cholecystectomy today. Hold ASA, continue lopressor. Continue abx and follow up CBC.  Dehydration.  Improved with IV fluid support.  Hypokalemia.  Give potassium supplement and follow-up level.  CAD, s/p CABG, EKG: LBBB. First degree AV block. Dr. Ubaldo Glassing agrees to surgery. HTN. Continue lopressor. DM, sliding scale. History of CVA. Hold ASA for surgery.  Discussed with Dr. Rosana Hoes and Dr. Ubaldo Glassing.  All the records are reviewed and case discussed with Care Management/Social Worker. Management plans discussed with the patient, his  wife and they are in agreement.  CODE STATUS: Full Code  TOTAL TIME TAKING CARE OF THIS PATIENT: 28 minutes.   More than 50% of the time was spent in counseling/coordination of care: YES  POSSIBLE  D/C IN 1 DAYS, DEPENDING ON CLINICAL CONDITION.   Demetrios Loll M.D on 08/23/2017 at 1:30 PM  Between 7am to 6pm - Pager - [phone]  After 6pm go to [email or web address] - [facility]

## 2017-08-23 NOTE — Anesthesia Postprocedure Evaluation (Signed)
Anesthesia Post Note  Patient: ANES RIGEL  Procedure(s) Performed: LAPAROSCOPIC CHOLECYSTECTOMY (N/A Abdomen)  Patient location during evaluation: PACU Anesthesia Type: General Level of consciousness: awake and alert Pain management: pain level controlled Vital Signs Assessment: post-procedure vital signs reviewed and stable Respiratory status: spontaneous breathing, nonlabored ventilation, respiratory function stable and patient connected to nasal cannula oxygen Cardiovascular status: blood pressure returned to baseline and stable Postop Assessment: no apparent nausea or vomiting Anesthetic complications: no     Last Vitals:  Vitals:   08/23/17 1636 08/23/17 1651  BP: 134/73 (!) 143/72  Pulse: 72 74  Resp: 14 16  Temp:  36.4 C  SpO2: 96% 94%    Last Pain:  Vitals:   08/23/17 1651  TempSrc: Oral  PainSc:                  Martha Clan

## 2017-08-23 NOTE — Op Note (Signed)
SURGICAL OPERATIVE REPORT   DATE OF PROCEDURE: 08/23/2017  ATTENDING Surgeon(s): Vickie Epley, MD  ANESTHESIA: GETA  PRE-OPERATIVE DIAGNOSIS: Acute cholecystitis with likely passage of biliary sludge/microcholelithiasis (K80.00)  POST-OPERATIVE DIAGNOSIS: Severe gangrenous chronic cholecystitis with gallbladder hydrops and likely passage of biliary sludge/microcholelithiasis (K81.1)  PROCEDURE(S): (cpt's: 56812) 1.) Laparoscopic Cholecystectomy  INTRAOPERATIVE FINDINGS: Severe gangrenous chronic cholecystitis with gallbladder hydrops, densely fibrotic pericholecystic inflammation, and a challenging cholecystectomy completed laparoscopically and safely, ultimately with clear visualization of cystic duct and cystic artery and with visual confirmation of clips well-secured to cystic duct and cystic artery  INTRAOPERATIVE FLUIDS: 700 mL crystalloid   ESTIMATED BLOOD LOSS: Minimal (<30 mL)   URINE OUTPUT: No foley  SPECIMENS: Gallbladder  IMPLANTS: None  DRAINS: None   COMPLICATIONS: None apparent   CONDITION AT COMPLETION: Hemodynamically stable and extubated  DISPOSITION: PACU   INDICATION(S) FOR PROCEDURE:  Patient is a 76 y.o. male with DM, CAD s/p MI/CABG/AVR, and stroke, but excellent exercise regimen and tolerance who presented to [facility] ED this admission for first and only episode of acute onset RUQ > epigastric abdominal pain. Total bilirubin at time of admission was 1.9, while ultrasound and CT suggested acalculous cholecystitis, which was confirmed on HIDA nuclear medicine imaging. Review of patient's ultrasound does appear to visualize some dependent biliary sludge (microcholelithiasis), though no stones. Patient was made NPO and admitted with IV antibiotics. Due to somewhat improved symptoms and increased total bilirubin to 2.5, total bilirubin was rechecked the following morning and found to have decreased to 1.7. All risks, benefits, and alternatives to  cholecystectomy with possible intra-operative cholangiogram were discussed with the patient and his family, patient elected to proceed, and informed consent was accordingly obtained at that time.  DETAILS OF PROCEDURE:  Patient was brought to the operating suite and appropriately identified. General anesthesia was administered along with confirmation that patient received his ongoing therapeutic IV antibiotics, and endotracheal intubation was performed by anesthesiologist, along with NG/OG tube for gastric decompression. In supine position, operative site was prepped and draped in the usual sterile fashion, and following a brief time out, an initial 5 mm incision was made in a natural skin crease several cm above patient's umbilicus due to his prior umbilical hernia repair with mesh. Fascia was then elevated, and a Verress needle was inserted and its proper position confirmed using aspiration and saline meniscus test.  Upon insufflation of the abdominal cavity with carbon dioxide to a well-tolerated pressure of 12-15 mmHg, a 5 mm peri-umbilical port followed by a laparoscope were inserted and used to inspect the abdominal cavity and its contents with no injuries from insertion of the first trochar noted. Three additional trocars were inserted, one at the epigastric position (10 mm) and two along the Right costal margin (5 mm). The table was then placed in reverse Trendelenburg position with patient's Right side up. Patient's gallbladder was initially unable to be visualized due to severe dense fibrous encasement by omentum and surrounding structures. Persistent and careful meticulous lysis of this dense inflammatory tissue encasing what was eventually found to be patient's gallbladder using combined blunt dissection and selective electrocautery x 90 minutes ultimately made visualization of patient's gallbladder possible. The gallbladder was then needle-decompressed with visualization of gallbladder  hydrops.  Eventually, the apex of the gallbladder was grasped with an atraumatic grasper and retracted over the patient's liver, and dissection was continued as described above along the length of patient's gallbladder until the infundibulum was able to be grasped and retracted, exposing  Calot's triangle, and the peritoneum overlying the gallbladder infundibulum was incised and dissected free of surrounding peritoneal attachments, revealing the cystic duct and cystic artery. However, not enough length of the patient's cystic duct was able to be dissected free from surrounding tissues to make performing a cholangiogram safe for the purpose of flushing anticipated biliary sludge through the patient's CBD. Upon ensuring enough length of patient's now clearly visualized cystic duct as it connected with patient's gallbladder, the cystic duct was clipped twice on the patient side and once on the gallbladder specimen side close to the gallbladder. The cystic duct was then sharply divided, revealing the cystic artery, which was likewise clipped twice on the patient side and once on the gallbladder specimen side. Careful further dissection was performed at this level until the gallbladder was free from adjacent structures.   The edematous and gangrenous, woody fibrotic gallbladder was then essentially peeled off of the gallbladder fossa with only occasional assistance using electrocautery. The gallbladder fossa of the liver bed was then inspected and found to be hemostatic despite a somewhat raw surface, and the gallbladder specimen was placed into a laparoscopic specimen bag and removed from the abdominal cavity via the epigastric port site. Hemostasis and secure placement of clips were again confirmed, and Arista hemostatic powder was applied to the raw surface of the gallbladder fossa of the liver bed to help ensure reliable hemostasis. The intra-peritoneal cavity was once more inspected with no additional findings.  PMI laparoscopic fascial closure device was then used to re-approximate fascia at the 10 mm epigastric port site. All ports were then removed under direct visualization, and abdominal cavity was desuflated. All port sites were irrigated/cleaned, additional local anesthetic was injected at each incision, 3-0 Vicryl was used to re-approximate dermis at 10 mm port site(s), and subcuticular 4-0 Monocryl suture was used to re-approximate skin. Skin was then cleaned, dried, and sterile skin glue was applied. Patient was then safely able to be awakened, extubated, and transferred to PACU for post-operative monitoring and care.   I was present for all aspects of procedure, and there were no intra-operative complications apparent.

## 2017-08-23 NOTE — Progress Notes (Addendum)
Patient is schedule to go to the OR for a lap chole today at 1230. Dr. Bridgett Larsson ordered one time dose of oral potassium chloride 40 mEq. This RN confirmed with Dr. Bridgett Larsson and he would like to proceed with administration.

## 2017-08-23 NOTE — Anesthesia Post-op Follow-up Note (Signed)
Anesthesia QCDR form completed.        

## 2017-08-23 NOTE — Progress Notes (Signed)
       [facility]  SUBJECTIVE: No cardiac complaints. No chest pain or sob. Mild ruq discomfort   Vitals:   08/22/17 0949 08/22/17 1158 08/22/17 1954 08/23/17 0408  BP: 116/73 (!) 121/59 134/75 114/70  Pulse: (!) 105 89 89 87  Resp:   18 16  Temp:  98.9 F (37.2 C) 98.1 F (36.7 C) 97.6 F (36.4 C)  TempSrc:  Oral Oral Oral  SpO2:  95% 97% 96%  Weight:      Height:        Intake/Output Summary (Last 24 hours) at 08/23/2017 0906 Last data filed at 08/23/2017 0524 Gross per 24 hour  Intake 4166.76 ml  Output 670 ml  Net 3496.76 ml    LABS: Basic Metabolic Panel: Recent Labs    08/22/17 0630 08/23/17 0417  NA 134* 136  K 3.5 3.1*  CL 99* 100*  CO2 22 28  GLUCOSE 199* 136*  BUN 23* 18  CREATININE 1.02 1.05  CALCIUM 8.7* 8.6*  MG 1.7  --    Liver Function Tests: Recent Labs    08/22/17 0630 08/23/17 0417  AST 17 25  ALT 15* 21  ALKPHOS 74 79  BILITOT 2.5* 1.8*  PROT 6.8 6.6  ALBUMIN 3.4* 3.4*   Recent Labs    08/21/17 1048  LIPASE 28   CBC: Recent Labs    08/22/17 0630 08/23/17 0417  WBC 14.3* 6.7  HGB 13.9 13.3  HCT 41.2 39.3*  MCV 92.6 92.4  PLT 179 162   Cardiac Enzymes: Recent Labs    08/21/17 1048 08/21/17 1814  TROPONINI 0.03* <0.03   BNP: Invalid input(s): POCBNP D-Dimer: No results for input(s): DDIMER in the last 72 hours. Hemoglobin A1C: No results for input(s): HGBA1C in the last 72 hours. Fasting Lipid Panel: No results for input(s): CHOL, HDL, LDLCALC, TRIG, CHOLHDL, LDLDIRECT in the last 72 hours. Thyroid Function Tests: No results for input(s): TSH, T4TOTAL, T3FREE, THYROIDAB in the last 72 hours.  Invalid input(s): FREET3 Anemia Panel: No results for input(s): VITAMINB12, FOLATE, FERRITIN, TIBC, IRON, RETICCTPCT in the last 72 hours.   Physical Exam: Blood pressure 114/70, pulse 87, temperature 97.6 F (36.4 C), temperature source Oral, resp. rate 16, height 5\' 8"   (1.727 m), weight 98.5 kg (217 lb 1.6 oz), SpO2 96 %.   Wt Readings from Last 1 Encounters:  08/21/17 98.5 kg (217 lb 1.6 oz)     General appearance: alert and cooperative Resp: clear to auscultation bilaterally Cardio: regular rate and rhythm GI: abnormal findings:  mild tenderness in the RUQ Extremities: extremities normal, atraumatic, no cyanosis or edema Neurologic: Grossly normal    ASSESSMENT AND PLAN:  Active Problems:   Acute cholecystitis-per gen surgery. Low to moderate risk for surgery from cardiac standpoint. Optimized at present . Would proceed with sbe prophylaxis. No further cardiac workup indicated at present  CAD- s/p cabg in 2015-No ischemia with vigourous outpatient activity. OK to proced with surgery.  Aortic valve disease-s/p avr with bovine tissue prosthesis. Functioning normally by echo. Would use sbe prophylaxis.     Teodoro Spray, MD, [facility] 08/23/2017 9:06 AM

## 2017-08-23 NOTE — Progress Notes (Signed)
15 minute call to floor. 

## 2017-08-24 ENCOUNTER — Encounter: Payer: Self-pay | Admitting: Surgery

## 2017-08-24 LAB — COMPREHENSIVE METABOLIC PANEL
ALBUMIN: 3 g/dL — AB (ref 3.5–5.0)
ALK PHOS: 233 U/L — AB (ref 38–126)
ALT: 154 U/L — ABNORMAL HIGH (ref 17–63)
ANION GAP: 10 (ref 5–15)
AST: 273 U/L — ABNORMAL HIGH (ref 15–41)
BILIRUBIN TOTAL: 1.9 mg/dL — AB (ref 0.3–1.2)
BUN: 16 mg/dL (ref 6–20)
CALCIUM: 8 mg/dL — AB (ref 8.9–10.3)
CO2: 24 mmol/L (ref 22–32)
Chloride: 100 mmol/L — ABNORMAL LOW (ref 101–111)
Creatinine, Ser: 0.85 mg/dL (ref 0.61–1.24)
GFR calc non Af Amer: >60 mL/min (ref >60)
GLUCOSE: 231 mg/dL — AB (ref 65–99)
POTASSIUM: 3.9 mmol/L (ref 3.5–5.1)
Sodium: 134 mmol/L — ABNORMAL LOW (ref 135–145)
TOTAL PROTEIN: 6.2 g/dL — AB (ref 6.5–8.1)

## 2017-08-24 LAB — CBC
HEMATOCRIT: 37.2 % — AB (ref 40.0–52.0)
Hemoglobin: 12.8 g/dL — ABNORMAL LOW (ref 13.0–18.0)
MCH: 31.6 pg (ref 26.0–34.0)
MCHC: 34.3 g/dL (ref 32.0–36.0)
MCV: 92.2 fL (ref 80.0–100.0)
Platelets: 187 10*3/uL (ref 150–440)
RBC: 4.04 MIL/uL — ABNORMAL LOW (ref 4.40–5.90)
RDW: 13.2 % (ref 11.5–14.5)
WBC: 9.1 10*3/uL (ref 3.8–10.6)

## 2017-08-24 LAB — GLUCOSE, CAPILLARY
Glucose-Capillary: 227 mg/dL — ABNORMAL HIGH (ref 65–99)
Glucose-Capillary: 253 mg/dL — ABNORMAL HIGH (ref 65–99)

## 2017-08-24 MED ORDER — METOPROLOL TARTRATE 25 MG PO TABS
12.5000 mg | ORAL_TABLET | Freq: Two times a day (BID) | ORAL | Status: DC
  Administered 2017-08-24: 12.5 mg via ORAL
  Filled 2017-08-24: qty 1

## 2017-08-24 MED ORDER — METOPROLOL TARTRATE 25 MG PO TABS: 25.0000 mg | ORAL_TABLET | Freq: Every day | ORAL | Status: DC

## 2017-08-24 MED ORDER — INSULIN GLARGINE 100 UNIT/ML ~~LOC~~ SOLN
10.0000 [IU] | Freq: Every day | SUBCUTANEOUS | Status: DC
Start: 2017-08-24 — End: 2017-08-24
  Filled 2017-08-24: qty 0.1

## 2017-08-24 MED ORDER — OXYCODONE-ACETAMINOPHEN 5-325 MG PO TABS: 1.0000 | ORAL_TABLET | ORAL | 0 refills | Status: DC | PRN

## 2017-08-24 NOTE — Discharge Instructions (Signed)
In addition to included general post-operative instructions for Laparoscopic Cholecystectomy,  Diet: Gradually resume home heart healthy diet.   Activity: No heavy lifting >20 pounds (children, pets, laundry, garbage) or strenuous activity until follow-up, but light activity and walking are encouraged. Do not drive or drink alcohol if taking narcotic pain medications.  Wound care: 2 days after surgery (Monday, 12/3), may shower/get incision wet with soapy water and pat dry (do not rub incisions), but no baths or submerging incision underwater until follow-up.   Medications: Resume all home medications. For mild to moderate pain: acetaminophen (Tylenol) or ibuprofen/naproxen (if no kidney disease). Combining Tylenol with alcohol can substantially increase your risk of causing liver disease. Narcotic pain medications, if prescribed, can be used for severe pain, though may cause nausea, constipation, and drowsiness. Do not combine Tylenol and Percocet (or similar) within a 6 hour period as Percocet (and similar) contain(s) Tylenol. If you do not need the narcotic pain medication, you do not need to fill the prescription.  Call office [phone]) at any time if any questions, worsening pain, fevers/chills, bleeding, drainage from incision site, or other concerns.

## 2017-08-24 NOTE — Care Management Note (Signed)
Case Management Note  Patient Details  Name: Guy Phillips MRN: [id number] Date of Birth: 06-27-1941  Subjective/Objective:   Discharged to home. No home health services                 Action/Plan:   Expected Discharge Date:  08/24/17               Expected Discharge Plan:     In-House Referral:     Discharge planning Services     Post Acute Care Choice:    Choice offered to:     DME Arranged:    DME Agency:     HH Arranged:    HH Agency:     Status of Service:     If discussed at [employer], dates discussed:    Additional Comments:  Martine Trageser A, RN 08/24/2017, 9:07 AM

## 2017-08-24 NOTE — Progress Notes (Signed)
Dr. Rosana Hoes rounded earlier in the shift and has ordered discharge home. Patient has been up to the chair and has ambulated in the hallway twice. Mr. Guy Phillips was allowed some time to rest after stating that he did not sleep at all last night. Discharge instructions, prescriptions and instructions for follow up appointment was reviewed with patient and his wife. Questions were encouraged. Will call for wheelchair when he is dressed and ready to leave.

## 2017-08-24 NOTE — Progress Notes (Signed)
[facility] [place] NAME: Markelle Najarian    MR#:  [id number]  DATE OF BIRTH:  06-20-1941  SUBJECTIVE:  CHIEF COMPLAINT:   Chief Complaint  Patient presents with  . Abdominal Pain    RUQ   The patient had lower abd pain this am, resolved. REVIEW OF SYSTEMS:  Review of Systems  Constitutional: Negative for chills, fever and malaise/fatigue.  HENT: Negative for sore throat.   Eyes: Negative for blurred vision and double vision.  Respiratory: Negative for cough, hemoptysis, shortness of breath, wheezing and stridor.   Cardiovascular: Negative for chest pain, palpitations, orthopnea and leg swelling.  Gastrointestinal: Positive for abdominal pain. Negative for blood in stool, diarrhea, melena, nausea and vomiting.  Genitourinary: Negative for dysuria, flank pain and hematuria.  Musculoskeletal: Negative for back pain and joint pain.  Neurological: Negative for dizziness, sensory change, focal weakness, seizures, loss of consciousness, weakness and headaches.  Endo/Heme/Allergies: Negative for polydipsia.  Psychiatric/Behavioral: Negative for depression. The patient is not nervous/anxious.     DRUG ALLERGIES:   Allergies  Allergen Reactions  . Grapefruit Extract Other (See Comments)    Mouth sores  . Other Other (See Comments)   VITALS:  Blood pressure (!) 177/81, pulse 91, temperature 97.7 F (36.5 C), temperature source Oral, resp. rate 16, height 5\' 8"  (1.727 m), weight 217 lb 1.6 oz (98.5 kg), SpO2 97 %. PHYSICAL EXAMINATION:  Physical Exam  Constitutional: He is oriented to person, place, and time and well-developed, well-nourished, and in no distress.  HENT:  Head: Normocephalic.  Mouth/Throat: Oropharynx is clear and moist.  Eyes: Conjunctivae and EOM are normal. Pupils are equal, round, and reactive to light. No scleral icterus.  Neck: Normal range of motion. Neck supple. No JVD present. No tracheal deviation  present.  Cardiovascular: Normal rate, regular rhythm and normal heart sounds. Exam reveals no gallop.  No murmur heard. Pulmonary/Chest: Effort normal and breath sounds normal. No respiratory distress. He has no wheezes. He has no rales.  Abdominal: Soft. Bowel sounds are normal. He exhibits no distension. There is no tenderness. There is no rebound.  Musculoskeletal: Normal range of motion. He exhibits no edema or tenderness.  Neurological: He is alert and oriented to person, place, and time. No cranial nerve deficit.  Skin: No rash noted. No erythema.  Psychiatric: Affect normal.   LABORATORY PANEL:  Male CBC Recent Labs  Lab 08/24/17 0326  WBC 9.1  HGB 12.8*  HCT 37.2*  PLT 187   ------------------------------------------------------------------------------------------------------------------ Chemistries  Recent Labs  Lab 08/22/17 0630  08/24/17 0326  NA 134*   < > 134*  K 3.5   < > 3.9  CL 99*   < > 100*  CO2 22   < > 24  GLUCOSE 199*   < > 231*  BUN 23*   < > 16  CREATININE 1.02   < > 0.85  CALCIUM 8.7*   < > 8.0*  MG 1.7  --   --   AST 17   < > 273*  ALT 15*   < > 154*  ALKPHOS 74   < > 233*  BILITOT 2.5*   < > 1.9*   < > = values in this interval not displayed.   RADIOLOGY:  No results found. ASSESSMENT AND PLAN:   Acute cholecystitis with leukocytosis. s/p Cholecystectomy. resume ASA, continue lopressor.  Dehydration.  Improved with IV fluid support. Abnormal LFT. Due to surgery. F/u LFT as  outpatient.  Hypokalemia.  Give potassium supplement and improved.  CAD, s/p CABG, EKG: LBBB. First degree AV block. Dr. Ubaldo Glassing agreed to surgery.  HTN. Continue lopressor. DM, sliding scale. BS 253, add lantus 10 unit daily. History of CVA. Resume  ASA.  Discussed with Dr. Rosana Hoes. Medically stable, sign off.  All the records are reviewed and case discussed with Care Management/Social Worker. Management plans discussed with the patient, his wife and they are  in agreement.  CODE STATUS: Full Code  TOTAL TIME TAKING CARE OF THIS PATIENT: 25 minutes.   More than 50% of the time was spent in counseling/coordination of care: YES  POSSIBLE D/C IN TODAY, DEPENDING ON CLINICAL CONDITION.   Demetrios Loll M.D on 08/24/2017 at 8:47 AM  Between 7am to 6pm - Pager - [phone]  After 6pm go to [email or web address] - [facility]

## 2017-08-25 ENCOUNTER — Other Ambulatory Visit: Payer: Self-pay

## 2017-08-25 ENCOUNTER — Telehealth: Payer: Self-pay

## 2017-08-25 DIAGNOSIS — K810 Acute cholecystitis: Secondary | ICD-10-CM

## 2017-08-25 NOTE — Telephone Encounter (Signed)
Call made to patient at this time. Advised patient that his lab order has been placed and that he can have it drawn at his earliest convince, prior to his appointment on 12/7 with Dr. Rosana Hoes. Patient advised to get blood work completed at [facility]. Patient verbalized understanding at this time.

## 2017-08-26 ENCOUNTER — Ambulatory Visit: Payer: Self-pay | Admitting: *Deleted

## 2017-08-26 NOTE — Telephone Encounter (Signed)
Potentially could be related to the stress of his recent surgery.  They should monitor and if it is not coming down consider increasing his glipizide or adding another oral medication.  Please have him check it later today and contact us with the result.  He certainly needs follow-up for his diabetes.

## 2017-08-26 NOTE — Telephone Encounter (Signed)
Patient took CBG about 15 minutes ago down 324 from 400, patient has not been really eating much or drinking much patient had bowl of low carb cereal.  Patient refused UC, and no appointments available Gall bladder surgery.  Patient was released Sunday from [facility] last CBG reported by patient there was 217. Seeing surgeon on Friday morning and labs have been ordered by surgeon for same day. Patient ask if this could be coming from surgery and trauma.

## 2017-08-26 NOTE — Telephone Encounter (Signed)
Patient rechecked CBG while on Phone which was 215.  TCM completed omn patient need place to for patient on schedule for HFU.   Transition Care Management Follow-up Telephone Call  How have you been since you were released from the hospital? Doing better except for CBG's increasing .   Do you understand why you were in the hospital? yes   Do you understand the discharge instrcutions? yes  Items Reviewed:  Medications reviewed: yes  Allergies reviewed: yes  Dietary changes reviewed: yes  Referrals reviewed: yes   Functional Questionnaire:   Activities of Daily Living (ADLs):   He states they are independent in the following: ambulation, bathing and hygiene, feeding, continence, grooming, toileting and dressing States they require assistance with the following:No assistance required.   Any transportation issues/concerns?: no   Any patient concerns? Yes Just elevated CBG's   Confirmed importance and date/time of follow-up visits scheduled: yes   Confirmed with patient if condition begins to worsen call PCP or go to the ER.  Patient was given the Call-a-Nurse line [phone]: yes

## 2017-08-26 NOTE — Telephone Encounter (Signed)
Noted. Much improved. Can discuss further at hospital follow-up.

## 2017-08-26 NOTE — Telephone Encounter (Signed)
Patient has elevated glucose level today at 400. Patient's wife is concerned about the high level- patient is not eating much at this time.  Reason for Disposition . Blood glucose > 400 mg/dl (22 mmol/l)  Answer Assessment - Initial Assessment Questions 1. BLOOD GLUCOSE: "What is your blood glucose level?"      364 2. ONSET: "When did you check the blood glucose?"     10:45 3. USUAL RANGE: "What is your glucose level usually?" (e.g., usual fasting morning value, usual evening value)     In hospital ranged 217 and he was given insulin to lower it 4. KETONES: "Do you check for ketones (urine or blood test strips)?" If yes, ask: "What does the test show now?"      no 5. TYPE 1 or 2:  "Do you know what type of diabetes you have?"  (e.g., Type 1, Type 2, Gestational; doesn't know)      Type 2 6. INSULIN: "Do you take insulin?" If yes, ask: "Have you missed any shots recently?"     no 7. DIABETES PILLS: "Do you take any pills for your diabetes?" If yes, ask: "Have you missed taking any pills recently?"     Metformin and glypizide 8. OTHER SYMPTOMS: "Do you have any symptoms?" (e.g., fever, frequent urination, difficulty breathing, dizziness, weakness, vomiting)     sweating 9. PREGNANCY: "Is there any chance you are pregnant?" "When was your last menstrual period?"     n/a  Protocols used: DIABETES - HIGH BLOOD SUGAR-A-AH

## 2017-08-27 ENCOUNTER — Other Ambulatory Visit
Admission: RE | Admit: 2017-08-27 | Discharge: 2017-08-27 | Disposition: A | Discharge status: Home / Self Care | Payer: Medicare Other | Source: Ambulatory Visit | Attending: Surgery | Admitting: Surgery

## 2017-08-27 DIAGNOSIS — K810 Acute cholecystitis: Secondary | ICD-10-CM | POA: Insufficient documentation

## 2017-08-27 LAB — COMPREHENSIVE METABOLIC PANEL
ALT: 96 U/L — AB (ref 17–63)
AST: 85 U/L — AB (ref 15–41)
Albumin: 3.1 g/dL — ABNORMAL LOW (ref 3.5–5.0)
Alkaline Phosphatase: 158 U/L — ABNORMAL HIGH (ref 38–126)
Anion gap: 12 (ref 5–15)
BILIRUBIN TOTAL: 0.7 mg/dL (ref 0.3–1.2)
BUN: 22 mg/dL — AB (ref 6–20)
CHLORIDE: 98 mmol/L — AB (ref 101–111)
CO2: 27 mmol/L (ref 22–32)
CREATININE: 1.08 mg/dL (ref 0.61–1.24)
Calcium: 9.1 mg/dL (ref 8.9–10.3)
GFR calc Af Amer: >60 mL/min (ref >60)
Glucose, Bld: 85 mg/dL (ref 65–99)
Potassium: 3.6 mmol/L (ref 3.5–5.1)
Sodium: 137 mmol/L (ref 135–145)
Total Protein: 7.3 g/dL (ref 6.5–8.1)

## 2017-08-27 LAB — SURGICAL PATHOLOGY

## 2017-08-29 ENCOUNTER — Ambulatory Visit (INDEPENDENT_AMBULATORY_CARE_PROVIDER_SITE_OTHER): Payer: Medicare Other | Admitting: Surgery

## 2017-08-29 ENCOUNTER — Encounter: Payer: Self-pay | Admitting: Surgery

## 2017-08-29 VITALS — BP 127/79 | HR 84 | Temp 97.4°F | Ht 68.0 in | Wt 213.4 lb

## 2017-08-29 DIAGNOSIS — K810 Acute cholecystitis: Secondary | ICD-10-CM

## 2017-08-29 NOTE — Progress Notes (Signed)
Surgical Clinic Progress/Follow-up Note   HPI:  76 y.o. Male presents to clinic for post-op follow-up evaluation s/p laparoscopic cholecystectomy for severe gangrenous cholecystitis. Patient reports complete resolution of pre-operative abdominal pain and even says Right upper back pain he's been experiencing several weeks - months also seems to have resolved as well. He has been otherwise been tolerating regular diet with +flatus and normal BM's, denies N/V, fever/chills, CP, or SOB.  Review of Systems:  Constitutional: denies any other weight loss, fever, chills, or sweats  Eyes: denies any other vision changes, history of eye injury  ENT: denies sore throat, hearing problems  Respiratory: denies shortness of breath, wheezing  Cardiovascular: denies chest pain, palpitations  Gastrointestinal: abdominal pain, N/V, and bowel function as per interval history Musculoskeletal: denies any other joint pains or cramps  Skin: Denies any other rashes or skin discolorations  Neurological: denies any other headache, dizziness, weakness  Psychiatric: denies any other depression, anxiety  All other review of systems: otherwise negative   Vital Signs:  BP 127/79   Pulse 84   Temp (!) 97.4 F (36.3 C) (Oral)   Ht 5\' 8"  (1.727 m)   Wt 213 lb 6.4 oz (96.8 kg)   BMI 32.45 kg/m    Physical Exam:  Constitutional:  -- Overweight body habitus  -- Awake, alert, and oriented x3  Eyes:  -- Pupils equally round and reactive to light  -- No scleral icterus  Ear, nose, throat:  -- No jugular venous distension  -- No nasal drainage, bleeding Pulmonary:  -- No crackles -- Equal breath sounds bilaterally -- Breathing non-labored at rest Cardiovascular:  -- S1, S2 present  -- No pericardial rubs  Gastrointestinal:  -- Soft, nontender, non-distended, no guarding/rebound -- Incisions well-approximated without any surrounding erythema or drainage -- No abdominal masses appreciated, pulsatile or  otherwise  Musculoskeletal / Integumentary:  -- Wounds or skin discoloration: None appreciated except post-surgical wounds as described above (GI) -- Extremities: B/L UE and LE FROM, hands and feet warm, no edema  Neurologic:  -- Motor function: intact and symmetric  -- Sensation: intact and symmetric   Laboratory studies:  CBC    Component Value Date/Time   WBC 9.1 08/24/2017 0326   RBC 4.04 (L) 08/24/2017 0326   HGB 12.8 (L) 08/24/2017 0326   HCT 37.2 (L) 08/24/2017 0326   PLT 187 08/24/2017 0326   MCV 92.2 08/24/2017 0326   MCH 31.6 08/24/2017 0326   MCHC 34.3 08/24/2017 0326   RDW 13.2 08/24/2017 0326   LYMPHSABS 1.7 07/03/2011 1014   MONOABS 0.6 07/03/2011 1014   EOSABS 0.6 07/03/2011 1014   BASOSABS 0.0 07/03/2011 1014   CMP Latest Ref Rng & Units 08/27/2017 08/24/2017 08/23/2017  Glucose 65 - 99 mg/dL 85 231(H) 136(H)  BUN 6 - 20 mg/dL 22(H) 16 18  Creatinine 0.61 - 1.24 mg/dL 1.08 0.85 1.05  Sodium 135 - 145 mmol/L 137 134(L) 136  Potassium 3.5 - 5.1 mmol/L 3.6 3.9 3.1(L)  Chloride 101 - 111 mmol/L 98(L) 100(L) 100(L)  CO2 22 - 32 mmol/L 27 24 28   Calcium 8.9 - 10.3 mg/dL 9.1 8.0(L) 8.6(L)  Total Protein 6.5 - 8.1 g/dL 7.3 6.2(L) 6.6  Total Bilirubin 0.3 - 1.2 mg/dL 0.7 1.9(H) 1.8(H)  Alkaline Phos 38 - 126 U/L 158(H) 233(H) 79  AST 15 - 41 U/L 85(H) 273(H) 25  ALT 17 - 63 U/L 96(H) 154(H) 21   Assessment:  76 y.o. yo Male with a problem list including.Marland KitchenMarland Kitchen  Patient Active Problem List   Diagnosis Date Noted  . Chronic cholecystitis due to cholelithiasis with choledocholithiasis 08/23/2017  . Acute cholecystitis 08/21/2017  . Personal history of colonic polyps   . Benign neoplasm of ascending colon   . Polyp of sigmoid colon   . Rectal polyp   . Low back pain 03/19/2017  . Viral upper respiratory infection 09/02/2016  . Neuropathy, diabetic ([place]) 10/23/2015  . History of CVA (cerebrovascular accident) 06/29/2014  . H/O aortic valve replacement 06/29/2014  .  Cerebrovascular accident (CVA) due to embolism of left middle cerebral artery ([place]) 06/29/2014  . Mitral valve annular calcification 06/29/2014  . Leukocytosis 05/22/2014  . Thrombocytopenia ([place]) 05/22/2014  . Dysarthria 05/21/2014  . Hyperglycemia, unspecified 05/21/2014  . Hyponatremia 05/21/2014  . H/O coronary artery bypass surgery 05/19/2014  . Obesity (BMI 30-39.9) 05/21/2013  . Medicare annual wellness visit, subsequent 07/29/2012  . Conjunctival cyst of left eye 06/26/2012  . Urge incontinence 01/13/2012  . CAD (coronary artery disease) 01/13/2012  . Pulmonary hypertension ([place]) 07/24/2011  . Diabetes mellitus type 2 in obese ([place]) 07/03/2011  . Hypertension 07/03/2011  . Hyperlipidemia 07/03/2011    presents to clinic for post-op follow-up evaluation, doing very well s/p laparoscopic cholecystectomy for severe gangrenous cholecystitis attributable to biliary sludge (microcholelithiasis).  Plan:              - advance diet as tolerated  - discussed post-surgical labs with patient and his wife             - okay to in 1 week submerge incisions under water (baths, swimming) prn             - in 1 more week, gradually resume all activities without restrictions over following 2 weeks             - return to clinic as needed, instructed to call office if any questions or concerns   All of the above recommendations were discussed with the patient and patient's family, and all of patient's and family's questions were answered to their expressed satisfaction.  -- Marilynne Drivers Rosana Hoes, MD, [place]: [facility] - Partnering for exceptional care. Office: [phone]

## 2017-08-29 NOTE — Patient Instructions (Signed)

## 2017-09-03 NOTE — Discharge Summary (Signed)
Physician Discharge Summary  Patient ID: ZAMARIAN SCARANO MRN: [id number] DOB/AGE: 1941/02/07 76 y.o.  Admit date: 08/21/2017 Discharge date: 09/03/2017  Admission Diagnoses:  Discharge Diagnoses:  Active Problems:   Acute cholecystitis   Chronic cholecystitis due to cholelithiasis with choledocholithiasis   Discharged Condition: good  Hospital Course: 76 y.o. male presented to [facility] ED for abdominal pain. Workup was found to be significant for ultrasound and nuclear medicine imaging demonstrating acute cholecystitis attributable to biliary "sludge" (microcholelithiasis). Medical and cardiology risk-stratification and optimization were requested considering his significant cardiovascular and cardiac surgery history. Following admission, patient's total bilirubin also increased from 1.9 to 2.5 despite non-dilated biliary ducts, but then decreased back to 1.8, consistent with passage of biliary "sludge" (microcholelithiasis). Informed consent was obtained and documented, and patient underwent rather challenging, but ultimately safe and successful laparoscopic cholecystectomy Rosana Hoes, 08/23/2017).  Post-operatively, patient's abdominal pain completely resolved and advancement of patient's diet and ambulation were well-tolerated. Repeat bilirubin the morning following surgery remained 1.9. The remainder of patient's hospital course was essentially unremarkable, and discharge planning was initiated accordingly with patient safely able to be discharged home with appropriate discharge instructions, pain control, and close outpatient surgical follow-up with repeat CMP/LFT's after all of his and his family's questions were answered to their expressed satisfaction.  Consults: cardiology and hospitalist/internal medicine  Significant Diagnostic Studies: labs: total bilirubin 2.5 (post-surgery remains 1.9) and radiology: Ultrasound: acute cholecystitis with biliary sludge (microcholelithiasis) and HIDA:  non-visualization of gallbladder consistent with acute cholecystitis  Treatments: IV hydration, antibiotics: Zosyn and surgery: laparoscopic cholecystectomy  Discharge Exam: Blood pressure (!) 159/78, pulse 91, temperature 97.7 F (36.5 C), temperature source Oral, resp. rate 16, height '5\' 8"'$  (1.727 m), weight 217 lb 1.6 oz (98.5 kg), SpO2 97 %. General appearance: alert, cooperative and no distress GI: abdomen soft, completely non-tender, and non-distended with post-surgical abdominal incisions well-approximated without erythema or drainage  Disposition: 01-Home or Self Care   Allergies as of 08/24/2017      Reactions   Grapefruit Extract Other (See Comments)   Mouth sores   Other Other (See Comments)      Medication List    TAKE these medications   acetaminophen 325 MG tablet Commonly known as:  TYLENOL Take by mouth.   aspirin 81 MG tablet Take 81 mg by mouth daily.   fesoterodine 8 MG Tb24 tablet Commonly known as:  TOVIAZ Take 8 mg by mouth daily.   fexofenadine 180 MG tablet Commonly known as:  ALLEGRA Take 180 mg by mouth daily.   fluticasone 50 MCG/ACT nasal spray Commonly known as:  FLONASE Place into the nose Daily.   glipiZIDE 5 MG tablet Commonly known as:  GLUCOTROL TAKE 1 TABLET BY MOUTH TWICE A DAY BEFORE MEALS   glucose blood test strip Commonly known as:  ONE TOUCH ULTRA TEST Use as instructed   metFORMIN 1000 MG tablet Commonly known as:  GLUCOPHAGE Take 1 tablet (1,000 mg total) by mouth 2 (two) times daily with a meal.   metoprolol tartrate 25 MG tablet Commonly known as:  LOPRESSOR Take 12.5 mg by mouth daily. Take half tablet in the am and half in pm   multivitamin tablet Take 1 tablet by mouth daily.   nitroGLYCERIN 0.4 MG SL tablet Commonly known as:  NITROSTAT Place 0.4 mg under the tongue every 5 (five) minutes as needed.   ONE TOUCH ULTRA SYSTEM KIT w/Device Kit 1 kit by Does not apply route once.   rosuvastatin 20 MG  tablet Commonly known as:  CRESTOR Take 1 tablet (20 mg total) by mouth daily.   triamterene-hydrochlorothiazide 37.5-25 MG capsule Commonly known as:  DYAZIDE Take 1 capsule by mouth daily.   Vitamin D3 2000 units Tabs Take 1 tablet by mouth daily.      Follow-up Information    Vickie Epley, MD. Schedule an appointment as soon as possible for a visit in 1 week(s).   Specialty:  General Surgery Why:  Will need labs drawn (LFT's or CMP) prior to follow-up appointment. Contact information: [place] [address] [phone]           Signed: Vickie Epley 09/03/2017, 11:44 PM

## 2017-09-04 ENCOUNTER — Ambulatory Visit: Payer: Self-pay

## 2017-09-04 DIAGNOSIS — E1169 Type 2 diabetes mellitus with other specified complication: Secondary | ICD-10-CM

## 2017-09-04 DIAGNOSIS — K811 Chronic cholecystitis: Secondary | ICD-10-CM

## 2017-09-04 DIAGNOSIS — E669 Obesity, unspecified: Principal | ICD-10-CM

## 2017-09-04 MED ORDER — EMPAGLIFLOZIN 10 MG PO TABS: 10.0000 mg | ORAL_TABLET | Freq: Every day | ORAL | 3 refills | Status: DC

## 2017-09-04 NOTE — Telephone Encounter (Signed)
Patient is willing to try Jardiance.

## 2017-09-04 NOTE — Addendum Note (Signed)
Addended by: Leone Haven on: 09/04/2017 03:58 PM   Modules accepted: Orders

## 2017-09-04 NOTE — Telephone Encounter (Signed)
Pharmacy is [facility] on [address]. When would you like him to come in to have A1c checked?

## 2017-09-04 NOTE — Telephone Encounter (Signed)
He can come in for the A1c as soon as he is able.  An order has been placed.  Please counsel him that once he starts the Jardiance if he were to develop an illness where he had vomiting or diarrhea he should hold the Jardiance until the vomiting and diarrhea has resolved and then restart it at that time.  Thanks.

## 2017-09-04 NOTE — Telephone Encounter (Signed)
Patient aware and scheduled for nurse visit to have A1c checked next Wednesday

## 2017-09-04 NOTE — Telephone Encounter (Signed)
Please advise 

## 2017-09-04 NOTE — Telephone Encounter (Signed)
  Reason for Disposition . Caller has NON-URGENT medication question about med that PCP prescribed and triager unable to answer question  Answer Assessment - Initial Assessment Questions 1. BLOOD GLUCOSE: "What is your blood glucose level?"      317 after eating a banana.  184 fasting this morning 2. ONSET: "When did you check the blood glucose?"     Checks it fasting every morning and throughout the day. 3. USUAL RANGE: "What is your glucose level usually?" (e.g., usual fasting morning value, usual evening value)     100's 4. KETONES: "Do you check for ketones (urine or blood test strips)?" If yes, ask: "What does the test show now?"      No 5. TYPE 1 or 2:  "Do you know what type of diabetes you have?"  (e.g., Type 1, Type 2, Gestational; doesn't know)      Type 2 6. INSULIN: "Do you take insulin?" If yes, ask: "Have you missed any shots recently?"     No - no missed doses 7. DIABETES PILLS: "Do you take any pills for your diabetes?" If yes, ask: "Have you missed taking any pills recently?"     Yes- no missed 8. OTHER SYMPTOMS: "Do you have any symptoms?" (e.g., fever, frequent urination, difficulty breathing, dizziness, weakness, vomiting)     Feels tired and listless. Poor appetite. 9. PREGNANCY: "Is there any chance you are pregnant?" "When was your last menstrual period?"     No  Protocols used: DIABETES - HIGH BLOOD SUGAR-A-AH 09/01/17  Fasting 148  After breakfast  166 09/02/17 321 after breakfast    2:30 p.m.  117 09/03/17  Fasting 130    After breakfast  241 09/04/17  Fasting  184   After breakfast(banana only) 317 Pt. Report poor appetite. Feels like he needs his " diabetic medicine increased.

## 2017-09-04 NOTE — Telephone Encounter (Signed)
He certainly could benefit from adding an additional diabetic medication.  I am not sure I would increase either of his medications at this time.  We should check an A1c as well.  I would suggest adding Jardiance if he is willing.  This works by causing the patient to urinate out that her glucose.  There is a small risk of UTI with this.  If he is willing I can send this to his pharmacy.

## 2017-09-05 ENCOUNTER — Telehealth: Payer: Self-pay

## 2017-09-05 ENCOUNTER — Telehealth: Payer: Self-pay | Admitting: Family Medicine

## 2017-09-05 NOTE — Telephone Encounter (Signed)
Routed to wrong pool, please route accordingly

## 2017-09-05 NOTE — Telephone Encounter (Signed)
I completed a Prior Auth for jardiance but it was denied, is there anything else we can do?

## 2017-09-05 NOTE — Telephone Encounter (Signed)
Prior Guy Phillips has been completed key: [id number]

## 2017-09-05 NOTE — Telephone Encounter (Signed)
Do you know what kind of insurance he has? His plan likely covers either farxiga or invokana instead.

## 2017-09-05 NOTE — Telephone Encounter (Signed)
Copied from [place]. Topic: Quick Communication - See Telephone Encounter >> Sep 05, 2017 11:06 AM Percell Belt A wrote: CRM for notification. See Telephone encounter for:   pt called in and wanting to know if you have rec'd a PA for  empagliflozin (JARDIANCE) 10 MG TABS tablet [[id number]] . He stated that the pharmacy faxed it over yesterday?  Did you rec this?    09/05/17.

## 2017-09-05 NOTE — Telephone Encounter (Signed)
Patient notiifed

## 2017-09-08 NOTE — Telephone Encounter (Signed)
[employer] employee and medicare part a and b

## 2017-09-10 ENCOUNTER — Other Ambulatory Visit (INDEPENDENT_AMBULATORY_CARE_PROVIDER_SITE_OTHER): Payer: Medicare Other

## 2017-09-10 DIAGNOSIS — E669 Obesity, unspecified: Secondary | ICD-10-CM

## 2017-09-10 DIAGNOSIS — E1169 Type 2 diabetes mellitus with other specified complication: Secondary | ICD-10-CM | POA: Diagnosis not present

## 2017-09-10 LAB — POCT GLYCOSYLATED HEMOGLOBIN (HGB A1C): Hemoglobin A1C: 7

## 2017-09-10 NOTE — Telephone Encounter (Signed)
Per my search Vania Rea should be covered on his insurance? I would try sending invokana instead.   Carlean Jews, Pharm.D., BCPS PGY2 Ambulatory Care Pharmacy Resident Phone: [phone]

## 2017-09-10 NOTE — Telephone Encounter (Signed)
Please advise 

## 2017-09-11 NOTE — Telephone Encounter (Signed)
Patient states he would like to try decreasing his A1C with what he has and dietary changes for now.

## 2017-09-11 NOTE — Telephone Encounter (Signed)
Noted. I think that is reasonable.

## 2017-09-11 NOTE — Telephone Encounter (Signed)
Please let the patient know that we were not able to get the prior authorization completed for Jardiance.  Please let him know that another option would be a medication called Invokana which is similar to Falcon.  This medication does have a slight increased risk of amputation in the lower extremities associated with it.  If he would like to try this medicine I can send it to his pharmacy.  If he does not want to try this medicine we can try an alternative medication such as Januvia or Tradjenta.

## 2017-10-15 ENCOUNTER — Other Ambulatory Visit: Payer: Self-pay | Admitting: Family Medicine

## 2017-10-28 DIAGNOSIS — Z85828 Personal history of other malignant neoplasm of skin: Secondary | ICD-10-CM | POA: Diagnosis not present

## 2017-10-28 DIAGNOSIS — L821 Other seborrheic keratosis: Secondary | ICD-10-CM | POA: Diagnosis not present

## 2017-10-28 DIAGNOSIS — L570 Actinic keratosis: Secondary | ICD-10-CM | POA: Diagnosis not present

## 2017-11-21 ENCOUNTER — Ambulatory Visit: Payer: Medicare Other | Admitting: Family Medicine

## 2017-11-22 ENCOUNTER — Other Ambulatory Visit: Payer: Self-pay | Admitting: Family Medicine

## 2017-11-25 ENCOUNTER — Ambulatory Visit: Payer: Medicare Other | Admitting: Family Medicine

## 2017-11-25 DIAGNOSIS — R52 Pain, unspecified: Secondary | ICD-10-CM | POA: Diagnosis not present

## 2017-11-25 DIAGNOSIS — L570 Actinic keratosis: Secondary | ICD-10-CM | POA: Diagnosis not present

## 2017-12-15 ENCOUNTER — Other Ambulatory Visit: Payer: Self-pay | Admitting: Family Medicine

## 2017-12-16 ENCOUNTER — Encounter: Payer: Self-pay | Admitting: Family Medicine

## 2017-12-16 ENCOUNTER — Other Ambulatory Visit: Payer: Self-pay

## 2017-12-16 ENCOUNTER — Ambulatory Visit (INDEPENDENT_AMBULATORY_CARE_PROVIDER_SITE_OTHER): Payer: Medicare Other | Admitting: Family Medicine

## 2017-12-16 VITALS — BP 130/62 | HR 68 | Temp 99.1°F | Ht 68.0 in | Wt 219.2 lb

## 2017-12-16 DIAGNOSIS — E785 Hyperlipidemia, unspecified: Secondary | ICD-10-CM | POA: Diagnosis not present

## 2017-12-16 DIAGNOSIS — I10 Essential (primary) hypertension: Secondary | ICD-10-CM

## 2017-12-16 DIAGNOSIS — E669 Obesity, unspecified: Secondary | ICD-10-CM | POA: Diagnosis not present

## 2017-12-16 DIAGNOSIS — Z125 Encounter for screening for malignant neoplasm of prostate: Secondary | ICD-10-CM | POA: Diagnosis not present

## 2017-12-16 DIAGNOSIS — K810 Acute cholecystitis: Secondary | ICD-10-CM

## 2017-12-16 DIAGNOSIS — E1169 Type 2 diabetes mellitus with other specified complication: Secondary | ICD-10-CM | POA: Diagnosis not present

## 2017-12-16 DIAGNOSIS — N3941 Urge incontinence: Secondary | ICD-10-CM | POA: Diagnosis not present

## 2017-12-16 NOTE — Assessment & Plan Note (Signed)
Status post cholecystectomy.  LFTs do not appear to have been rechecked though were trending down.  We will recheck these with his fasting labs.

## 2017-12-16 NOTE — Assessment & Plan Note (Signed)
Well-controlled.  Continue triamterene/HCTZ and carvedilol.  His medication regimen was confirmed with him.  Return for fasting labs.

## 2017-12-16 NOTE — Patient Instructions (Signed)
Nice to see you. Please see your eye doctor for yearly eye exam. We will have you return for fasting lab work.

## 2017-12-16 NOTE — Assessment & Plan Note (Signed)
We will need to check an A1c.  Continue current regimen.

## 2017-12-16 NOTE — Progress Notes (Signed)
  Tommi Rumps, MD Phone: [phone]  Guy Phillips is a 77 y.o. male who presents today for f/u.  HYPERTENSION Disease Monitoring: Blood pressure range-not checking  Chest pain- no      Dyspnea- no Medications: Compliance- triamterene, hctz, coreg   Edema- no  DIABETES Disease Monitoring: Blood Sugar ranges-120s Polyuria/phagia/dipsia- no      Optho- needs to see soon Medications: Compliance- taking glipizide, metformin Hypoglycemic symptoms- rare, though no true hypoglycemia, glucose never less than 70  HYPERLIPIDEMIA Disease Monitoring: See symptoms for Hypertension Medications: Compliance- taking crestor Right upper quadrant pain- no  Muscle aches- no  Recently underwent cholecystectomy after being found to have acute cholecystitis.  He has done quite well since then.  He has had a history of overactive bladder.  Previously seen by urology and started on [place].  Notes that helped significantly with his symptoms though he became constipated with it.  Notes urinary urgency.  No straining or starting and stopping.  He does feel like he empties his bladder fully.  Social History   Tobacco Use  Smoking Status Former Smoker  . Last attempt to quit: 07/03/1991  . Years since quitting: 26.4  Smokeless Tobacco Never Used     ROS see history of present illness  Objective  Physical Exam Vitals:   12/16/17 1437  BP: 130/62  Pulse: 68  Temp: 99.1 F (37.3 C)  SpO2: 97%    BP Readings from Last 3 Encounters:  12/16/17 130/62  08/29/17 127/79  08/24/17 (!) 159/78   Wt Readings from Last 3 Encounters:  12/16/17 219 lb 3.2 oz (99.4 kg)  08/29/17 213 lb 6.4 oz (96.8 kg)  08/21/17 217 lb 1.6 oz (98.5 kg)    Physical Exam  Constitutional: No distress.  Cardiovascular: Normal rate, regular rhythm and normal heart sounds.  Pulmonary/Chest: Effort normal and breath sounds normal.  Genitourinary: Rectum normal and prostate normal.  Musculoskeletal: He  exhibits no edema.  Neurological: He is alert. Gait normal.  Skin: Skin is warm and dry. He is not diaphoretic.     Assessment/Plan: Please see individual problem list.  Hypertension Well-controlled.  Continue triamterene/HCTZ and carvedilol.  His medication regimen was confirmed with him.  Return for fasting labs.  Acute cholecystitis Status post cholecystectomy.  LFTs do not appear to have been rechecked though were trending down.  We will recheck these with his fasting labs.  Diabetes mellitus type 2 in obese [facility]) We will need to check an A1c.  Continue current regimen.  Hyperlipidemia Return for fasting labs.  Continue Crestor.  Urge incontinence Chronic issue.  Relatively stable.  Prostate exam normal today.  We will check a PSA.  IPSS score of 12 with mostly satisfied.  We will consider treating with Myrbetriq once his PSA returns.   Orders Placed This Encounter  Procedures  . Comp Met (CMET)    Standing Status:   Future    Standing Expiration Date:   12/17/2018  . HgB A1c    Standing Status:   Future    Standing Expiration Date:   12/17/2018  . Lipid panel    Standing Status:   Future    Standing Expiration Date:   12/17/2018  . PSA, Medicare    Standing Status:   Future    Standing Expiration Date:   12/17/2018    No orders of the defined types were placed in this encounter.    Tommi Rumps, MD [place]

## 2017-12-16 NOTE — Assessment & Plan Note (Signed)
Return for fasting labs.  Continue Crestor.

## 2017-12-16 NOTE — Assessment & Plan Note (Signed)
Chronic issue.  Relatively stable.  Prostate exam normal today.  We will check a PSA.  IPSS score of 12 with mostly satisfied.  We will consider treating with Myrbetriq once his PSA returns.

## 2017-12-23 ENCOUNTER — Other Ambulatory Visit (INDEPENDENT_AMBULATORY_CARE_PROVIDER_SITE_OTHER): Payer: Medicare Other

## 2017-12-23 DIAGNOSIS — E669 Obesity, unspecified: Secondary | ICD-10-CM

## 2017-12-23 DIAGNOSIS — E1169 Type 2 diabetes mellitus with other specified complication: Secondary | ICD-10-CM

## 2017-12-23 DIAGNOSIS — E785 Hyperlipidemia, unspecified: Secondary | ICD-10-CM

## 2017-12-23 DIAGNOSIS — K810 Acute cholecystitis: Secondary | ICD-10-CM | POA: Diagnosis not present

## 2017-12-23 DIAGNOSIS — Z125 Encounter for screening for malignant neoplasm of prostate: Secondary | ICD-10-CM

## 2017-12-23 LAB — LIPID PANEL
CHOLESTEROL: 126 mg/dL (ref 0–200)
HDL: 35.4 mg/dL — AB (ref >39.00)
LDL Cholesterol: 52 mg/dL (ref 0–99)
NONHDL: 90.46
Total CHOL/HDL Ratio: 4
Triglycerides: 191 mg/dL — ABNORMAL HIGH (ref 0.0–149.0)
VLDL: 38.2 mg/dL (ref 0.0–40.0)

## 2017-12-23 LAB — COMPREHENSIVE METABOLIC PANEL
ALK PHOS: 65 U/L (ref 39–117)
ALT: 14 U/L (ref 0–53)
AST: 14 U/L (ref 0–37)
Albumin: 3.9 g/dL (ref 3.5–5.2)
BILIRUBIN TOTAL: 0.9 mg/dL (ref 0.2–1.2)
BUN: 16 mg/dL (ref 6–23)
CO2: 32 mEq/L (ref 19–32)
Calcium: 9.2 mg/dL (ref 8.4–10.5)
Chloride: 101 mEq/L (ref 96–112)
Creatinine, Ser: 0.97 mg/dL (ref 0.40–1.50)
GFR: 80.1 mL/min (ref >60.00)
Glucose, Bld: 135 mg/dL — ABNORMAL HIGH (ref 70–99)
Potassium: 4.3 mEq/L (ref 3.5–5.1)
Sodium: 138 mEq/L (ref 135–145)
Total Protein: 6.4 g/dL (ref 6.0–8.3)

## 2017-12-23 LAB — HEMOGLOBIN A1C: Hgb A1c MFr Bld: 6.7 % — ABNORMAL HIGH (ref 4.6–6.5)

## 2017-12-23 LAB — PSA, MEDICARE: PSA: 6.56 ng/mL — AB (ref 0.10–4.00)

## 2017-12-24 ENCOUNTER — Other Ambulatory Visit: Payer: Self-pay | Admitting: Family Medicine

## 2017-12-26 ENCOUNTER — Other Ambulatory Visit: Payer: Self-pay | Admitting: Family Medicine

## 2017-12-26 DIAGNOSIS — R972 Elevated prostate specific antigen [PSA]: Secondary | ICD-10-CM

## 2018-01-23 ENCOUNTER — Encounter: Payer: Self-pay | Admitting: Urology

## 2018-01-23 ENCOUNTER — Ambulatory Visit (INDEPENDENT_AMBULATORY_CARE_PROVIDER_SITE_OTHER): Payer: Medicare Other | Admitting: Urology

## 2018-01-23 VITALS — BP 133/82 | HR 66 | Resp 16 | Ht 68.0 in | Wt 215.7 lb

## 2018-01-23 DIAGNOSIS — R972 Elevated prostate specific antigen [PSA]: Secondary | ICD-10-CM | POA: Diagnosis not present

## 2018-01-23 DIAGNOSIS — R350 Frequency of micturition: Secondary | ICD-10-CM | POA: Diagnosis not present

## 2018-01-23 DIAGNOSIS — N3281 Overactive bladder: Secondary | ICD-10-CM

## 2018-01-23 LAB — URINALYSIS, COMPLETE
Bilirubin, UA: NEGATIVE
Glucose, UA: NEGATIVE
Leukocytes, UA: NEGATIVE
Nitrite, UA: NEGATIVE
PH UA: 5 (ref 5.0–7.5)
Protein, UA: NEGATIVE
RBC UA: NEGATIVE
Specific Gravity, UA: 1.02 (ref 1.005–1.030)
UUROB: 1 mg/dL (ref 0.2–1.0)

## 2018-01-23 LAB — MICROSCOPIC EXAMINATION
RBC MICROSCOPIC, UA: NONE SEEN /HPF (ref 0–2)
WBC UA: NONE SEEN /HPF (ref 0–5)

## 2018-01-23 LAB — BLADDER SCAN AMB NON-IMAGING

## 2018-01-23 MED ORDER — MIRABEGRON ER 25 MG PO TB24: 25.0000 mg | ORAL_TABLET | Freq: Every day | ORAL | 11 refills | Status: DC

## 2018-01-23 NOTE — Progress Notes (Signed)
01/23/2018 3:53 PM   Guy Phillips [birth year] [id number]  Referring provider: Leone Haven, MD [address] [place], [place] [address]  No chief complaint on file.   HPI: The patient is a 77 year old gentleman with past medical history significant for CAD status post CABG and cardiac catheterization and stroke who who presents today for evaluation of the PSA of 6.56 in April 2019.  He had a last checked 5 years ago, and it was 2.35 at that time.  He has no previous history of elevated PSAs.  He has no family history of prostate cancer.  He is currently on Plavix.  The patient also complains of severe urinary urgency with urge incontinence.  He has tried [employer] for this before which resolved the symptoms, but he had to stop it due to constipation.  He is very bothered by this.  He has no other significant urinary symptoms.  He has nocturia x1.  He feels that he empties his bladder.  He has a good stream.  He does not have to strain.   PMH: Past Medical History:  Diagnosis Date  . CAD (coronary artery disease)    s/p MI 2011 Cath showed 100% occlusion of RCA with collaterals open, followed by Dr. Ubaldo Glassing  . Dental bridge present    also caps and implants  . Diabetes mellitus Dx 2005   HgbA1c 6.8%, Optho [facility] 2001  . Hernia    Umbilical  . Hyperlipidemia   . Hypertension   . Kidney stone    2008  . 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 Evorn Gong  . Stroke ([place]) 05/20/2014   small left hemispheric cortical stroke following CABG.  No deficits  . Uses hearing aid    has.  Doesn't usually wear    Surgical History: Past Surgical History:  Procedure Laterality Date  . CHOLECYSTECTOMY N/A 08/23/2017   Procedure: LAPAROSCOPIC CHOLECYSTECTOMY;  Surgeon: Vickie Epley, MD;  Location: [facility];  Service: General;  Laterality: N/A;  . COLONOSCOPY WITH PROPOFOL N/A 04/28/2017   Procedure: COLONOSCOPY WITH PROPOFOL;  Surgeon: Lucilla Lame, MD;  Location: [facility];  Service: Gastroenterology;  Laterality: N/A;  Diabetic - oral meds  . CORONARY ARTERY BYPASS GRAFT  2015   x3 vessels, with AVR  . EYE SURGERY     cataract extraction, bilateral  . FUNCTIONAL ENDOSCOPIC SINUS SURGERY     Dr. Fatima Sanger in [place]  . POLYPECTOMY  04/28/2017   Procedure: POLYPECTOMY;  Surgeon: Lucilla Lame, MD;  Location: [facility];  Service: Gastroenterology;;    Home Medications:  Allergies as of 01/23/2018      Reactions   Grapefruit Extract Other (See Comments)   Mouth sores   Other Other (See Comments)   Pollen Extract Rash      Medication List        Accurate as of 01/23/18  3:53 PM. Always use your most recent med list.          acetaminophen 325 MG tablet Commonly known as:  TYLENOL Take by mouth.   aspirin 81 MG tablet Take 81 mg by mouth daily.   carvedilol 12.5 MG tablet Commonly known as:  COREG Take 12.5 mg by mouth 2 (two) times daily with a meal. Take one tablet morning and 1 evening '25mg'$  split in half   clopidogrel 75 MG tablet Commonly known as:  PLAVIX Take 75 mg by mouth 1 day or  1 dose.   fesoterodine 8 MG Tb24 tablet Commonly known as:  TOVIAZ Take 8 mg by mouth daily.   fexofenadine 180 MG tablet Commonly known as:  ALLEGRA Take 180 mg by mouth daily.   fluticasone 50 MCG/ACT nasal spray Commonly known as:  FLONASE Place into the nose Daily.   glipiZIDE 5 MG tablet Commonly known as:  GLUCOTROL TAKE 1 TABLET BY MOUTH TWICE A DAY BEFORE MEALS   glucose blood test strip Commonly known as:  ONE TOUCH ULTRA TEST Use as instructed   metFORMIN 1000 MG tablet Commonly known as:  GLUCOPHAGE TAKE 1 TABLET TWICE DAILY  WITH MEALS   metoprolol tartrate 25 MG tablet Commonly known as:  LOPRESSOR Take 12.5 mg by mouth daily. Take half tablet in the am and half in pm   mirabegron ER 25 MG Tb24 tablet Commonly known as:  MYRBETRIQ Take 1 tablet (25 mg total) by mouth daily.     multivitamin tablet Take 1 tablet by mouth daily.   nitroGLYCERIN 0.4 MG SL tablet Commonly known as:  NITROSTAT Place 0.4 mg under the tongue every 5 (five) minutes as needed.   ONE TOUCH ULTRA SYSTEM KIT w/Device Kit 1 kit by Does not apply route once.   quinapril 20 MG tablet Commonly known as:  ACCUPRIL Take 20 mg by mouth 1 day or 1 dose.   rosuvastatin 20 MG tablet Commonly known as:  CRESTOR TAKE 1 TABLET DAILY   spironolactone 50 MG tablet Commonly known as:  ALDACTONE Take 50 mg by mouth 1 day or 1 dose.   triamterene-hydrochlorothiazide 37.5-25 MG tablet Commonly known as:  MAXZIDE-25 Take 1 tablet by mouth 1 day or 1 dose.   Vitamin D3 2000 units Tabs Take 1 tablet by mouth daily.       Allergies:  Allergies  Allergen Reactions  . Grapefruit Extract Other (See Comments)    Mouth sores  . Other Other (See Comments)  . Pollen Extract Rash    Family History: Family History  Problem Relation Age of Onset  . Cancer Father        liver    Social History:  reports that he quit smoking about 26 years ago. He has never used smokeless tobacco. He reports that he drinks alcohol. He reports that he does not use drugs.  ROS: UROLOGY Frequent Urination?: Yes Hard to postpone urination?: Yes Burning/pain with urination?: No Get up at night to urinate?: Yes Leakage of urine?: Yes Urine stream starts and stops?: No Trouble starting stream?: No Do you have to strain to urinate?: No Blood in urine?: No Urinary tract infection?: No Sexually transmitted disease?: No Injury to kidneys or bladder?: No Painful intercourse?: No Weak stream?: Yes Erection problems?: No Penile pain?: No  Gastrointestinal Nausea?: No Vomiting?: No Indigestion/heartburn?: No Diarrhea?: No Constipation?: No  Constitutional Fever: No Night sweats?: Yes Weight loss?: Yes Fatigue?: No  Skin Skin rash/lesions?: No Itching?: No  Eyes Blurred vision?: No Double  vision?: No  Ears/Nose/Throat Sore throat?: No Sinus problems?: No  Hematologic/Lymphatic Swollen glands?: No Easy bruising?: Yes  Cardiovascular Leg swelling?: No Chest pain?: No  Respiratory Cough?: No Shortness of breath?: No  Endocrine Excessive thirst?: No  Musculoskeletal Back pain?: No Joint pain?: No  Neurological Headaches?: No Dizziness?: No  Psychologic Depression?: No Anxiety?: No  Physical Exam: BP 133/82   Pulse 66   Resp 16   Ht '5\' 8"'$  (1.727 m)   Wt 215 lb 11.2 oz (97.8 kg)  SpO2 98%   BMI 32.80 kg/m   Constitutional:  Alert and oriented, No acute distress. HEENT: [place] AT, moist mucus membranes.  Trachea midline, no masses. Cardiovascular: No clubbing, cyanosis, or edema. Respiratory: Normal respiratory effort, no increased work of breathing. GI: Abdomen is soft, nontender, nondistended, no abdominal masses GU: No CVA tenderness.  DRE: 40 g, benign. Skin: No rashes, bruises or suspicious lesions. Lymph: No cervical or inguinal adenopathy. Neurologic: Grossly intact, no focal deficits, moving all 4 extremities. Psychiatric: Normal mood and affect.  Laboratory Data: Lab Results  Component Value Date   WBC 9.1 08/24/2017   HGB 12.8 (L) 08/24/2017   HCT 37.2 (L) 08/24/2017   MCV 92.2 08/24/2017   PLT 187 08/24/2017    Lab Results  Component Value Date   CREATININE 0.97 12/23/2017    Lab Results  Component Value Date   PSA 6.56 (H) 12/23/2017   PSA 2.35 07/29/2012    No results found for: TESTOSTERONE  Lab Results  Component Value Date   HGBA1C 6.7 (H) 12/23/2017    Urinalysis    Component Value Date/Time   COLORURINE YELLOW (A) 08/21/2017 1049   APPEARANCEUR CLEAR (A) 08/21/2017 1049   LABSPEC 1.021 08/21/2017 1049   PHURINE 5.0 08/21/2017 1049   GLUCOSEU NEGATIVE 08/21/2017 1049   HGBUR NEGATIVE 08/21/2017 1049   BILIRUBINUR NEGATIVE 08/21/2017 1049   BILIRUBINUR neg 07/29/2012 1143   KETONESUR NEGATIVE 08/21/2017  1049   PROTEINUR 30 (A) 08/21/2017 1049   UROBILINOGEN 0.2 07/29/2012 1143   UROBILINOGEN 0.2 10/10/2011 0000   NITRITE NEGATIVE 08/21/2017 1049   LEUKOCYTESUR NEGATIVE 08/21/2017 1049    Assessment & Plan:    1.  Overactive bladder -We will start the patient on Myrbetriq 25 mg.  This will not give them constipation like anticholinergics have in the past.  2.  Elevated PSA I had a very long conversation with the patient regarding his PSA of 6.56.  We did discuss the American urological Association recommends screening only met between 98 and 70 with at least a 10-year life expectancy.  We did also discussed that those guidelines recommend increasing the value of PSA that is considered normal in men over the age of 110.  We discussed options to follow-up on this PSA.  We discussed repeating his PSA in a few months versus a prostate biopsy.  We discussed the risks, benefits, indications of a prostate biopsy.  He understands the risks include bleeding and infection.  He would also need to come off his Plavix for this procedure, and he has multiple medical comorbidities.  He has decided to monitor his PSA instead.  He will follow-up in 3 months with a PSA prior.   Return in about 3 months (around 04/25/2018) for PSA prior.  Nickie Retort, MD  [facility] [address], [place], [place] [address] [phone]

## 2018-02-17 DIAGNOSIS — E119 Type 2 diabetes mellitus without complications: Secondary | ICD-10-CM | POA: Diagnosis not present

## 2018-02-17 DIAGNOSIS — H26492 Other secondary cataract, left eye: Secondary | ICD-10-CM | POA: Diagnosis not present

## 2018-02-17 DIAGNOSIS — H35371 Puckering of macula, right eye: Secondary | ICD-10-CM | POA: Diagnosis not present

## 2018-02-17 DIAGNOSIS — H04123 Dry eye syndrome of bilateral lacrimal glands: Secondary | ICD-10-CM | POA: Diagnosis not present

## 2018-03-18 ENCOUNTER — Encounter: Payer: Self-pay | Admitting: Family Medicine

## 2018-03-18 ENCOUNTER — Ambulatory Visit (INDEPENDENT_AMBULATORY_CARE_PROVIDER_SITE_OTHER): Payer: Medicare Other | Admitting: Family Medicine

## 2018-03-18 DIAGNOSIS — E669 Obesity, unspecified: Secondary | ICD-10-CM | POA: Diagnosis not present

## 2018-03-18 DIAGNOSIS — R410 Disorientation, unspecified: Secondary | ICD-10-CM | POA: Insufficient documentation

## 2018-03-18 DIAGNOSIS — E1169 Type 2 diabetes mellitus with other specified complication: Secondary | ICD-10-CM

## 2018-03-18 DIAGNOSIS — R972 Elevated prostate specific antigen [PSA]: Secondary | ICD-10-CM | POA: Insufficient documentation

## 2018-03-18 DIAGNOSIS — E1142 Type 2 diabetes mellitus with diabetic polyneuropathy: Secondary | ICD-10-CM | POA: Diagnosis not present

## 2018-03-18 DIAGNOSIS — I10 Essential (primary) hypertension: Secondary | ICD-10-CM | POA: Diagnosis not present

## 2018-03-18 MED ORDER — NITROGLYCERIN 0.4 MG SL SUBL: 0.4000 mg | SUBLINGUAL_TABLET | SUBLINGUAL | 1 refills | Status: DC | PRN

## 2018-03-18 NOTE — Patient Instructions (Signed)
Nice to see you. Please monitor your sugar.  I think the confusion he had was related to elevated sugar.  If this recurs please check your sugar and let us know if it does recur.

## 2018-03-18 NOTE — Assessment & Plan Note (Signed)
Potentially related to elevated blood sugar.  He has felt that way in the past when his sugar goes up though he was unable to check at this time.  The first episode was also in the setting of his gallbladder issues and that could account for it.  No apparent neurological deficits.  Mini cog testing normal.  They will monitor and if occurs again they will check his sugar.  If occurs again they will let us know or seek medical attention.

## 2018-03-18 NOTE — Assessment & Plan Note (Signed)
Seems to be well controlled.  A1c most recently was well controlled.  He will continue with his current regimen.  Foot exam completed.

## 2018-03-18 NOTE — Assessment & Plan Note (Signed)
He will continue to monitor.  This is minor in nature.  Offered medication though he deferred.

## 2018-03-18 NOTE — Assessment & Plan Note (Signed)
At goal. Continue current regimen. 

## 2018-03-18 NOTE — Progress Notes (Signed)
Tommi Rumps, MD Phone: [phone]  Guy Phillips is a 77 y.o. male who presents today for f/u.  CC: htn, dm, confusion  HYPERTENSION  Disease Monitoring  Home BP Monitoring not checking Chest pain- no    Dyspnea- no Medications  Compliance-  Taking metoprolol, triamterene/HCTZ, coreg.   Edema- no  DIABETES Disease Monitoring: Blood Sugar ranges-111-130 Polyuria/phagia/dipsia- no      Optho- UTD- saw recently Medications: Compliance- taking glipizide, metformin Hypoglycemic symptoms- occasionally he will get the sweats and will eat a glucose tablet  Does report some issues with diabetic neuropathy.  Has tingling in his bilateral toes sits intermittent.  He does not want to start any medication.  He saw urology for his elevated PSA.  They opted against biopsy given his age.  Discussed rechecking in several months.  Patient's wife reports 2 episodes where the patient appeared confused.  The first time occurred prior to him having his gallbladder removed.  He did not feel well and had pain from that and then the next morning he did not know where they were going though he was alert.  It resolved fairly quickly.  It occurred again a number of weeks ago where he was telling people what way to go but it was the wrong way.  He does have trouble forgetting names.  He had no numbness or weakness or slurred speech.  He had no neurological symptoms.  He thinks his blood sugar was likely high when this occurred.  He was unable to check his sugar.     Social History   Tobacco Use  Smoking Status Former Smoker  . Last attempt to quit: 07/03/1991  . Years since quitting: 26.7  Smokeless Tobacco Never Used     ROS see history of present illness  Objective  Physical Exam Vitals:   03/18/18 1305  BP: 132/76  Pulse: 68  Temp: 98.5 F (36.9 C)  SpO2: 98%    BP Readings from Last 3 Encounters:  03/18/18 132/76  01/23/18 133/82  12/16/17 130/62   Wt Readings from Last  3 Encounters:  03/18/18 214 lb 12.8 oz (97.4 kg)  01/23/18 215 lb 11.2 oz (97.8 kg)  12/16/17 219 lb 3.2 oz (99.4 kg)    Physical Exam  Constitutional: No distress.  Cardiovascular: Normal rate, regular rhythm and normal heart sounds.  Pulmonary/Chest: Effort normal and breath sounds normal.  Musculoskeletal: He exhibits no edema.  Neurological: He is alert.  CN 2-12 intact, 5/5 strength in bilateral biceps, triceps, grip, quads, hamstrings, plantar and dorsiflexion, sensation to light touch intact in bilateral UE and LE, normal gait, minicog 5/5  Skin: Skin is warm and dry. He is not diaphoretic.   Diabetic Foot Exam - Simple   Simple Foot Form Diabetic Foot exam was performed with the following findings:  Yes 03/18/2018  1:37 PM  Visual Inspection See comments:  Yes Sensation Testing Intact to touch and monofilament testing bilaterally:  Yes Pulse Check Posterior Tibialis and Dorsalis pulse intact bilaterally:  Yes Comments Slight purplish bruise on the bottom of his left foot, nontender, no other abnormalities      Assessment/Plan: Please see individual problem list.  Hypertension At goal.  Continue current regimen.  Diabetes mellitus type 2 in obese ([facility]) Seems to be well controlled.  A1c most recently was well controlled.  He will continue with his current regimen.  Foot exam completed.  Confusion Potentially related to elevated blood sugar.  He has felt that way in the past  when his sugar goes up though he was unable to check at this time.  The first episode was also in the setting of his gallbladder issues and that could account for it.  No apparent neurological deficits.  Mini cog testing normal.  They will monitor and if occurs again they will check his sugar.  If occurs again they will let us know or seek medical attention.  Elevated PSA He saw urology.  Potentially age-appropriate PSA.  He will see them at 3 months for PSA recheck.  Neuropathy, diabetic ([place]) He  will continue to monitor.  This is minor in nature.  Offered medication though he deferred.   No orders of the defined types were placed in this encounter.   Meds ordered this encounter  Medications  . nitroGLYCERIN (NITROSTAT) 0.4 MG SL tablet    Sig: Place 1 tablet (0.4 mg total) under the tongue every 5 (five) minutes as needed.    Dispense:  30 tablet    Refill:  [place], MD [place]

## 2018-03-18 NOTE — Assessment & Plan Note (Signed)
He saw urology.  Potentially age-appropriate PSA.  He will see them at 3 months for PSA recheck.

## 2018-03-25 DIAGNOSIS — Z952 Presence of prosthetic heart valve: Secondary | ICD-10-CM | POA: Diagnosis not present

## 2018-03-25 DIAGNOSIS — Z951 Presence of aortocoronary bypass graft: Secondary | ICD-10-CM | POA: Diagnosis not present

## 2018-03-25 DIAGNOSIS — I272 Pulmonary hypertension, unspecified: Secondary | ICD-10-CM | POA: Diagnosis not present

## 2018-03-25 DIAGNOSIS — I2510 Atherosclerotic heart disease of native coronary artery without angina pectoris: Secondary | ICD-10-CM | POA: Diagnosis not present

## 2018-03-25 DIAGNOSIS — I63412 Cerebral infarction due to embolism of left middle cerebral artery: Secondary | ICD-10-CM | POA: Diagnosis not present

## 2018-03-25 DIAGNOSIS — E119 Type 2 diabetes mellitus without complications: Secondary | ICD-10-CM | POA: Diagnosis not present

## 2018-03-25 DIAGNOSIS — E782 Mixed hyperlipidemia: Secondary | ICD-10-CM | POA: Diagnosis not present

## 2018-03-25 DIAGNOSIS — I059 Rheumatic mitral valve disease, unspecified: Secondary | ICD-10-CM | POA: Diagnosis not present

## 2018-04-02 ENCOUNTER — Other Ambulatory Visit: Payer: Self-pay | Admitting: Family Medicine

## 2018-04-19 ENCOUNTER — Other Ambulatory Visit: Payer: Self-pay | Admitting: Family Medicine

## 2018-04-27 ENCOUNTER — Ambulatory Visit (INDEPENDENT_AMBULATORY_CARE_PROVIDER_SITE_OTHER): Payer: Medicare Other

## 2018-04-27 VITALS — BP 132/64 | HR 70 | Temp 98.7°F | Resp 15 | Ht 69.0 in | Wt 217.0 lb

## 2018-04-27 DIAGNOSIS — Z0000 Encounter for general adult medical examination without abnormal findings: Secondary | ICD-10-CM

## 2018-04-27 NOTE — Patient Instructions (Addendum)
  Guy Phillips , Thank you for taking time to come for your Medicare Wellness Visit. I appreciate your ongoing commitment to your health goals. Please review the following plan we discussed and let me know if I can assist you in the future.   Follow up as needed.    Bring a copy of your [place] and/or Living Will to be scanned into chart.  Have a great day!  These are the goals we discussed: Goals    . Maintain Healthy Lifestyle     Stay hydrated Stay active Healthy diet Brain engagement activity       This is a list of the screening recommended for you and due dates:  Health Maintenance  Topic Date Due  . Flu Shot  04/23/2018  . Urine Protein Check  05/20/2018  . Hemoglobin A1C  06/24/2018  . Eye exam for diabetics  01/06/2019  . Complete foot exam   03/19/2019  . Tetanus Vaccine  10/07/2021  . Colon Cancer Screening  04/28/2022  . Pneumonia vaccines  Completed

## 2018-04-27 NOTE — Progress Notes (Signed)
Subjective:   Guy Phillips is a 77 y.o. male who presents for Medicare Annual/Subsequent preventive examination.  Review of Systems:  No ROS.  Medicare Wellness Visit. Additional risk factors are reflected in the social history.  Cardiac Risk Factors include: advanced age (>63mn, >>17women);male gender;obesity (BMI >30kg/m2);diabetes mellitus;hypertension     Objective:    Vitals: BP 132/64 (BP Location: Left Arm, Patient Position: Sitting, Cuff Size: Normal)   Pulse 70   Temp 98.7 F (37.1 C) (Oral)   Resp 15   Ht '5\' 9"'$  (1.753 m)   Wt 217 lb (98.4 kg)   SpO2 97%   BMI 32.05 kg/m   Body mass index is 32.05 kg/m.  Advanced Directives 04/27/2018 08/21/2017 08/21/2017 04/28/2017 04/25/2017 04/24/2016 04/24/2015  Does Patient Have a Medical Advance Directive? Yes Yes Yes Yes Yes Yes Yes  Type of AParamedicof [place]Living will [place]Living will HW[place]Living will HAlpineLiving will HSaksLiving will HMason CityLiving will HArpinLiving will  Does patient want to make changes to medical advance directive? No - Patient declined No - Patient declined - No - Patient declined No - Patient declined - No - Patient declined  Copy of HWatervlietin Chart? No - copy requested No - copy requested Yes No - copy requested No - copy requested No - copy requested No - copy requested    Tobacco Social History   Tobacco Use  Smoking Status Former Smoker  . Last attempt to quit: 07/03/1991  . Years since quitting: 26.8  Smokeless Tobacco Never Used     Counseling given: Not Answered   Clinical Intake:  Pre-visit preparation completed: Yes  Pain : No/denies pain     Nutritional Status: BMI > 30  Obese Diabetes: Yes(Followed by pcp)  How often do you need to have someone help you when you read instructions,  pamphlets, or other written materials from your doctor or pharmacy?: 1 - Never  Interpreter Needed?: No     Past Medical History:  Diagnosis Date  . CAD (coronary artery disease)    s/p MI 2011 Cath showed 100% occlusion of RCA with collaterals open, followed by Dr. FUbaldo Glassing . Dental bridge present    also caps and implants  . Diabetes mellitus Dx 2005   HgbA1c 6.8%, Optho [facility] 2001  . Hernia    Umbilical  . Hyperlipidemia   . Hypertension   . Kidney stone    2008  . 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 DEvorn Gong . [place] 05[place]2014   small left hemispheric cortical stroke following CABG.  No deficits  . Uses hearing aid    has.  Doesn't usually wear   Past Surgical History:  Procedure Laterality Date  . CHOLECYSTECTOMY N/A 08/23/2017   Procedure: LAPAROSCOPIC CHOLECYSTECTOMY;  Surgeon: DVickie Epley MD;  Location: [facility];  Service: General;  Laterality: N/A;  . COLONOSCOPY WITH PROPOFOL N/A 04/28/2017   Procedure: COLONOSCOPY WITH PROPOFOL;  Surgeon: WLucilla Lame MD;  [place]  Service: Gastroenterology;  Laterality: N/A;  Diabetic - oral meds  . CORONARY ARTERY BYPASS GRAFT  2015   x3 vessels, with AVR  . EYE SURGERY     cataract extraction, bilateral  . FUNCTIONAL ENDOSCOPIC SINUS SURGERY     Dr. GFatima Sangerin [place] . POLYPECTOMY  04/28/2017  Procedure: POLYPECTOMY;  Surgeon: Lucilla Lame, MD;  Location: [place];  Service: Gastroenterology;;   Family History  Problem Relation Age of Onset  . Cancer Father        liver   Social History   Socioeconomic History  . Marital status: Married    Spouse name: Not on file  . Number of children: Not on file  . Years of education: Not on file  . Highest education level: Not on file  Occupational History  . Not on file  Social Needs  . Financial resource strain: Not on file  . Food insecurity:    Worry: Not on file    Inability: Not on file  .  Transportation needs:    Medical: Not on file    Non-medical: Not on file  Tobacco Use  . Smoking status: Former Smoker    Last attempt to quit: 07/03/1991    Years since quitting: 26.8  . Smokeless tobacco: Never Used  Substance and Sexual Activity  . Alcohol use: Yes    Comment: 1 glass of wine occ  . Drug use: No  . Sexual activity: Never  Lifestyle  . Physical activity:    Days per week: Not on file    Minutes per session: Not on file  . Stress: Not on file  Relationships  . Social connections:    Talks on phone: Not on file    Gets together: Not on file    Attends religious service: Not on file    Active member of club or organization: Not on file    Attends meetings of clubs or organizations: Not on file    Relationship status: Not on file  Other Topics Concern  . Not on file  Social History Narrative  . Not on file    Outpatient Encounter Medications as of 04/27/2018  Medication Sig  . acetaminophen (TYLENOL) 325 MG tablet Take by mouth.  Marland Kitchen aspirin 81 MG tablet Take 81 mg by mouth daily.    . Blood Glucose Monitoring Suppl (ONE TOUCH ULTRA SYSTEM KIT) w/Device KIT 1 kit by Does not apply route once.  . carvedilol (COREG) 12.5 MG tablet Take 12.5 mg by mouth 2 (two) times daily with a meal. Take one tablet morning and 1 evening '25mg'$  split in half  . Cholecalciferol (VITAMIN D3) 2000 UNITS TABS Take 1 tablet by mouth daily.    . fexofenadine (ALLEGRA) 180 MG tablet Take 180 mg by mouth daily.  . fluticasone (FLONASE) 50 MCG/ACT nasal spray Place into the nose Daily.  Marland Kitchen glipiZIDE (GLUCOTROL) 5 MG tablet TAKE 1 TABLET BY MOUTH TWICE A DAY BEFORE MEALS  . glucose blood (ONE TOUCH ULTRA TEST) test strip USE AS INSTRUCTED  . metFORMIN (GLUCOPHAGE) 1000 MG tablet TAKE 1 TABLET TWICE DAILY  WITH MEALS  . metoprolol tartrate (LOPRESSOR) 25 MG tablet Take 12.5 mg by mouth daily. Take half tablet in the am and half in pm   . Multiple Vitamin (MULTIVITAMIN) tablet Take 1 tablet  by mouth daily.    . nitroGLYCERIN (NITROSTAT) 0.4 MG SL tablet Place 1 tablet (0.4 mg total) under the tongue every 5 (five) minutes as needed.  . rosuvastatin (CRESTOR) 20 MG tablet TAKE 1 TABLET DAILY  . triamterene-hydrochlorothiazide (MAXZIDE-25) 37.5-25 MG tablet Take 1 tablet by mouth 1 day or 1 dose.  . [DISCONTINUED] mirabegron ER (MYRBETRIQ) 25 MG TB24 tablet Take 1 tablet (25 mg total) by mouth daily.   No facility-administered encounter medications on  file as of 04/27/2018.     Activities of Daily Living In your present state of health, do you have any difficulty performing the following activities: 04/27/2018 08/21/2017  Hearing? N N  Vision? N N  Difficulty concentrating or making decisions? N N  Walking or climbing stairs? N N  Dressing or bathing? N N  Doing errands, shopping? N N  Preparing Food and eating ? N -  Using the Toilet? N -  In the past six months, have you accidently leaked urine? N -  Do you have problems with loss of bowel control? N -  Managing your Medications? N -  Managing your Finances? N -  Housekeeping or managing your Housekeeping? N -  Some recent data might be hidden    Patient Care Team: Leone Haven, MD as PCP - General (Family Medicine)   Assessment:   This is a routine wellness examination for P[place].  The goal of the wellness visit is to assist the patient how to close the gaps in care and create a preventative care plan for the patient.   The roster of all physicians providing medical care to patient is listed in the Snapshot section of the chart.  Taking calcium VIT D as appropriate/Osteoporosis risk reviewed.    Safety issues reviewed; Smoke and carbon monoxide detectors in the home. No firearms in the home. Wears seatbelts when driving or riding with others. No violence in the home.  They do not have excessive sun exposure.  Discussed the need for sun protection: hats, long sleeves and the use of sunscreen if there is  significant sun exposure.  Patient is alert, normal appearance, oriented to person/place/and time. Correctly identified the president of the [place] and recalls of 2/3 words.Performs simple calculations and can read correct time from watch face. Displays appropriate judgement.  No new identified risk were noted.  No failures at ADL's or IADL's.    BMI- discussed the importance of a healthy diet, water intake and the benefits of aerobic exercise. Educational material provided.   24 hour diet recall: Moderate diet  Dental- UTD.  Sleep patterns- Sleeps 8 hours at night.  Wakes feeling rested.   Health maintenance gaps- closed.  Patient Concerns: None at this time. Follow up with PCP as needed.  Exercise Activities and Dietary recommendations Current Exercise Habits: Structured exercise class, Type of exercise: walking;treadmill;strength training/weights, Time (Minutes): > 60(120 minutes), Frequency (Times/Week): 3, Weekly Exercise (Minutes/Week): 0, Intensity: Moderate  Goals    . Maintain Healthy Lifestyle     Stay hydrated Stay active Healthy diet Brain engagement activity       Fall Risk Fall Risk  04/27/2018 04/25/2017 04/24/2016 04/24/2015 10/19/2014  Falls in the past year? No No No No No  Depression Screen PHQ 2/9 Scores 04/27/2018 04/25/2017 04/24/2016 04/24/2015  PHQ - 2 Score 0 0 0 0   Cognitive Function MMSE - Mini Mental State Exam 04/27/2018 04/25/2017 04/24/2016 04/24/2015  Orientation to time '5 5 5 5  '$ Orientation to Place '5 5 5 5  '$ Registration '3 3 3 3  '$ Attention/ Calculation '5 5 5 5  '$ Recall '2 3 3 3  '$ Language- name 2 objects '2 2 2 2  '$ Language- repeat '1 1 1 1  '$ Language- follow 3 step command '3 3 3 3  '$ Language- read & follow direction '1 1 1 1  '$ Write a sentence '1 1 1 1  '$ Copy design '1 1 1 1  '$ Total score '29 30 30 '$ 30  Immunization History  Administered Date(s) Administered  . Influenza Split 07/03/2011, 06/16/2012  . Influenza, High Dose Seasonal PF 06/04/2016, 05/20/2017   . Influenza,inj,Quad PF,6+ Mos 06/16/2013, 07/01/2014, 06/28/2015  . Influenza-Unspecified 06/28/2014  . Pneumococcal Conjugate-13 12/28/2013  . Pneumococcal Polysaccharide-23 10/08/2011  . Tdap 10/08/2011  . Zoster 07/29/2008   Screening Tests Health Maintenance  Topic Date Due  . INFLUENZA VACCINE  04/23/2018  . URINE MICROALBUMIN  05/20/2018  . HEMOGLOBIN A1C  06/24/2018  . OPHTHALMOLOGY EXAM  01/06/2019  . FOOT EXAM  03/19/2019  . TETANUS/TDAP  10/07/2021  . COLONOSCOPY  04/28/2022  . PNA vac Low Risk Adult  Completed       Plan:   End of life planning; Advance aging; Advanced directives discussed. Copy of current HCPOA/Living Will requested.    I have personally reviewed and noted the following in the patient's chart:   . Medical and social history . Use of alcohol, tobacco or illicit drugs  . Current medications and supplements . Functional ability and status . Nutritional status . Physical activity . Advanced directives . List of other physicians . Hospitalizations, surgeries, and ER visits in previous 12 months . Vitals . Screenings to include cognitive, depression, and falls . Referrals and appointments  In addition, I have reviewed and discussed with patient certain preventive protocols, quality metrics, and best practice recommendations. A written personalized care plan for preventive services as well as general preventive health recommendations were provided to patient.     Varney Biles, LPN  09/24/8206

## 2018-04-28 DIAGNOSIS — Z85828 Personal history of other malignant neoplasm of skin: Secondary | ICD-10-CM | POA: Diagnosis not present

## 2018-04-28 DIAGNOSIS — L570 Actinic keratosis: Secondary | ICD-10-CM | POA: Diagnosis not present

## 2018-04-28 DIAGNOSIS — D225 Melanocytic nevi of trunk: Secondary | ICD-10-CM | POA: Diagnosis not present

## 2018-05-05 ENCOUNTER — Other Ambulatory Visit: Payer: Medicare Other

## 2018-05-05 DIAGNOSIS — R972 Elevated prostate specific antigen [PSA]: Secondary | ICD-10-CM

## 2018-05-06 LAB — PSA: PROSTATE SPECIFIC AG, SERUM: 7.2 ng/mL — AB (ref 0.0–4.0)

## 2018-05-08 ENCOUNTER — Encounter: Payer: Self-pay | Admitting: Urology

## 2018-05-08 ENCOUNTER — Ambulatory Visit (INDEPENDENT_AMBULATORY_CARE_PROVIDER_SITE_OTHER): Payer: Medicare Other | Admitting: Urology

## 2018-05-08 ENCOUNTER — Other Ambulatory Visit: Payer: Self-pay

## 2018-05-08 VITALS — BP 133/67 | HR 71 | Ht 68.0 in | Wt 216.1 lb

## 2018-05-08 DIAGNOSIS — R350 Frequency of micturition: Secondary | ICD-10-CM | POA: Diagnosis not present

## 2018-05-08 DIAGNOSIS — R972 Elevated prostate specific antigen [PSA]: Secondary | ICD-10-CM

## 2018-05-08 NOTE — Progress Notes (Signed)
   05/08/2018 3:09 PM   Benjaman Lobe 17-Nov-1940 [id number]  Reason for visit: Follow up elevated PSA and urinary frequency  HPI: Mr. Ulbricht is a 77 year old comorbid male with history of severe cardiovascular disease on Brilinta as well as history of stroke we are following for elevated PSAs and urinary frequency.  He previously had been recommended Myrbetriq, however did not start this secondary to high cost.  His urinary frequency has a most completely resolved by cutting out diet Coke.  Regarding his PSA, he has no family history of prostate cancer.   ROS: Please see flowsheet from today's date for complete review of systems.  Physical Exam: BP 133/67   Pulse 71   Ht 5\' 8"  (1.727 m)   Wt 216 lb 1.6 oz (98 kg)   BMI 32.86 kg/m   Constitutional:  Alert and oriented, No acute distress. Respiratory: Normal respiratory effort, no increased work of breathing. GI: Abdomen is soft, nontender, nondistended, no abdominal masses Skin: No rashes, bruises or suspicious lesions. Neurologic: Grossly intact, no focal deficits, moving all 4 extremities. Psychiatric: Normal mood and affect  Laboratory Data:  PSA 04/2018: 7.2 12/2017: 6.56 2014: 2.35   Assessment & Plan:   In summary, Mr. Catena is a co-morbid 77 year old male who we are seeing for elevated PSA.  PSA is relatively stable at 7.2 from 6.6 four months prior.  We had a long conversation about the risks and benefits of PSA screening, and specifically the AUA recommendation of stopping screening at age 61.  With his comorbidities and anticoagulation I would recommend against prostate biopsy at this time and would discontinue PSA screening.  We did discuss the low, but not zero risk of missing a clinically significant prostate cancer.  He is in agreement with stopping PSA screening, and he will follow-up on an as-needed basis if his urinary frequency worsens.   Billey Co, [facility] [address], [place], [place] [address] [phone]

## 2018-05-08 NOTE — Patient Instructions (Signed)

## 2018-07-11 IMAGING — US US ABDOMEN LIMITED
1 series · 14 of 25 positions shown · non-contrast
Comparison: None.

CLINICAL DATA: Right upper quadrant pain for the past 6 days.

EXAM:
ULTRASOUND ABDOMEN LIMITED RIGHT UPPER QUADRANT

[Series 1: us abdomen limited · 0.26mm/px · 14 of 45 slices shown]
[im 1/45]
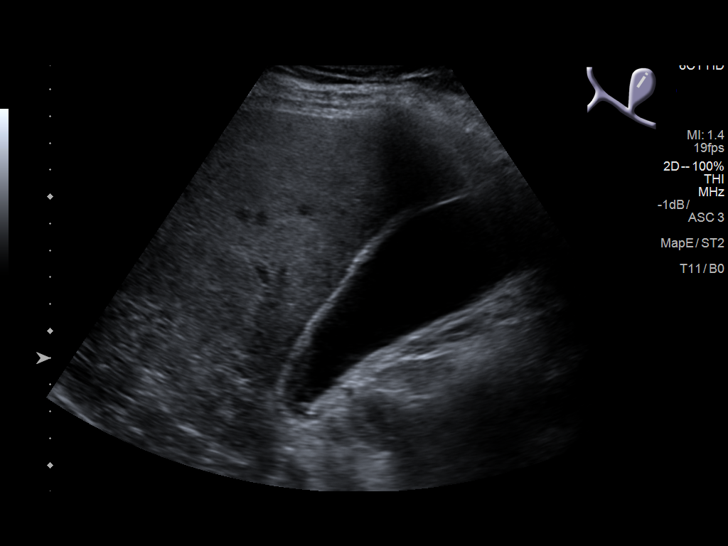
[im 4/45]
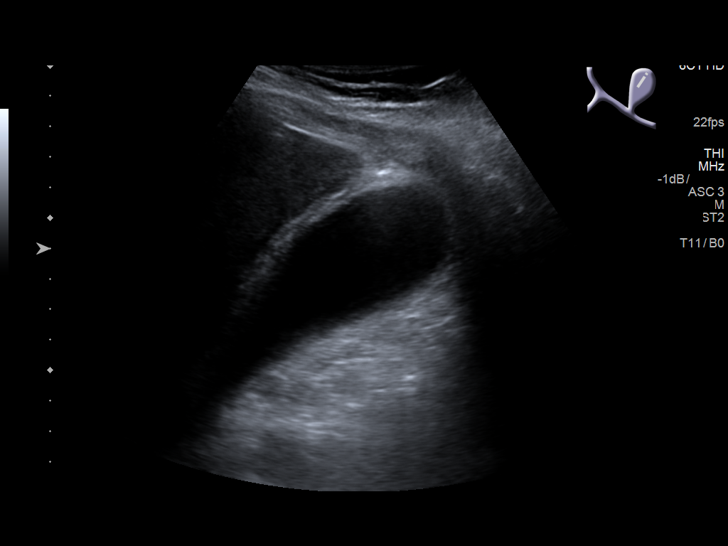
[im 8/45]
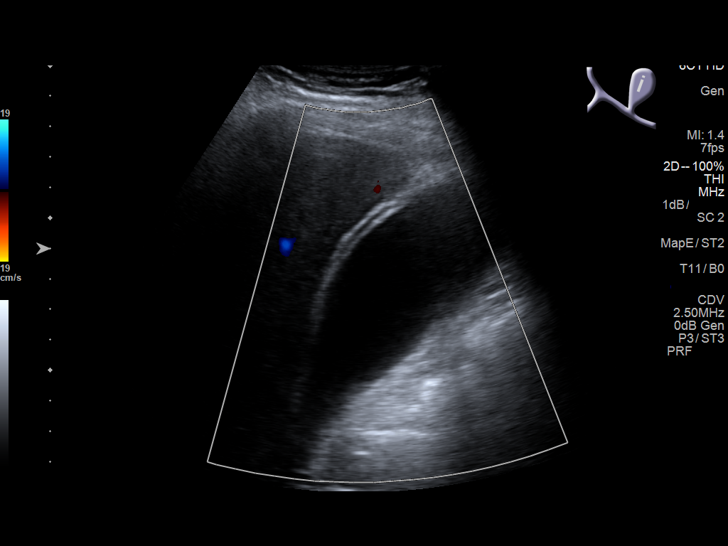
[im 12/45]
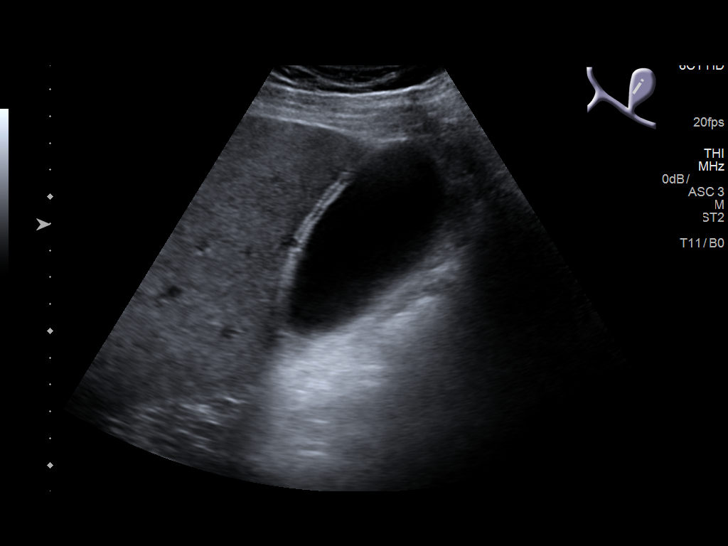
[im 15/45]
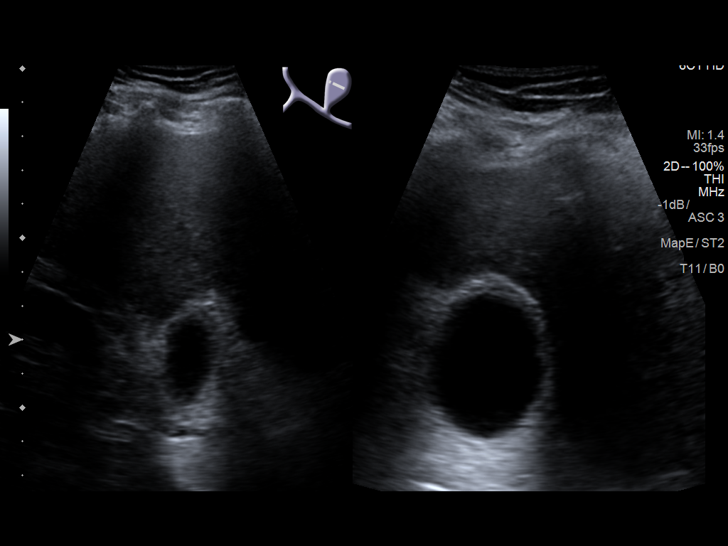
[im 17/45]
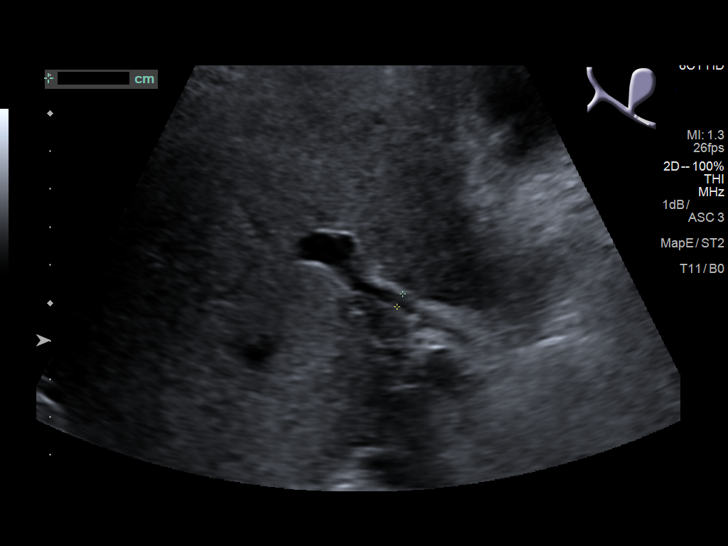
[im 21/45]
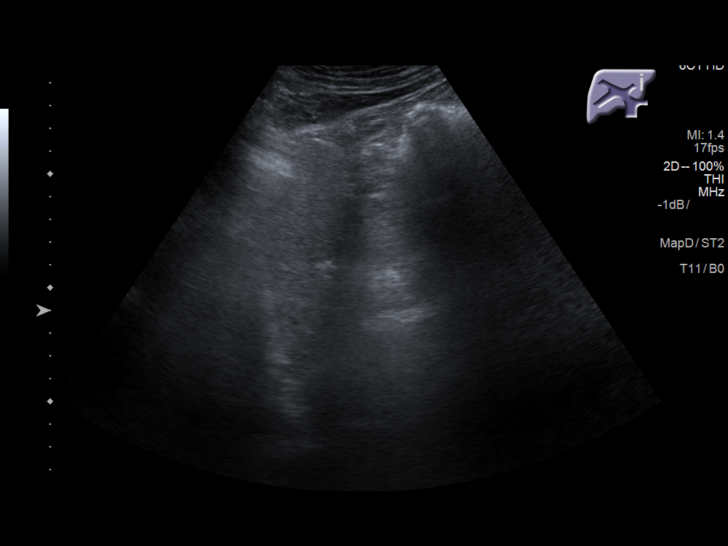
[im 24/45]
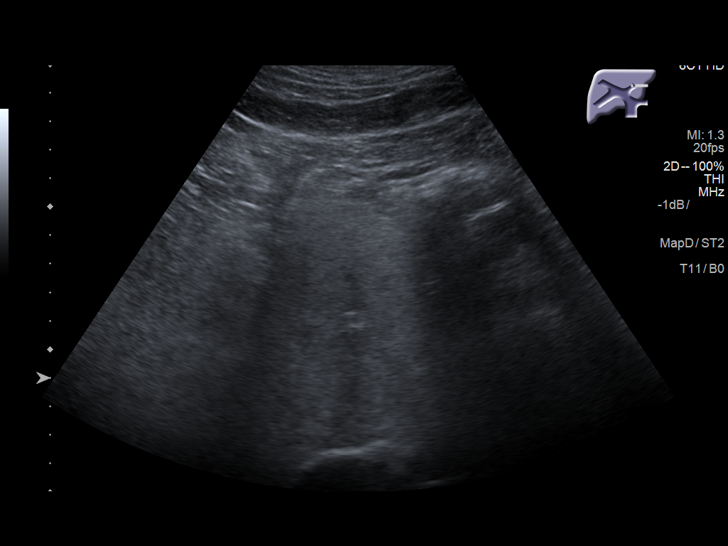
[im 28/45]
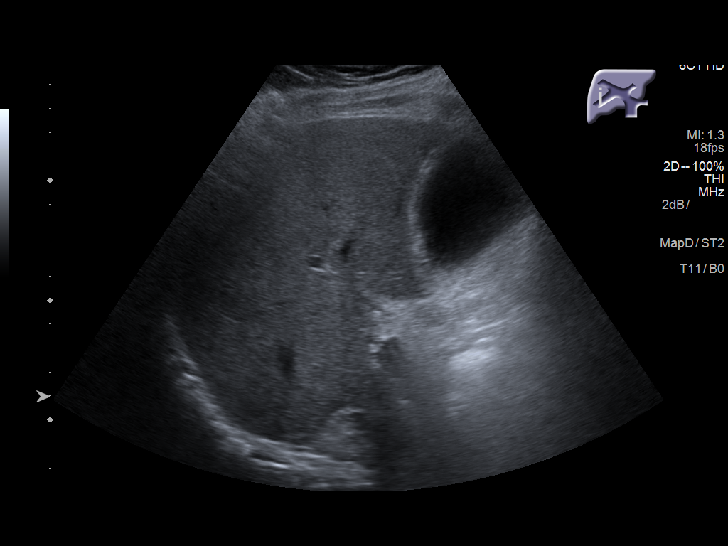
[im 30/45]
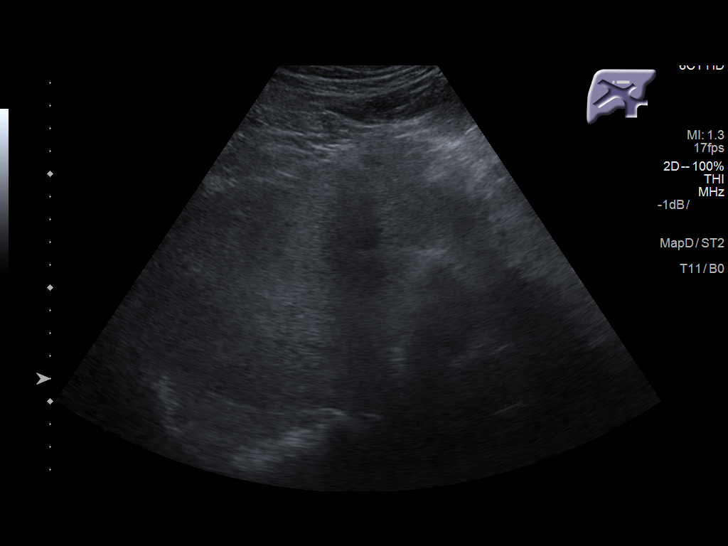
[im 34/45]
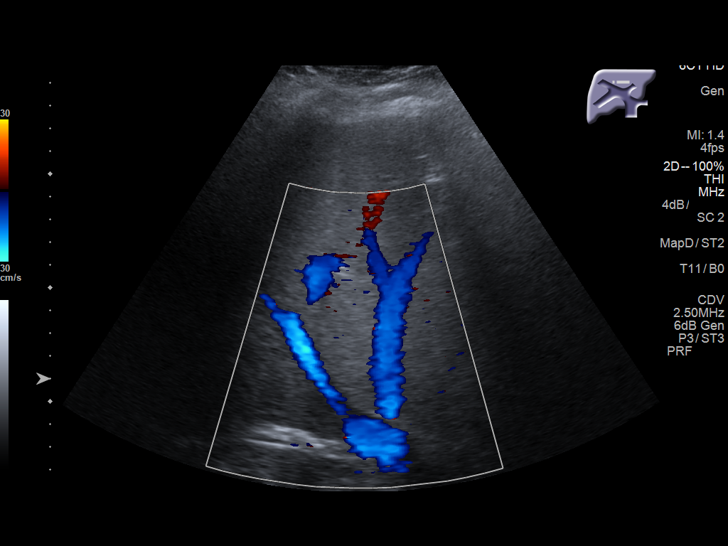
[im 37/45]
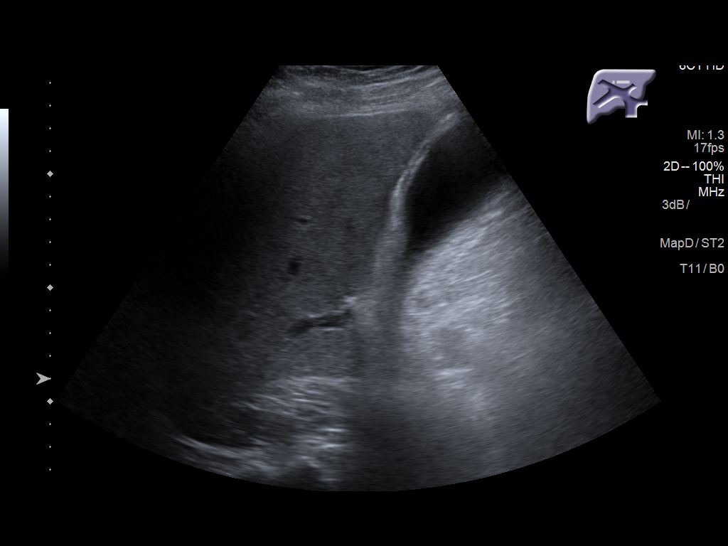
[im 41/45]
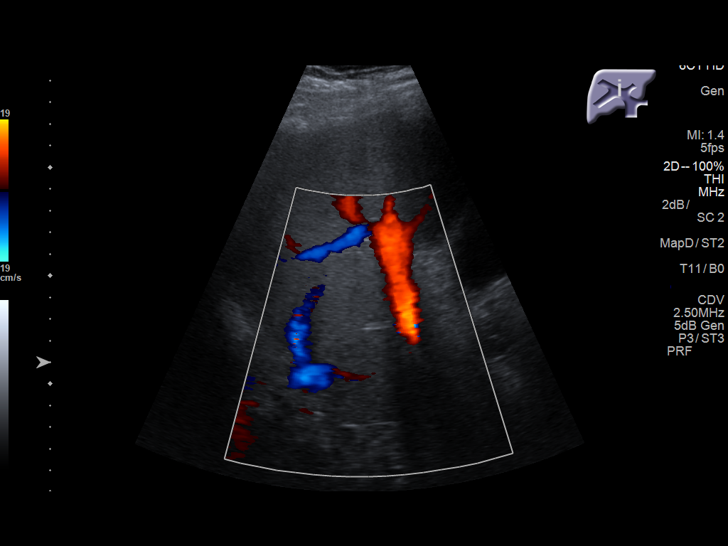
[im 45/45]
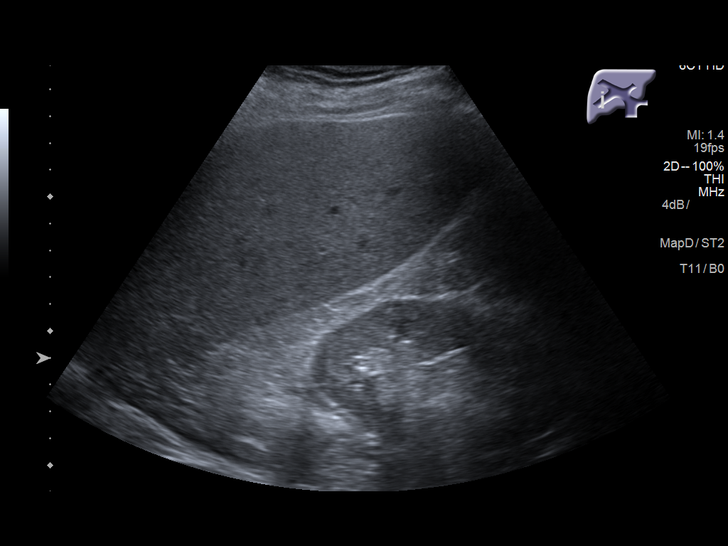

[14 of 25 positions shown; findings below may reference images not displayed]

FINDINGS: Gallbladder:

Mild focal gallbladder wall thickening up to 6 mm. No gallstones
visualized. No sonographic Murphy sign noted by sonographer.

Common bile duct:

Diameter: 4 mm, normal.

Liver:

No focal lesion identified. Within normal limits in parenchymal
echogenicity. Portal vein is patent on color Doppler imaging with
normal direction of blood flow towards the liver.
IMPRESSION: 1. Mild gallbladder wall thickening up to 6 mm, nonspecific. No
gallstones seen.

## 2018-07-20 ENCOUNTER — Ambulatory Visit (INDEPENDENT_AMBULATORY_CARE_PROVIDER_SITE_OTHER): Payer: Medicare Other | Admitting: Family Medicine

## 2018-07-20 ENCOUNTER — Encounter: Payer: Self-pay | Admitting: Family Medicine

## 2018-07-20 VITALS — BP 140/70 | HR 60 | Temp 98.6°F | Ht 68.0 in | Wt 219.4 lb

## 2018-07-20 DIAGNOSIS — R972 Elevated prostate specific antigen [PSA]: Secondary | ICD-10-CM | POA: Diagnosis not present

## 2018-07-20 DIAGNOSIS — N3941 Urge incontinence: Secondary | ICD-10-CM

## 2018-07-20 DIAGNOSIS — I10 Essential (primary) hypertension: Secondary | ICD-10-CM | POA: Diagnosis not present

## 2018-07-20 DIAGNOSIS — E1169 Type 2 diabetes mellitus with other specified complication: Secondary | ICD-10-CM

## 2018-07-20 DIAGNOSIS — Z8673 Personal history of transient ischemic attack (TIA), and cerebral infarction without residual deficits: Secondary | ICD-10-CM

## 2018-07-20 DIAGNOSIS — E669 Obesity, unspecified: Secondary | ICD-10-CM | POA: Diagnosis not present

## 2018-07-20 DIAGNOSIS — E785 Hyperlipidemia, unspecified: Secondary | ICD-10-CM

## 2018-07-20 LAB — POCT GLYCOSYLATED HEMOGLOBIN (HGB A1C): HEMOGLOBIN A1C: 6.4 % — AB (ref 4.0–5.6)

## 2018-07-20 LAB — POCT URINALYSIS DIPSTICK
GLUCOSE UA: POSITIVE — AB
KETONES UA: NEGATIVE
Leukocytes, UA: NEGATIVE
Nitrite, UA: NEGATIVE
Protein, UA: POSITIVE — AB
RBC UA: NEGATIVE
Spec Grav, UA: 1.025 (ref 1.010–1.025)
Urobilinogen, UA: 0.2 E.U./dL
pH, UA: 6 (ref 5.0–8.0)

## 2018-07-20 LAB — MICROALBUMIN / CREATININE URINE RATIO
Creatinine,U: 161.5 mg/dL
MICROALB UR: 1.5 mg/dL (ref 0.0–1.9)
Microalb Creat Ratio: 0.9 mg/g (ref 0.0–30.0)

## 2018-07-20 NOTE — Assessment & Plan Note (Signed)
Well-controlled.  Check A1c.

## 2018-07-20 NOTE — Assessment & Plan Note (Signed)
Continue Crestor 

## 2018-07-20 NOTE — Patient Instructions (Signed)
Nice to see you. We will check lab work today and contact you with the results. 

## 2018-07-20 NOTE — Assessment & Plan Note (Signed)
Patient saw urology.  It appears the urologist and the patient opted against further prostate cancer screening.

## 2018-07-20 NOTE — Assessment & Plan Note (Addendum)
Well-controlled.  Continue current regimen.  Patient will send [place] a my chart message to confirm whether or not he is taking Coreg or metoprolol.

## 2018-07-20 NOTE — Progress Notes (Signed)
  Tommi Rumps, MD Phone: [phone]  Guy Phillips is a 77 y.o. male who presents today for f/u.  CC: dm, htn, hld, history of stroke  HYPERTENSION Disease Monitoring: Blood pressure range-not checking Chest pain- no      Dyspnea- no Medications: Compliance- taking coreg or metoprolol (patient to confirm once he gets home), maxzide   Edema- no  DIABETES Disease Monitoring: Blood Sugar ranges-100-140 fasting Polyuria/phagia/dipsia- no      Optho- UTD Medications: Compliance- taking glipizide, metformin Hypoglycemic symptoms- no  HYPERLIPIDEMIA Disease Monitoring: See symptoms for Hypertension Medications: Compliance- taking crestor Right upper quadrant pain- no  Muscle aches- no  History of stroke: He notes no stroke symptoms.  No numbness or weakness.  Continues on aspirin.     Social History   Tobacco Use  Smoking Status Former Smoker  . Last attempt to quit: 07/03/1991  . Years since quitting: 27.0  Smokeless Tobacco Never Used     ROS see history of present illness  Objective  Physical Exam Vitals:   07/20/18 1330  BP: 140/70  Pulse: 60  Temp: 98.6 F (37 C)  SpO2: 96%    BP Readings from Last 3 Encounters:  07/20/18 140/70  05/08/18 133/67  04/27/18 132/64   Wt Readings from Last 3 Encounters:  07/20/18 219 lb 6.4 oz (99.5 kg)  05/08/18 216 lb 1.6 oz (98 kg)  04/27/18 217 lb (98.4 kg)    Physical Exam  Constitutional: No distress.  Cardiovascular: Normal rate, regular rhythm and normal heart sounds.  Pulmonary/Chest: Effort normal and breath sounds normal.  Musculoskeletal: He exhibits no edema.  Neurological: He is alert.  Skin: Skin is warm and dry. He is not diaphoretic.     Assessment/Plan: Please see individual problem list.  Hypertension Well-controlled.  Continue current regimen.  Patient will send [place] a my chart message to confirm whether or not he is taking Coreg or metoprolol.  Diabetes mellitus type 2 in obese  ([place]) Well-controlled.  Check A1c.  Elevated PSA Patient saw urology.  It appears the urologist and the patient opted against further prostate cancer screening.  History of CVA (cerebrovascular accident) No issues.  We will continue secondary prevention.  Hyperlipidemia Continue Crestor.   Orders Placed This Encounter  Procedures  . Urine Microalbumin w/creat. ratio  . POCT HgB A1C  . POCT Urinalysis Dipstick    No orders of the defined types were placed in this encounter.    Tommi Rumps, MD [place]

## 2018-07-20 NOTE — Assessment & Plan Note (Signed)
No issues.  We will continue secondary prevention.

## 2018-07-21 ENCOUNTER — Other Ambulatory Visit: Payer: Self-pay | Admitting: Family Medicine

## 2018-07-21 DIAGNOSIS — R809 Proteinuria, unspecified: Secondary | ICD-10-CM

## 2018-08-01 ENCOUNTER — Other Ambulatory Visit: Payer: Self-pay | Admitting: Family Medicine

## 2018-08-03 ENCOUNTER — Other Ambulatory Visit (INDEPENDENT_AMBULATORY_CARE_PROVIDER_SITE_OTHER): Payer: Medicare Other

## 2018-08-03 DIAGNOSIS — R809 Proteinuria, unspecified: Secondary | ICD-10-CM

## 2018-08-03 LAB — POCT URINALYSIS DIPSTICK
Bilirubin, UA: NEGATIVE
Blood, UA: NEGATIVE
Glucose, UA: POSITIVE — AB
LEUKOCYTES UA: NEGATIVE
NITRITE UA: NEGATIVE
PROTEIN UA: NEGATIVE
SPEC GRAV UA: 1.02 (ref 1.010–1.025)
UROBILINOGEN UA: 0.2 U/dL
pH, UA: 5 (ref 5.0–8.0)

## 2018-09-16 ENCOUNTER — Other Ambulatory Visit: Payer: Self-pay | Admitting: Family Medicine

## 2018-09-27 ENCOUNTER — Other Ambulatory Visit: Payer: Self-pay | Admitting: Family Medicine

## 2018-10-27 DIAGNOSIS — Z85828 Personal history of other malignant neoplasm of skin: Secondary | ICD-10-CM | POA: Diagnosis not present

## 2018-10-27 DIAGNOSIS — L570 Actinic keratosis: Secondary | ICD-10-CM | POA: Diagnosis not present

## 2018-10-27 DIAGNOSIS — L820 Inflamed seborrheic keratosis: Secondary | ICD-10-CM | POA: Diagnosis not present

## 2018-10-27 DIAGNOSIS — D225 Melanocytic nevi of trunk: Secondary | ICD-10-CM | POA: Diagnosis not present

## 2019-01-19 ENCOUNTER — Ambulatory Visit: Payer: Medicare Other | Admitting: Family Medicine

## 2019-01-29 ENCOUNTER — Other Ambulatory Visit: Payer: Self-pay

## 2019-01-29 ENCOUNTER — Encounter: Payer: Self-pay | Admitting: Family Medicine

## 2019-01-29 ENCOUNTER — Ambulatory Visit (INDEPENDENT_AMBULATORY_CARE_PROVIDER_SITE_OTHER): Payer: Medicare Other | Admitting: Family Medicine

## 2019-01-29 DIAGNOSIS — E785 Hyperlipidemia, unspecified: Secondary | ICD-10-CM

## 2019-01-29 DIAGNOSIS — I10 Essential (primary) hypertension: Secondary | ICD-10-CM

## 2019-01-29 DIAGNOSIS — E1169 Type 2 diabetes mellitus with other specified complication: Secondary | ICD-10-CM | POA: Diagnosis not present

## 2019-01-29 DIAGNOSIS — N3281 Overactive bladder: Secondary | ICD-10-CM

## 2019-01-29 DIAGNOSIS — E669 Obesity, unspecified: Secondary | ICD-10-CM

## 2019-01-29 NOTE — Progress Notes (Signed)
Virtual Visit via video note  This visit type was conducted due to national recommendations for restrictions regarding the COVID-19 pandemic (e.g. social distancing).  This format is felt to be most appropriate for this patient at this time.  All issues noted in this document were discussed and addressed.  No physical exam was performed (except for noted visual exam findings with Video Visits).   I connected with Guy Phillips today at  2:45 PM EDT by a video enabled telemedicine application and verified that I am speaking with the correct person using two identifiers. Location patient: home Location provider: work Persons participating in the virtual visit: patient, provider  I discussed the limitations, risks, security and privacy concerns of performing an evaluation and management service by telephone and the availability of in person appointments. I also discussed with the patient that there may be a patient responsible charge related to this service. The patient expressed understanding and agreed to proceed.  Reason for visit: f/u.  HPI: Diabetes: Last check sugar was 120.  Taking glipizide.  Taking metformin.  Some polyuria.  Occasional hypoglycemia if he misses a meal and takes his glipizide.  He has an appointment with ophthalmology at the end of May.  Hypertension: Not checking blood pressures.  He is taking metoprolol and Maxide.  No chest pain, shortness of breath, or edema.  Hyperlipidemia: Taking Crestor.  No right upper quadrant pain or myalgias.  Overactive bladder: Patient saw urology.  He notes there is no significant issue with this currently.  He does not want medication for this.  He urinates 3-5 times per day and 1 time per night.  This has been going on for several years and is stable.  He notes no dysuria.  He does empty his bladder.  No straining.  He does have some urgency occasionally.   ROS: See pertinent positives and negatives per HPI.  Past Medical  History:  Diagnosis Date  . CAD (coronary artery disease)    s/p MI 2011 Cath showed 100% occlusion of RCA with collaterals open, followed by Dr. Ubaldo Glassing  . Cerebrovascular accident (CVA) due to embolism of left middle cerebral artery ([place]) 06/29/2014   Overview:  Post op event 04/2014  . Dental bridge present    also caps and implants  . Diabetes mellitus Dx 2005   HgbA1c 6.8%, Optho [facility] 2001  . Hernia    Umbilical  . Hyperlipidemia   . Hypertension   . Kidney stone    2008  . 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 Evorn Gong  . Stroke ([place]) 05/20/2014   small left hemispheric cortical stroke following CABG.  No deficits  . Uses hearing aid    has.  Doesn't usually wear    Past Surgical History:  Procedure Laterality Date  . CHOLECYSTECTOMY N/A 08/23/2017   Procedure: LAPAROSCOPIC CHOLECYSTECTOMY;  Surgeon: Vickie Epley, MD;  Location: [facility];  Service: General;  Laterality: N/A;  . COLONOSCOPY WITH PROPOFOL N/A 04/28/2017   Procedure: COLONOSCOPY WITH PROPOFOL;  Surgeon: Lucilla Lame, MD;  Location: [place];  Service: Gastroenterology;  Laterality: N/A;  Diabetic - oral meds  . CORONARY ARTERY BYPASS GRAFT  2015   x3 vessels, with AVR  . EYE SURGERY     cataract extraction, bilateral  . FUNCTIONAL ENDOSCOPIC SINUS SURGERY     Dr. Fatima Sanger in [place]  . POLYPECTOMY  04/28/2017   Procedure: POLYPECTOMY;  Surgeon: Lucilla Lame, MD;  Location: [place];  Service: Gastroenterology;;    Family History  Problem Relation Age of Onset  . Cancer Father        liver    SOCIAL HX: Former smoker   Current Outpatient Medications:  .  acetaminophen (TYLENOL) 325 MG tablet, Take by mouth., Disp: , Rfl:  .  aspirin 81 MG tablet, Take 81 mg by mouth daily.  , Disp: , Rfl:  .  Blood Glucose Monitoring Suppl (ONE TOUCH ULTRA SYSTEM KIT) w/Device KIT, 1 kit by Does not apply route once., Disp: 1 each, Rfl: 0 .  Cholecalciferol (VITAMIN D3) 2000  UNITS TABS, Take 1 tablet by mouth daily.  , Disp: , Rfl:  .  fexofenadine (ALLEGRA) 180 MG tablet, Take 180 mg by mouth daily., Disp: , Rfl:  .  fluticasone (FLONASE) 50 MCG/ACT nasal spray, Place into the nose Daily., Disp: , Rfl:  .  glipiZIDE (GLUCOTROL) 5 MG tablet, TAKE 1 TABLET BY MOUTH TWICE A DAY BEFORE MEALS, Disp: 180 tablet, Rfl: 3 .  glucose blood (ONE TOUCH ULTRA TEST) test strip, USE AS INSTRUCTED, Disp: 300 each, Rfl: 4 .  metFORMIN (GLUCOPHAGE) 1000 MG tablet, TAKE 1 TABLET TWICE DAILY  WITH MEALS, Disp: 180 tablet, Rfl: 1 .  metoprolol tartrate (LOPRESSOR) 25 MG tablet, Take 12.5 mg by mouth daily. Take half tablet in the am and half in pm , Disp: , Rfl:  .  Multiple Vitamin (MULTIVITAMIN) tablet, Take 1 tablet by mouth daily.  , Disp: , Rfl:  .  nitroGLYCERIN (NITROSTAT) 0.4 MG SL tablet, Place 1 tablet (0.4 mg total) under the tongue every 5 (five) minutes as needed., Disp: 30 tablet, Rfl: 1 .  rosuvastatin (CRESTOR) 20 MG tablet, TAKE 1 TABLET DAILY, Disp: 90 tablet, Rfl: 2 .  triamterene-hydrochlorothiazide (MAXZIDE-25) 37.5-25 MG tablet, Take 1 tablet by mouth 1 day or 1 dose., Disp: , Rfl: 11  EXAM:  VITALS per patient if applicable: None.  GENERAL: alert, oriented, appears well and in no acute distress  HEENT: atraumatic, conjunttiva clear, no obvious abnormalities on inspection of external nose and ears  NECK: normal movements of the head and neck  LUNGS: on inspection no signs of respiratory distress, breathing rate appears normal, no obvious gross SOB, gasping or wheezing  CV: no obvious cyanosis  MS: moves all visible extremities without noticeable abnormality  PSYCH/NEURO: pleasant and cooperative, no obvious depression or anxiety, speech and thought processing grossly intact  ASSESSMENT AND PLAN:  Discussed the following assessment and plan:  Essential hypertension  Diabetes mellitus type 2 in obese ([place]) - Plan: Hemoglobin A1c  Hyperlipidemia,  unspecified hyperlipidemia type - Plan: Lipid panel, Comp Met (CMET)  OAB (overactive bladder)  Hypertension BP check to be completed in 1 month with nursing at the time of patient's lab work.  Continue current medication.  Diabetes mellitus type 2 in obese [facility]) Continue current medication.  Check A1c.  Advised that if he is going to take the glipizide he needs to eat consistently.  Hyperlipidemia Continue Crestor.  Check lipid panel.  OAB (overactive bladder) Discussed timed voiding.  Patient declines medication.  He will monitor.   Social distancing precautions and sick precautions given regarding COVID-19.   I discussed the assessment and treatment plan with the patient. The patient was provided an opportunity to ask questions and all were answered. The patient agreed with the plan and demonstrated an understanding of the instructions.   The patient was advised to call back or  seek an in-person evaluation if the symptoms worsen or if the condition fails to improve as anticipated.   Tommi Rumps, MD

## 2019-01-30 ENCOUNTER — Telehealth: Payer: Self-pay | Admitting: Family Medicine

## 2019-01-30 DIAGNOSIS — N3281 Overactive bladder: Secondary | ICD-10-CM | POA: Insufficient documentation

## 2019-01-30 NOTE — Assessment & Plan Note (Signed)
Continue current medication.  Check A1c.  Advised that if he is going to take the glipizide he needs to eat consistently.

## 2019-01-30 NOTE — Telephone Encounter (Signed)
Please contact the patient and get him set up for follow-up in 6 months.  He also needs lab work and a nurse visit for a blood pressure check in 1 month.  Thanks.

## 2019-01-30 NOTE — Assessment & Plan Note (Signed)
Discussed timed voiding.  Patient declines medication.  He will monitor.

## 2019-01-30 NOTE — Assessment & Plan Note (Signed)
Continue Crestor. Check lipid panel.  

## 2019-01-30 NOTE — Assessment & Plan Note (Signed)
BP check to be completed in 1 month with nursing at the time of patient's lab work.  Continue current medication.

## 2019-02-01 NOTE — Telephone Encounter (Signed)
Called and spoke with pt. Pt has been scheduled. Done.

## 2019-02-10 ENCOUNTER — Other Ambulatory Visit: Payer: Self-pay | Admitting: Family Medicine

## 2019-03-02 ENCOUNTER — Ambulatory Visit (INDEPENDENT_AMBULATORY_CARE_PROVIDER_SITE_OTHER): Payer: Medicare Other

## 2019-03-02 ENCOUNTER — Other Ambulatory Visit (INDEPENDENT_AMBULATORY_CARE_PROVIDER_SITE_OTHER): Payer: Medicare Other

## 2019-03-02 ENCOUNTER — Other Ambulatory Visit: Payer: Self-pay

## 2019-03-02 DIAGNOSIS — E669 Obesity, unspecified: Secondary | ICD-10-CM | POA: Diagnosis not present

## 2019-03-02 DIAGNOSIS — I10 Essential (primary) hypertension: Secondary | ICD-10-CM | POA: Diagnosis not present

## 2019-03-02 DIAGNOSIS — E1169 Type 2 diabetes mellitus with other specified complication: Secondary | ICD-10-CM | POA: Diagnosis not present

## 2019-03-02 DIAGNOSIS — E785 Hyperlipidemia, unspecified: Secondary | ICD-10-CM | POA: Diagnosis not present

## 2019-03-02 LAB — LIPID PANEL
Cholesterol: 137 mg/dL (ref 0–200)
HDL: 35.4 mg/dL — ABNORMAL LOW (ref >39.00)
LDL Cholesterol: 70 mg/dL (ref 0–99)
NonHDL: 101.98
Total CHOL/HDL Ratio: 4
Triglycerides: 162 mg/dL — ABNORMAL HIGH (ref 0.0–149.0)
VLDL: 32.4 mg/dL (ref 0.0–40.0)

## 2019-03-02 LAB — COMPREHENSIVE METABOLIC PANEL
ALT: 13 U/L (ref 0–53)
AST: 15 U/L (ref 0–37)
Albumin: 4.2 g/dL (ref 3.5–5.2)
Alkaline Phosphatase: 71 U/L (ref 39–117)
BUN: 17 mg/dL (ref 6–23)
CO2: 31 mEq/L (ref 19–32)
Calcium: 9.6 mg/dL (ref 8.4–10.5)
Chloride: 100 mEq/L (ref 96–112)
Creatinine, Ser: 1.1 mg/dL (ref 0.40–1.50)
GFR: 64.97 mL/min (ref >60.00)
Glucose, Bld: 116 mg/dL — ABNORMAL HIGH (ref 70–99)
Potassium: 3.9 mEq/L (ref 3.5–5.1)
Sodium: 140 mEq/L (ref 135–145)
Total Bilirubin: 1 mg/dL (ref 0.2–1.2)
Total Protein: 6.5 g/dL (ref 6.0–8.3)

## 2019-03-02 LAB — HEMOGLOBIN A1C: Hgb A1c MFr Bld: 7.2 % — ABNORMAL HIGH (ref 4.6–6.5)

## 2019-03-02 NOTE — Progress Notes (Signed)
atient came in today for BP check. BP was checked in left arm with large cuff. BP read 128/62.

## 2019-03-04 NOTE — Progress Notes (Signed)
Patients BP is acceptable. No changes to be made. Please let him know.

## 2019-03-05 NOTE — Progress Notes (Signed)
Patient notified verbalized understanding

## 2019-03-10 ENCOUNTER — Other Ambulatory Visit: Payer: Self-pay | Admitting: Family Medicine

## 2019-03-29 ENCOUNTER — Telehealth: Payer: Self-pay | Admitting: Cardiovascular Disease

## 2019-03-29 NOTE — Telephone Encounter (Signed)

## 2019-03-30 ENCOUNTER — Other Ambulatory Visit: Payer: Self-pay

## 2019-03-30 ENCOUNTER — Ambulatory Visit (INDEPENDENT_AMBULATORY_CARE_PROVIDER_SITE_OTHER): Payer: Medicare Other | Admitting: Cardiovascular Disease

## 2019-03-30 ENCOUNTER — Encounter: Payer: Self-pay | Admitting: Cardiovascular Disease

## 2019-03-30 VITALS — BP 140/74 | HR 78 | Temp 97.0°F | Ht 68.0 in | Wt 207.8 lb

## 2019-03-30 DIAGNOSIS — E785 Hyperlipidemia, unspecified: Secondary | ICD-10-CM | POA: Diagnosis not present

## 2019-03-30 DIAGNOSIS — I10 Essential (primary) hypertension: Secondary | ICD-10-CM

## 2019-03-30 DIAGNOSIS — I359 Nonrheumatic aortic valve disorder, unspecified: Secondary | ICD-10-CM | POA: Diagnosis not present

## 2019-03-30 DIAGNOSIS — I2510 Atherosclerotic heart disease of native coronary artery without angina pectoris: Secondary | ICD-10-CM

## 2019-03-30 NOTE — Progress Notes (Signed)
Cardiology Office Note   Date:  03/30/2019   ID:  Guy Phillips, DOB [birth year], MRN [id number]  PCP:  Leone Haven, MD  Cardiologist:   Kathlyn Sacramento, MD   Chief Complaint  Patient presents with  . other    Self ref to establish care for CAD; s/p CABG x 3 & aortic valve replacement 2015 at [facility]. Meds reviewed by the pt. verbally. "doing well."       History of Present Illness: Guy Phillips is a 78 y.o. male who presents to establish cardiovascular care.  He was previously followed at [facility] cardiology. He is status post CABG and AVR with Dr. Norm Parcel on 05/19/2014. This was complicated by left hemispheric cortical stroke . He had a bioprosthetic AVR placed. A follow-up echo revealed normal AV valve function, severe mitral annular calcifications are normal ejection fraction.   He had cholecystitis in November 2018 and underwent laparoscopic cholecystectomy.  Other medical problems include diabetes mellitus, hypertension, obesity and hyperlipidemia.  He has been doing well with no recent chest pain, shortness of breath or palpitations.   Past Medical History:  Diagnosis Date  . CAD (coronary artery disease)    s/p MI 2011 Cath showed 100% occlusion of RCA with collaterals open, followed by Dr. Ubaldo Glassing  . Cerebrovascular accident (CVA) due to embolism of left middle cerebral artery ([place]) 06/29/2014   Overview:  Post op event 04/2014  . Dental bridge present    also caps and implants  . Diabetes mellitus Dx 2005   HgbA1c 6.8%, Optho [facility] 2001  . Hernia    Umbilical  . Hyperlipidemia   . Hypertension   . Kidney stone    2008  . 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 Evorn Gong  . Stroke ([place]) 05/20/2014   small left hemispheric cortical stroke following CABG.  No deficits  . Uses hearing aid    has.  Doesn't usually wear    Past Surgical History:  Procedure Laterality Date  . CHOLECYSTECTOMY N/A 08/23/2017   Procedure: LAPAROSCOPIC  CHOLECYSTECTOMY;  Surgeon: Vickie Epley, MD;  Location: [facility] ORS;  Service: General;  Laterality: N/A;  . COLONOSCOPY WITH PROPOFOL N/A 04/28/2017   Procedure: COLONOSCOPY WITH PROPOFOL;  Surgeon: Lucilla Lame, MD;  Location: [place];  Service: Gastroenterology;  Laterality: N/A;  Diabetic - oral meds  . CORONARY ARTERY BYPASS GRAFT  2015   x3 vessels, with AVR  . EYE SURGERY     cataract extraction, bilateral  . FUNCTIONAL ENDOSCOPIC SINUS SURGERY     Dr. Fatima Sanger in [place]  . POLYPECTOMY  04/28/2017   Procedure: POLYPECTOMY;  Surgeon: Lucilla Lame, MD;  Location: [place];  Service: Gastroenterology;;     Current Outpatient Medications  Medication Sig Dispense Refill  . acetaminophen (TYLENOL) 325 MG tablet Take by mouth.    Marland Kitchen aspirin 81 MG tablet Take 81 mg by mouth daily.      . Blood Glucose Monitoring Suppl (ONE TOUCH ULTRA SYSTEM KIT) w/Device KIT 1 kit by Does not apply route once. 1 each 0  . Cholecalciferol (VITAMIN D3) 2000 UNITS TABS Take 1 tablet by mouth daily.      . fexofenadine (ALLEGRA) 180 MG tablet Take 180 mg by mouth daily.    . fluticasone (FLONASE) 50 MCG/ACT nasal spray Place into the nose Daily.    Marland Kitchen glipiZIDE (GLUCOTROL) 5 MG tablet TAKE 1 TABLET BY MOUTH TWICE A DAY  BEFORE MEALS 180 tablet 3  . glucose blood (ONE TOUCH ULTRA TEST) test strip USE AS INSTRUCTED 300 each 4  . metFORMIN (GLUCOPHAGE) 1000 MG tablet TAKE 1 TABLET TWICE DAILY  WITH MEALS 180 tablet 1  . metoprolol tartrate (LOPRESSOR) 25 MG tablet Take 12.5 mg by mouth daily. Take half tablet in the am and half in pm     . Multiple Vitamin (MULTIVITAMIN) tablet Take 1 tablet by mouth daily.      . nitroGLYCERIN (NITROSTAT) 0.4 MG SL tablet Place 1 tablet (0.4 mg total) under the tongue every 5 (five) minutes as needed. 30 tablet 1  . rosuvastatin (CRESTOR) 20 MG tablet TAKE 1 TABLET DAILY 90 tablet 2  . triamterene-hydrochlorothiazide (MAXZIDE-25) 37.5-25 MG tablet Take 1 tablet by  mouth 1 day or 1 dose.  11   No current facility-administered medications for this visit.     Allergies:   Grapefruit extract, Other, and Pollen extract    Social History:  The patient  reports that he quit smoking about 27 years ago. He has never used smokeless tobacco. He reports current alcohol use. He reports that he does not use drugs.   Family History:  The patient's family history includes Cancer in his father.    ROS:  Please see the history of present illness.   Otherwise, review of systems are positive for none.   All other systems are reviewed and negative.    PHYSICAL EXAM: VS:  BP 140/74 (BP Location: Right Arm, Patient Position: Sitting, Cuff Size: Normal)   Pulse 78   Temp (!) 97 F (36.1 C)   Ht _0  (1.727 m)   Wt 207 lb 12 oz (94.2 kg)   SpO2 98%   BMI 31.59 kg/m  , BMI Body mass index is 31.59 kg/m. GEN: Well nourished, well developed, in no acute distress  HEENT: normal  Neck: no JVD, carotid bruits, or masses Cardiac: RRR; no  rubs, or gallops,no edema .  1 out of 6 systolic murmur in the aortic area. Respiratory:  clear to auscultation bilaterally, normal work of breathing GI: soft, nontender, nondistended, + BS MS: no deformity or atrophy  Skin: warm and dry, no rash Neuro:  Strength and sensation are intact Psych: euthymic mood, full affect   EKG:  EKG is ordered today. The ekg ordered today demonstrates normal sinus rhythm with first-degree AV block and nonspecific IVCD.   Recent Labs: 03/02/2019: ALT 13; BUN 17; Creatinine, Ser 1.10; Potassium 3.9; Sodium 140    Lipid Panel    Component Value Date/Time   CHOL 137 03/02/2019 0901   TRIG 162.0 (H) 03/02/2019 0901   HDL 35.40 (L) 03/02/2019 0901   CHOLHDL 4 03/02/2019 0901   VLDL 32.4 03/02/2019 0901   LDLCALC 70 03/02/2019 0901   LDLDIRECT 79.0 04/26/2016 0909      Wt Readings from Last 3 Encounters:  03/30/19 207 lb 12 oz (94.2 kg)  07/20/18 219 lb 6.4 oz (99.5 kg)  05/08/18 216  lb 1.6 oz (98 kg)       PAD Screen 03/30/2019  Previous PAD dx? No  Previous surgical procedure? No  Pain with walking? No  Feet/toe relief with dangling? No  Painful, non-healing ulcers? No  Extremities discolored? No      ASSESSMENT AND PLAN:  1.  Coronary artery disease involving native coronary arteries without angina: He is status post CABG with no anginal symptoms.  Continue medical therapy.  2.  Aortic valve disease status  post bioprosthetic aortic valve replacement: Recommend repeat echocardiogram in the next 6 to 12 months.  3.  Essential hypertension: Blood pressure is controlled on current medications.  4.  Hyperlipidemia: Currently on rosuvastatin 20 mg once daily.  Most recent lipid profile showed an LDL of 70.    Disposition:   FU with me in 6 months  Signed,  Kathlyn Sacramento, MD  03/30/2019 2:42 PM    [place]

## 2019-03-30 NOTE — Patient Instructions (Signed)
Medication Instructions:  - Your physician recommends that you continue on your current medications as directed. Please refer to the Current Medication list given to you today.  If you need a refill on your cardiac medications before your next appointment, please call your pharmacy.   Lab work: - none ordered  If you have labs (blood work) drawn today and your tests are completely normal, you will receive your results only by: Marland Kitchen MyChart Message (if you have MyChart) OR . A paper copy in the mail If you have any lab test that is abnormal or we need to change your treatment, we will call you to review the results.  Testing/Procedures: - none ordered  Follow-Up: At [facility], you and your health needs are our priority.  As part of our continuing mission to provide you with exceptional heart care, we have created designated Provider Care Teams.  These Care Teams include your primary Cardiologist (physician) and Advanced Practice Providers (APPs -  Physician Assistants and Nurse Practitioners) who all work together to provide you with the care you need, when you need it. You will need a follow up appointment in 6 months (January 2021).  Please call our office 2 months in advance to schedule this appointment. (call in early November 2020 to schedule)   You may see Kathlyn Sacramento, MD or one of the following Advanced Practice Providers on your designated Care Team:   Murray Hodgkins, NP Christell Faith, PA-C . Marrianne Mood, PA-C  Any Other Special Instructions Will Be Listed Below (If Applicable). - N/A

## 2019-04-27 DIAGNOSIS — Z85828 Personal history of other malignant neoplasm of skin: Secondary | ICD-10-CM | POA: Diagnosis not present

## 2019-04-27 DIAGNOSIS — L821 Other seborrheic keratosis: Secondary | ICD-10-CM | POA: Diagnosis not present

## 2019-04-27 DIAGNOSIS — D225 Melanocytic nevi of trunk: Secondary | ICD-10-CM | POA: Diagnosis not present

## 2019-04-27 DIAGNOSIS — L570 Actinic keratosis: Secondary | ICD-10-CM | POA: Diagnosis not present

## 2019-05-28 ENCOUNTER — Other Ambulatory Visit: Payer: Self-pay

## 2019-05-28 ENCOUNTER — Ambulatory Visit (INDEPENDENT_AMBULATORY_CARE_PROVIDER_SITE_OTHER): Payer: Medicare Other

## 2019-05-28 DIAGNOSIS — Z23 Encounter for immunization: Secondary | ICD-10-CM | POA: Diagnosis not present

## 2019-06-01 ENCOUNTER — Ambulatory Visit (INDEPENDENT_AMBULATORY_CARE_PROVIDER_SITE_OTHER): Payer: Medicare Other | Admitting: Family Medicine

## 2019-06-01 ENCOUNTER — Other Ambulatory Visit: Payer: Self-pay

## 2019-06-01 ENCOUNTER — Encounter: Payer: Self-pay | Admitting: Family Medicine

## 2019-06-01 VITALS — BP 120/80 | HR 79 | Temp 96.1°F | Ht 68.0 in | Wt 205.6 lb

## 2019-06-01 DIAGNOSIS — R972 Elevated prostate specific antigen [PSA]: Secondary | ICD-10-CM

## 2019-06-01 DIAGNOSIS — R634 Abnormal weight loss: Secondary | ICD-10-CM | POA: Diagnosis not present

## 2019-06-01 DIAGNOSIS — I2510 Atherosclerotic heart disease of native coronary artery without angina pectoris: Secondary | ICD-10-CM

## 2019-06-01 DIAGNOSIS — R61 Generalized hyperhidrosis: Secondary | ICD-10-CM

## 2019-06-01 DIAGNOSIS — Z114 Encounter for screening for human immunodeficiency virus [HIV]: Secondary | ICD-10-CM

## 2019-06-01 NOTE — Patient Instructions (Signed)
Nice to see you. We will get lab work to evaluate for a cause of your weight loss.  We will contact you with the results.

## 2019-06-01 NOTE — Progress Notes (Signed)
Tommi Rumps, MD Phone: [phone]  Guy Phillips is a 78 y.o. male who presents today for follow-up.  Weight loss/night sweats/elevated PSA: Patient notes over the last 6 or so months he has lost about 20 pounds without trying.  His waist is gone from 42 inches to 38 inches.  He has lost his appetite and food just does not taste all that good.  He does get full little easier.  He notes no abdominal pain, nausea, vomiting, diarrhea, cough, fevers, itching, rash, new medications, new supplements, depression, or anxiety.  He does note some night sweats the last 6 to 8 months.  Typically occurs around his neck and does cause him to flip over his pillow.  No excessive drenching night sweats.  They do occur most nights.  He denies pain anywhere in his body.  He does have a history of an elevated PSA though he saw urology and they discussed not following up on this at this time given his age.  Social History   Tobacco Use  Smoking Status Former Smoker  . Quit date: 07/03/1991  . Years since quitting: 27.9  Smokeless Tobacco Never Used     ROS see history of present illness  Objective  Physical Exam Vitals:   06/01/19 1453  BP: 120/80  Pulse: 79  Temp: (!) 96.1 F (35.6 C)  SpO2: 96%    BP Readings from Last 3 Encounters:  06/01/19 120/80  03/30/19 140/74  07/20/18 140/70   Wt Readings from Last 3 Encounters:  06/01/19 205 lb 9.6 oz (93.3 kg)  03/30/19 207 lb 12 oz (94.2 kg)  07/20/18 219 lb 6.4 oz (99.5 kg)    Physical Exam Constitutional:      General: He is not in acute distress.    Appearance: He is not diaphoretic.  HENT:     Head: Normocephalic and atraumatic.  Eyes:     Conjunctiva/sclera: Conjunctivae normal.     Pupils: Pupils are equal, round, and reactive to light.  Neck:     Thyroid: No thyroid mass, thyromegaly or thyroid tenderness.  Cardiovascular:     Rate and Rhythm: Normal rate and regular rhythm.     Heart sounds: Normal heart sounds.   Pulmonary:     Effort: Pulmonary effort is normal.     Breath sounds: Normal breath sounds.  Abdominal:     General: Bowel sounds are normal. There is no distension.     Palpations: Abdomen is soft.     Tenderness: There is no abdominal tenderness. There is no guarding or rebound.  Musculoskeletal:     Right lower leg: No edema.     Left lower leg: No edema.  Lymphadenopathy:     Head:     Right side of head: No submental, submandibular or posterior auricular adenopathy.     Left side of head: No submental, submandibular or posterior auricular adenopathy.     Cervical: No cervical adenopathy.     Upper Body:     Right upper body: No supraclavicular or axillary adenopathy.     Left upper body: No supraclavicular or axillary adenopathy.     Lower Body: No right inguinal adenopathy. No left inguinal adenopathy.  Skin:    General: Skin is warm and dry.  Neurological:     Mental Status: He is alert.  Psychiatric:        Mood and Affect: Mood normal.      Assessment/Plan: Please see individual problem list.  Elevated PSA History of  this in the past.  He did see urology and a joint decision was made not to follow-up on this in the future.  We will recheck a PSA given his weight loss and night sweats.  Night sweats These are relatively focal in nature.  This could be related to his weight loss though could be related to some other cause.  Will evaluate with lab work.  Weight loss Patient has progressively lost some weight over the last 6 or so months.  This is concerning given that he has not been trying.  He does have some decreased appetite which could account for the weight loss.  He has a benign exam today.  Will obtain lab work to start with to evaluate for a potential cause.  He will follow-up in 1 month.   Orders Placed This Encounter  Procedures  . Comp Met (CMET)  . TSH  . CBC w/Diff  . Sedimentation rate  . PSA  . HIV antibody (with reflex)  . QuantiFERON-TB Gold  Plus    No orders of the defined types were placed in this encounter.    Tommi Rumps, MD [place]

## 2019-06-02 LAB — CBC WITH DIFFERENTIAL/PLATELET
Basophils Absolute: 0.1 10*3/uL (ref 0.0–0.1)
Basophils Relative: 0.8 % (ref 0.0–3.0)
Eosinophils Absolute: 0.6 10*3/uL (ref 0.0–0.7)
Eosinophils Relative: 6.2 % — ABNORMAL HIGH (ref 0.0–5.0)
HCT: 43.8 % (ref 39.0–52.0)
Hemoglobin: 14.6 g/dL (ref 13.0–17.0)
Lymphocytes Relative: 27.9 % (ref 12.0–46.0)
Lymphs Abs: 2.5 10*3/uL (ref 0.7–4.0)
MCHC: 33.4 g/dL (ref 30.0–36.0)
MCV: 93.7 fl (ref 78.0–100.0)
Monocytes Absolute: 0.9 10*3/uL (ref 0.1–1.0)
Monocytes Relative: 9.6 % (ref 3.0–12.0)
Neutro Abs: 5 10*3/uL (ref 1.4–7.7)
Neutrophils Relative %: 55.5 % (ref 43.0–77.0)
Platelets: 211 10*3/uL (ref 150.0–400.0)
RBC: 4.68 Mil/uL (ref 4.22–5.81)
RDW: 13 % (ref 11.5–15.5)
WBC: 9.1 10*3/uL (ref 4.0–10.5)

## 2019-06-02 LAB — COMPREHENSIVE METABOLIC PANEL
ALT: 15 U/L (ref 0–53)
AST: 15 U/L (ref 0–37)
Albumin: 4.5 g/dL (ref 3.5–5.2)
Alkaline Phosphatase: 66 U/L (ref 39–117)
BUN: 18 mg/dL (ref 6–23)
CO2: 27 mEq/L (ref 19–32)
Calcium: 9.8 mg/dL (ref 8.4–10.5)
Chloride: 101 mEq/L (ref 96–112)
Creatinine, Ser: 0.94 mg/dL (ref 0.40–1.50)
GFR: 77.84 mL/min (ref >60.00)
Glucose, Bld: 64 mg/dL — ABNORMAL LOW (ref 70–99)
Potassium: 3.8 mEq/L (ref 3.5–5.1)
Sodium: 140 mEq/L (ref 135–145)
Total Bilirubin: 0.9 mg/dL (ref 0.2–1.2)
Total Protein: 6.9 g/dL (ref 6.0–8.3)

## 2019-06-02 LAB — PSA: PSA: 8.97 ng/mL — ABNORMAL HIGH (ref 0.10–4.00)

## 2019-06-02 LAB — SEDIMENTATION RATE: Sed Rate: 22 mm/hr — ABNORMAL HIGH (ref 0–20)

## 2019-06-02 LAB — TSH: TSH: 1.4 u[IU]/mL (ref 0.35–4.50)

## 2019-06-03 DIAGNOSIS — R61 Generalized hyperhidrosis: Secondary | ICD-10-CM | POA: Insufficient documentation

## 2019-06-03 LAB — QUANTIFERON-TB GOLD PLUS

## 2019-06-03 LAB — HIV ANTIBODY (ROUTINE TESTING W REFLEX): HIV 1&2 Ab, 4th Generation: NONREACTIVE

## 2019-06-03 NOTE — Assessment & Plan Note (Signed)
These are relatively focal in nature.  This could be related to his weight loss though could be related to some other cause.  Will evaluate with lab work.

## 2019-06-03 NOTE — Assessment & Plan Note (Signed)
History of this in the past.  He did see urology and a joint decision was made not to follow-up on this in the future.  We will recheck a PSA given his weight loss and night sweats.

## 2019-06-03 NOTE — Assessment & Plan Note (Signed)
Patient has progressively lost some weight over the last 6 or so months.  This is concerning given that he has not been trying.  He does have some decreased appetite which could account for the weight loss.  He has a benign exam today.  Will obtain lab work to start with to evaluate for a potential cause.  He will follow-up in 1 month.

## 2019-06-08 ENCOUNTER — Telehealth: Payer: Self-pay

## 2019-06-08 NOTE — Telephone Encounter (Signed)
Pt. Called back and given results and instructions. Is willing to re-do TB screening. Please contact pt. With details. Wants to think about Urology referral.

## 2019-06-09 NOTE — Telephone Encounter (Signed)
Called and spoke with the patient and gave instructions on how we will draw for TB screening and lab was scheduled. , pt understood.  Acasia Skilton,cma

## 2019-06-11 ENCOUNTER — Telehealth: Payer: Self-pay | Admitting: *Deleted

## 2019-06-11 DIAGNOSIS — R634 Abnormal weight loss: Secondary | ICD-10-CM

## 2019-06-11 DIAGNOSIS — R61 Generalized hyperhidrosis: Secondary | ICD-10-CM

## 2019-06-11 NOTE — Telephone Encounter (Signed)
Called the patient and gave lab results and patient states he is willing to see a urologist but he will search around and find his own, I informed him that when he does to got to his mychart and send a message to you with the urologist name and address, pt understood.  Guy Phillips,cma

## 2019-06-11 NOTE — Telephone Encounter (Signed)
Please place future orders for lab appt.  

## 2019-06-11 NOTE — Telephone Encounter (Signed)
Ordered

## 2019-06-11 NOTE — Telephone Encounter (Signed)
Noted. Please call him and let him know that I would really encourage him to see urology with his increasing PSA and his weight loss and night sweat symptoms. He could have prostate cancer that is contributing.

## 2019-06-14 ENCOUNTER — Other Ambulatory Visit (INDEPENDENT_AMBULATORY_CARE_PROVIDER_SITE_OTHER): Payer: Medicare Other

## 2019-06-14 ENCOUNTER — Encounter: Payer: Self-pay | Admitting: Family Medicine

## 2019-06-14 ENCOUNTER — Other Ambulatory Visit: Payer: Self-pay

## 2019-06-14 DIAGNOSIS — R61 Generalized hyperhidrosis: Secondary | ICD-10-CM | POA: Diagnosis not present

## 2019-06-14 DIAGNOSIS — R634 Abnormal weight loss: Secondary | ICD-10-CM

## 2019-06-14 NOTE — Telephone Encounter (Signed)
Noted.  Please follow-up with the patient to see if he has chosen a urologist.  We can refer him back to the same person he saw in August of last year or we can refer him to someone else though he would need to go to [place] as the closest locations.

## 2019-06-15 ENCOUNTER — Encounter: Payer: Self-pay | Admitting: Family Medicine

## 2019-06-15 DIAGNOSIS — R972 Elevated prostate specific antigen [PSA]: Secondary | ICD-10-CM

## 2019-06-15 NOTE — Telephone Encounter (Signed)
Patient sent you the urologist he prefers in a mychart message that I sent to you.  Winslow Ederer,cma

## 2019-06-15 NOTE — Telephone Encounter (Signed)
Referral placed in mychart encounter.

## 2019-06-16 LAB — QUANTIFERON-TB GOLD PLUS
Mitogen-NIL: >10 IU/mL
NIL: 0.03 IU/mL
QuantiFERON-TB Gold Plus: NEGATIVE
TB1-NIL: 0.01 IU/mL
TB2-NIL: 0 IU/mL

## 2019-06-22 ENCOUNTER — Encounter: Payer: Self-pay | Admitting: Family Medicine

## 2019-07-15 ENCOUNTER — Other Ambulatory Visit: Payer: Self-pay | Admitting: Family Medicine

## 2019-07-19 ENCOUNTER — Other Ambulatory Visit: Payer: Self-pay

## 2019-07-19 DIAGNOSIS — R972 Elevated prostate specific antigen [PSA]: Secondary | ICD-10-CM | POA: Diagnosis not present

## 2019-07-21 ENCOUNTER — Ambulatory Visit (INDEPENDENT_AMBULATORY_CARE_PROVIDER_SITE_OTHER): Payer: Medicare Other | Admitting: Family Medicine

## 2019-07-21 ENCOUNTER — Other Ambulatory Visit: Payer: Self-pay

## 2019-07-21 ENCOUNTER — Ambulatory Visit (INDEPENDENT_AMBULATORY_CARE_PROVIDER_SITE_OTHER): Payer: Medicare Other

## 2019-07-21 ENCOUNTER — Encounter: Payer: Self-pay | Admitting: Family Medicine

## 2019-07-21 VITALS — BP 120/80 | HR 73 | Temp 97.2°F | Ht 68.0 in | Wt 212.8 lb

## 2019-07-21 DIAGNOSIS — E1169 Type 2 diabetes mellitus with other specified complication: Secondary | ICD-10-CM | POA: Diagnosis not present

## 2019-07-21 DIAGNOSIS — E669 Obesity, unspecified: Secondary | ICD-10-CM | POA: Diagnosis not present

## 2019-07-21 DIAGNOSIS — I2510 Atherosclerotic heart disease of native coronary artery without angina pectoris: Secondary | ICD-10-CM | POA: Diagnosis not present

## 2019-07-21 DIAGNOSIS — I10 Essential (primary) hypertension: Secondary | ICD-10-CM

## 2019-07-21 DIAGNOSIS — R972 Elevated prostate specific antigen [PSA]: Secondary | ICD-10-CM

## 2019-07-21 DIAGNOSIS — Z0000 Encounter for general adult medical examination without abnormal findings: Secondary | ICD-10-CM | POA: Diagnosis not present

## 2019-07-21 LAB — MICROALBUMIN / CREATININE URINE RATIO
Creatinine,U: 50.8 mg/dL
Microalb Creat Ratio: 1.4 mg/g (ref 0.0–30.0)
Microalb, Ur: <0.7 mg/dL (ref 0.0–1.9)

## 2019-07-21 LAB — POCT GLYCOSYLATED HEMOGLOBIN (HGB A1C): Hemoglobin A1C: 6.6 % — AB (ref 4.0–5.6)

## 2019-07-21 NOTE — Assessment & Plan Note (Addendum)
He will complete his evaluation through urology.  Weight has trended up and sweats have improved.  He will monitor and will see how they progress after his evaluation through urology.  Weight loss sweats could potentially be related to prostate cancer if he indeed has that.

## 2019-07-21 NOTE — Progress Notes (Signed)
  Tommi Rumps, MD Phone: [phone]  Guy Phillips is a 78 y.o. male who presents today for f/u.  DIABETES Disease Monitoring: Blood Sugar ranges-120-130 Polyuria/phagia/dipsia- no      Optho- due, has an appointment in December Medications: Compliance- taking glipizide, metformin Hypoglycemic symptoms- no  HYPERTENSION  Disease Monitoring  Home BP Monitoring not checking Chest pain- no    Dyspnea- no Medications  Compliance-  Taking triamterene/HCTZ, metoprolol.   Edema- no  Elevated PSA: Patient saw urology.  They are planning on a biopsy in November.  He notes his weight has actually started to trend up and the night sweats have improved significantly since his last visit.  He saw Dr. Tammi Klippel at [facility] urology.     Social History   Tobacco Use  Smoking Status Former Smoker  . Quit date: 07/03/1991  . Years since quitting: 28.0  Smokeless Tobacco Never Used     ROS see history of present illness  Objective  Physical Exam Vitals:   07/21/19 1059 07/21/19 1130  BP: (!) 150/70 120/80  Pulse: 73   Temp: (!) 97.2 F (36.2 C)   SpO2: 97%     BP Readings from Last 3 Encounters:  07/21/19 120/80  06/01/19 120/80  03/30/19 140/74   Wt Readings from Last 3 Encounters:  07/21/19 212 lb 12.8 oz (96.5 kg)  06/01/19 205 lb 9.6 oz (93.3 kg)  03/30/19 207 lb 12 oz (94.2 kg)    Physical Exam Constitutional:      General: He is not in acute distress.    Appearance: He is not diaphoretic.  Cardiovascular:     Rate and Rhythm: Normal rate and regular rhythm.     Heart sounds: Normal heart sounds.  Pulmonary:     Effort: Pulmonary effort is normal.     Breath sounds: Normal breath sounds.  Musculoskeletal:     Right lower leg: No edema.     Left lower leg: No edema.  Skin:    General: Skin is warm and dry.  Neurological:     Mental Status: He is alert.    Diabetic Foot Exam - Simple   Simple Foot Form Diabetic Foot exam was performed with the  following findings: Yes 07/21/2019 11:28 AM  Visual Inspection No deformities, no ulcerations, no other skin breakdown bilaterally: Yes Sensation Testing See comments: Yes Pulse Check Posterior Tibialis and Dorsalis pulse intact bilaterally: Yes Comments Bilateral feet with decreased light touch sensation and slightly decreased monofilament sensation throughout      Assessment/Plan: Please see individual problem list.  Elevated PSA He will complete his evaluation through urology.  Weight has trended up and sweats have improved.  He will monitor and will see how they progress after his evaluation through urology.  Weight loss sweats could potentially be related to prostate cancer if he indeed has that.  Diabetes mellitus type 2 in obese [facility]) Continue current regimen.  A1c checked today.  Urine microalbumin checked today.  Foot exam completed.  Hypertension Improved on recheck.  He will continue his current regimen.    Orders Placed This Encounter  Procedures  . Urine Microalbumin w/creat. ratio  . POCT HgB A1C    No orders of the defined types were placed in this encounter.    Tommi Rumps, MD [place]

## 2019-07-21 NOTE — Assessment & Plan Note (Signed)
Improved on recheck.  He will continue his current regimen.

## 2019-07-21 NOTE — Patient Instructions (Signed)
Nice to see you. Please continue with your current diabetic regimen. Please complete your work-up through urology. Please monitor your weight and the sweats.  If you start to lose weight again or the sweats recur please let us know.

## 2019-07-21 NOTE — Patient Instructions (Addendum)
  Guy Phillips , Thank you for taking time to come for your Medicare Wellness Visit. I appreciate your ongoing commitment to your health goals. Please review the following plan we discussed and let me know if I can assist you in the future.   These are the goals we discussed: Goals    . Maintain Healthy Lifestyle     Stay hydrated Stay active Healthy diet Brain engagement activity       This is a list of the screening recommended for you and due dates:  Health Maintenance  Topic Date Due  . Eye exam for diabetics  01/06/2019  . Complete foot exam   03/19/2019  . Urine Protein Check  07/21/2019  . Hemoglobin A1C  09/01/2019  . Tetanus Vaccine  10/07/2021  . Colon Cancer Screening  04/28/2022  . Flu Shot  Completed  . Pneumonia vaccines  Completed

## 2019-07-21 NOTE — Progress Notes (Signed)
Subjective:   Guy Phillips is a 78 y.o. male who presents for Medicare Annual/Subsequent preventive examination.  Review of Systems:  No ROS.  Medicare Wellness Virtual Visit.  Visual/audio telehealth visit, UTA vital signs.   See social history for additional risk factors.   Cardiac Risk Factors include: advanced age (>91mn, >>14women);male gender;hypertension;diabetes mellitus     Objective:    Vitals: There were no vitals taken for this visit.  There is no height or weight on file to calculate BMI.  Advanced Directives 07/21/2019 04/27/2018 08/21/2017 08/21/2017 04/28/2017 04/25/2017 04/24/2016  Does Patient Have a Medical Advance Directive? Yes Yes Yes Yes Yes Yes Yes  Type of AParamedicof AAtlantic BeachLiving will HH[place]Living will HGarden CityLiving will HEkalakaLiving will H[place]Living will HTrentonLiving will HMarshallLiving will  Does patient want to make changes to medical advance directive? No - Patient declined No - Patient declined No - Patient declined - No - Patient declined No - Patient declined -  Copy of HLecomptein Chart? No - copy requested No - copy requested No - copy requested Yes No - copy requested No - copy requested No - copy requested    Tobacco Social History   Tobacco Use  Smoking Status Former Smoker  . Quit date: 07/03/1991  . Years since quitting: 28.0  Smokeless Tobacco Never Used     Counseling given: Not Answered   Clinical Intake:  Pre-visit preparation completed: Yes        Diabetes: Yes(Followed by pcp)           Past Medical History:  Diagnosis Date  . CAD (coronary artery disease)    s/p MI 2011 Cath showed 100% occlusion of RCA with collaterals open, followed by Dr. FUbaldo Glassing . Cerebrovascular accident (CVA) due to embolism of left middle cerebral artery  [place] 06/[place]2014   Overview:  Post op event 04/2014  . Dental bridge present    also caps and implants  . Diabetes mellitus Dx 2005   HgbA1c 6.8%, Optho [facility] 2001  . Hernia    Umbilical  . Hyperlipidemia   . Hypertension   . Kidney stone    2008  . 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 DEvorn Gong . Stroke ([place] 05/20/2014   small left hemispheric cortical stroke following CABG.  No deficits  . Uses hearing aid    has.  Doesn't usually wear   Past Surgical History:  Procedure Laterality Date  . CHOLECYSTECTOMY N/A 08/23/2017   Procedure: LAPAROSCOPIC CHOLECYSTECTOMY;  Surgeon: DVickie Epley MD;  Location: [facility] ORS;  Service: General;  Laterality: N/A;  . COLONOSCOPY WITH PROPOFOL N/A 04/28/2017   Procedure: COLONOSCOPY WITH PROPOFOL;  Surgeon: WLucilla Lame MD;  [facility]  Service: Gastroenterology;  Laterality: N/A;  Diabetic - oral meds  . CORONARY ARTERY BYPASS GRAFT  2015   x3 vessels, with AVR  . EYE SURGERY     cataract extraction, bilateral  . FUNCTIONAL ENDOSCOPIC SINUS SURGERY     Dr. [place]Fatima Sangerin [place] . POLYPECTOMY  04/28/2017   Procedure: POLYPECTOMY;  Surgeon: WLucilla Lame MD;  [facility]  Service: Gastroenterology;;   Family History  Problem Relation Age of Onset  . Cancer Father        liver   Social History   Socioeconomic  History  . Marital status: Married    Spouse name: Not on file  . Number of children: Not on file  . Years of education: Not on file  . Highest education level: Not on file  Occupational History  . Not on file  Social Needs  . Financial resource strain: Not hard at all  . Food insecurity    Worry: Never true    Inability: Never true  . Transportation needs    Medical: No    Non-medical: No  Tobacco Use  . Smoking status: Former Smoker    Quit date: 07/03/1991    Years since quitting: 28.0  . Smokeless tobacco: Never Used  Substance and Sexual Activity  .  Alcohol use: Yes    Comment: 1 glass of wine occ  . Drug use: No  . Sexual activity: Never  Lifestyle  . Physical activity    Days per week: 0 days    Minutes per session: Not on file  . Stress: Not at all  Relationships  . Social Herbalist on phone: Not on file    Gets together: Not on file    Attends religious service: Not on file    Active member of club or organization: Not on file    Attends meetings of clubs or organizations: Not on file    Relationship status: Not on file  Other Topics Concern  . Not on file  Social History Narrative  . Not on file    Outpatient Encounter Medications as of 07/21/2019  Medication Sig  . acetaminophen (TYLENOL) 325 MG tablet Take by mouth.  Marland Kitchen aspirin 81 MG tablet Take 81 mg by mouth daily.    . Blood Glucose Monitoring Suppl (ONE TOUCH ULTRA SYSTEM KIT) w/Device KIT 1 kit by Does not apply route once.  . Cholecalciferol (VITAMIN D3) 2000 UNITS TABS Take 1 tablet by mouth daily.    . fexofenadine (ALLEGRA) 180 MG tablet Take 180 mg by mouth daily.  . fluticasone (FLONASE) 50 MCG/ACT nasal spray Place into the nose Daily.  Marland Kitchen glipiZIDE (GLUCOTROL) 5 MG tablet TAKE 1 TABLET BY MOUTH TWICE A DAY BEFORE MEALS  . glucose blood (ONE TOUCH ULTRA TEST) test strip USE AS INSTRUCTED  . metFORMIN (GLUCOPHAGE) 1000 MG tablet TAKE 1 TABLET TWICE DAILY  WITH MEALS  . metoprolol tartrate (LOPRESSOR) 25 MG tablet Take 12.5 mg by mouth daily. Take half tablet in the am and half in pm   . Multiple Vitamin (MULTIVITAMIN) tablet Take 1 tablet by mouth daily.    . nitroGLYCERIN (NITROSTAT) 0.4 MG SL tablet Place 1 tablet (0.4 mg total) under the tongue every 5 (five) minutes as needed.  . rosuvastatin (CRESTOR) 20 MG tablet TAKE 1 TABLET DAILY  . triamterene-hydrochlorothiazide (MAXZIDE-25) 37.5-25 MG tablet Take 1 tablet by mouth 1 day or 1 dose.  . tamsulosin (FLOMAX) 0.4 MG CAPS capsule Take 0.4 mg by mouth daily.   No facility-administered  encounter medications on file as of 07/21/2019.     Activities of Daily Living In your present state of health, do you have any difficulty performing the following activities: 07/21/2019  Hearing? N  Vision? N  Difficulty concentrating or making decisions? N  Walking or climbing stairs? N  Dressing or bathing? N  Doing errands, shopping? N  Preparing Food and eating ? N  Using the Toilet? N  In the past six months, have you accidently leaked urine? Y  Do you have problems with  loss of bowel control? N  Managing your Medications? N  Managing your Finances? N  Housekeeping or managing your Housekeeping? N  Some recent data might be hidden    Patient Care Team: Leone Haven, MD as PCP - General (Family Medicine)   Assessment:   This is a routine wellness examination for [place].  Nurse connected with patient 07/21/19 at  9:00 AM EDT by a telephone enabled telemedicine application and verified that I am speaking with the correct person using two identifiers. Patient stated full name and DOB. Patient gave permission to continue with virtual visit. Patient's location was at home and Nurse's location was at [place] office.   Health Maintenance Due: -Eye Exam- scheduled 08/31/19 -Foot Exam- followed by pcp -Hgb A1c- 03/02/19 (7.2) Update all pending maintenance due as appropriate.   See completed HM at the end of note.   Eye: Visual acuity not assessed. Virtual visit. Wears corrective lenses. Followed by their ophthalmologist. Retinopathy- none reported  Dental: Visits every 6 months Dentures- partial  Hearing: Demonstrates normal hearing during visit.  Safety:  Patient feels safe at home- yes Patient does have smoke detectors at home- yes Patient does wear sunscreen or protective clothing when in direct sunlight - yes Patient does wear seat belt when in a moving vehicle - yes Patient drives- yes Adequate lighting in walkways free from debris- yes Grab bars and  handrails used as appropriate- yes Ambulates with no assistive device Cell phone on person when ambulating outside of the home- yes  Social: Alcohol intake - yes      Smoking history- former    Smokers in home? none Illicit drug use? none  Depression: PHQ 2 &9 complete. See screening below. Denies irritability, anhedonia, sadness/tearfullness.    Falls: See screening below.    Medication: Taking as directed and without issues.   Covid-19: Precautions and sickness symptoms discussed. Wears mask, social distancing, hand hygiene as appropriate.   Activities of Daily Living Patient denies needing assistance with: household chores, feeding themselves, getting from bed to chair, getting to the toilet, bathing/showering, dressing, managing money, or preparing meals.    Memory: Patient is alert. Correctly identified the president of the [place], season and recall. Patient likes to read and stay up to date with current events for brain stimulation.   BMI- discussed the importance of a healthy diet, water intake and the benefits of aerobic exercise.  Educational material provided.  Physical activity- walking, no routine  Diet:  Low carb Water: fair intake Caffeine: 1-2 cups of coffee  Other Providers Patient Care Team: Leone Haven, MD as PCP - General (Family Medicine)  Exercise Activities and Dietary recommendations Current Exercise Habits: Home exercise routine, Type of exercise: walking, Intensity: Mild  Goals    . Maintain Healthy Lifestyle     Stay hydrated Stay active Healthy diet Brain engagement activity       Fall Risk Fall Risk  07/21/2019 06/01/2019 07/20/2018 04/27/2018 04/25/2017  Falls in the past year? 0 0 No No No  Number falls in past yr: - 0 - - -  Follow up - Falls evaluation completed - - -   Timed Get Up and Go Performed: no, virtual visit  Depression Screen PHQ 2/9 Scores 07/21/2019 06/01/2019 07/20/2018 04/27/2018  PHQ - 2 Score 0 0 0 0     Cognitive Function MMSE - Mini Mental State Exam 04/27/2018 04/25/2017 04/24/2016 04/24/2015  Orientation to time '5 5 5 5  '$ Orientation to Place 5 5  5 5  Registration '3 3 3 3  '$ Attention/ Calculation '5 5 5 5  '$ Recall '2 3 3 3  '$ Language- name 2 objects '2 2 2 2  '$ Language- repeat '1 1 1 1  '$ Language- follow 3 step command '3 3 3 3  '$ Language- read & follow direction '1 1 1 1  '$ Write a sentence '1 1 1 1  '$ Copy design '1 1 1 1  '$ Total score '29 30 30 30     '$ 6CIT Screen 07/21/2019  What Year? 0 points  What month? 0 points  What time? 0 points  Count back from 20 0 points  Months in reverse 0 points  Repeat phrase 0 points  Total Score 0    Immunization History  Administered Date(s) Administered  . Fluad Quad(high Dose 65+) 05/28/2019  . Influenza Split 07/03/2011, 06/16/2012  . Influenza, High Dose Seasonal PF 06/04/2016, 05/20/2017, 05/24/2018, 06/18/2018  . Influenza,inj,Quad PF,6+ Mos 06/16/2013, 07/01/2014, 06/28/2015  . Influenza-Unspecified 06/28/2014  . Pneumococcal Conjugate-13 12/28/2013  . Pneumococcal Polysaccharide-23 10/08/2011  . Tdap 10/08/2011  . Zoster 07/29/2008   Screening Tests Health Maintenance  Topic Date Due  . OPHTHALMOLOGY EXAM  01/06/2019  . FOOT EXAM  03/19/2019  . URINE MICROALBUMIN  07/21/2019  . HEMOGLOBIN A1C  09/01/2019  . TETANUS/TDAP  10/07/2021  . COLONOSCOPY  04/28/2022  . INFLUENZA VACCINE  Completed  . PNA vac Low Risk Adult  Completed       Plan:   Keep all routine maintenance appointments.   Follow up 07/21/19 @ 11:00  Medicare Attestation I have personally reviewed: The patient's medical and social history Their use of alcohol, tobacco or illicit drugs Their current medications and supplements The patient's functional ability including ADLs,fall risks, home safety risks, cognitive, and hearing and visual impairment Diet and physical activities Evidence for depression   In addition, I have reviewed and discussed with patient certain  preventive protocols, quality metrics, and best practice recommendations. A written personalized care plan for preventive services as well as general preventive health recommendations were provided to patient via mail.     Varney Biles, LPN  [id number]

## 2019-07-21 NOTE — Assessment & Plan Note (Signed)
Continue current regimen.  A1c checked today.  Urine microalbumin checked today.  Foot exam completed.

## 2019-08-09 ENCOUNTER — Ambulatory Visit: Payer: Medicare Other | Admitting: Family Medicine

## 2019-08-24 DIAGNOSIS — R972 Elevated prostate specific antigen [PSA]: Secondary | ICD-10-CM | POA: Diagnosis not present

## 2019-08-31 DIAGNOSIS — E119 Type 2 diabetes mellitus without complications: Secondary | ICD-10-CM | POA: Diagnosis not present

## 2019-08-31 DIAGNOSIS — H35371 Puckering of macula, right eye: Secondary | ICD-10-CM | POA: Diagnosis not present

## 2019-08-31 LAB — HM DIABETES EYE EXAM

## 2019-09-20 NOTE — Progress Notes (Signed)
GU Location of Tumor / Histology: Prostatic adenocarcinoma  If Prostate Cancer, Gleason Score is (3 + 4) and PSA is (8.97). Prostate volume: 97 grams.  Guy Phillips had a PSA of 7.2 in 2019. Unfortunately, PSA increased in 05/2019 to 8.97 and patient reported unexplained weight loss.   Biopsies of prostate (if applicable) revealed:   Past/Anticipated interventions by urology, if any: Prescribed tamsulosin (patient denies taking it), prostate biopsy, referral for consideration of radiotherapy  Past/Anticipated interventions by medical oncology, if any: no  Weight changes, if any: yes, thirty pound weight loss over a couple years plus an additional 40 lb weight loss in the last month. Patient explains he has no appetite.  Bowel/Bladder complaints, if any: Reports urinary frequency, urgency and nocturia. Reports urinary leakage. Denies any bowel complaints. IPSS 10. SHIM 5.   Nausea/Vomiting, if any: denies  Pain issues, if any:  Reports low back pain in the AM until he "gets moving around and stretched out."   SAFETY ISSUES:  Prior radiation? denies  Pacemaker/ICD? denies  Possible current pregnancy? no, male patient  Is the patient on methotrexate? denies  Current Complaints / other details:  78 year old male. Married with 1 son. Former smoker. Father passed at 40 with cancer of the liver.Retired Health and safety inspector. Moved from [place] to [place] to be closer to his grandkids.

## 2019-09-21 ENCOUNTER — Other Ambulatory Visit: Payer: Self-pay

## 2019-09-21 ENCOUNTER — Ambulatory Visit
Admission: RE | Admit: 2019-09-21 | Discharge: 2019-09-21 | Disposition: A | Discharge status: Home / Self Care | Payer: Medicare Other | Source: Ambulatory Visit | Attending: Radiation Oncology | Admitting: Radiation Oncology

## 2019-09-21 ENCOUNTER — Encounter: Payer: Self-pay | Admitting: Radiation Oncology

## 2019-09-21 VITALS — Ht 68.0 in | Wt 205.0 lb

## 2019-09-21 DIAGNOSIS — C61 Malignant neoplasm of prostate: Secondary | ICD-10-CM

## 2019-09-21 HISTORY — DX: Malignant neoplasm of prostate: C61

## 2019-09-21 NOTE — Progress Notes (Signed)
Radiation Oncology         [phone]) [phone] ________________________________  Initial outpatient Consultation - Conducted via MyChart due to current COVID-19 concerns for limiting patient exposure  Name: Guy Phillips MRN: [id number]  Date: 09/21/2019  DOB: 1941-06-07  CC:Leone Haven, MD  Alexis Frock, MD   REFERRING PHYSICIAN: Alexis Frock, MD  DIAGNOSIS: 78 y.o. gentleman with Stage T1c adenocarcinoma of the prostate with Gleason score of 3+4, and PSA of 8.97.    ICD-10-CM   1. Malignant neoplasm of prostate ([place])  C61     HISTORY OF PRESENT ILLNESS: Guy Phillips is a 78 y.o. male with a diagnosis of prostate cancer. He was noted to have an elevated PSA of 8.97 by his primary care physician, Dr. Caryl Bis.  Prior PSA in 2019 was 7.2.  Accordingly, he was referred for evaluation in urology by Dr. Tresa Moore on 07/19/2019,  digital rectal examination was performed at that time revealing no nodules. He was started on tamsulosin at that time for LUTS. He reports today, however, that he never took this because he did not feel that his LUTS were bothersome enough to warrant medication. The patient proceeded to transrectal ultrasound with 12 biopsies of the prostate on 08/24/2019.  The prostate volume measured 97 cc.  Out of 12 core biopsies, 7 were positive.  The maximum Gleason score was 3+4, and this was seen in the left apex lateral, right apex, and right apex lateral (with perineural invasion). Additionally, Gleason 3+3 was seen in the right mid lateral (small focus, with PNI), right mid (small focus), left mid, and left apex.  The patient reviewed the biopsy results with his urologist and he has kindly been referred today for discussion of potential radiation treatment options.   PREVIOUS RADIATION THERAPY: No  PAST MEDICAL HISTORY:  Past Medical History:  Diagnosis Date   Acute cholecystitis 08/21/2017   CAD (coronary artery disease)    s/p MI 2011 Cath showed  100% occlusion of RCA with collaterals open, followed by Dr. Ubaldo Glassing   Cerebrovascular accident (CVA) due to embolism of left middle cerebral artery ([place]) 06/29/2014   Overview:  Post op event 04/2014   Conjunctival cyst of left eye 06/26/2012   Dental bridge present    also caps and implants   Diabetes mellitus Dx 2005   HgbA1c 6.8%, [place] 2001   Hernia    Umbilical   Hyperlipidemia    Hypertension    Kidney stone    2008   Prostate cancer ([place])    Squamous cell cancer of scalp and skin of neck    [facility]   Stroke [facility]) 05/20/2014   small left hemispheric cortical stroke following CABG.  No deficits   Uses hearing aid    has.  Doesn't usually wear      PAST SURGICAL HISTORY: Past Surgical History:  Procedure Laterality Date   CHOLECYSTECTOMY N/A 08/23/2017   Procedure: LAPAROSCOPIC CHOLECYSTECTOMY;  Surgeon: Vickie Epley, MD;  Location: [facility] ORS;  Service: General;  Laterality: N/A;   COLONOSCOPY WITH PROPOFOL N/A 04/28/2017   Procedure: COLONOSCOPY WITH PROPOFOL;  Surgeon: Lucilla Lame, MD;  Location: [place];  Service: Gastroenterology;  Laterality: N/A;  Diabetic - oral meds   CORONARY ARTERY BYPASS GRAFT  2015   x3 vessels, with AVR   EYE SURGERY     cataract extraction, bilateral   FUNCTIONAL ENDOSCOPIC SINUS SURGERY     Dr. Fatima Sanger in [place]   POLYPECTOMY  04/28/2017   Procedure: POLYPECTOMY;  Surgeon: Lucilla Lame, MD;  Location: [place];  Service: Gastroenterology;;    FAMILY HISTORY:  Family History  Problem Relation Age of Onset   Cancer Father        liver   Colon cancer Neg Hx    Prostate cancer Neg Hx    Pancreatic cancer Neg Hx    Breast cancer Neg Hx     SOCIAL HISTORY:  Social History   Socioeconomic History   Marital status: Married    Spouse name: Not on file   Number of children: Not on file   Years of education: Not on file   Highest education level: Not on file    Occupational History   Not on file  Tobacco Use   Smoking status: Former Smoker    Packs/day: 3.00    Years: 33.00    Pack years: 99.00    Types: Cigarettes    Quit date: 07/03/1991    Years since quitting: 28.2   Smokeless tobacco: Never Used  Substance and Sexual Activity   Alcohol use: Yes    Comment: 1 glass of wine occ   Drug use: No   Sexual activity: Not Currently  Other Topics Concern   Not on file  Social History Narrative   Not on file   Social Determinants of Health   Financial Resource Strain: Low Risk    Difficulty of Paying Living Expenses: Not hard at all  Food Insecurity: No Food Insecurity   Worried About Charity fundraiser in the Last Year: Never true   [place] in the Last Year: Never true  Transportation Needs: No Transportation Needs   Lack of Transportation (Medical): No   Lack of Transportation (Non-Medical): No  Physical Activity: Unknown   Days of Exercise per Week: 0 days   Minutes of Exercise per Session: Not on file  Stress: No Stress Concern Present   Feeling of Stress : Not at all  Social Connections:    Frequency of Communication with Friends and Family: Not on file   Frequency of Social Gatherings with Friends and Family: Not on file   Attends Religious Services: Not on Electrical engineer or Organizations: Not on file   Attends Archivist Meetings: Not on file   Marital Status: Not on file  Intimate Partner Violence: Not At Risk   Fear of Current or Ex-Partner: No   Emotionally Abused: No   Physically Abused: No   Sexually Abused: No    ALLERGIES: Grapefruit extract, Other, and Pollen extract  MEDICATIONS:  Current Outpatient Medications  Medication Sig Dispense Refill   aspirin 81 MG tablet Take 81 mg by mouth daily.       Blood Glucose Monitoring Suppl (ONE TOUCH ULTRA 2) w/Device KIT USE AS DIRECTED     Blood Glucose Monitoring Suppl (ONE TOUCH ULTRA SYSTEM KIT)  w/Device KIT 1 kit by Does not apply route once. 1 each 0   Cholecalciferol (VITAMIN D3) 2000 UNITS TABS Take 1 tablet by mouth daily.       Cyanocobalamin (B-12) 5000 MCG CAPS Take by mouth.     fexofenadine (ALLEGRA) 180 MG tablet Take 180 mg by mouth daily.     fluticasone (FLONASE) 50 MCG/ACT nasal spray Place into the nose Daily.     glipiZIDE (GLUCOTROL) 5 MG tablet TAKE 1 TABLET BY MOUTH TWICE A DAY BEFORE MEALS 180 tablet 3   glucose  blood (ONE TOUCH ULTRA TEST) test strip USE AS INSTRUCTED 300 each 4   glucose blood test strip CHECK BLOOD SUGAR ONCE A DAY     metFORMIN (GLUCOPHAGE) 1000 MG tablet TAKE 1 TABLET TWICE DAILY  WITH MEALS 180 tablet 1   metoprolol tartrate (LOPRESSOR) 25 MG tablet Take 12.5 mg by mouth daily. Take half tablet in the am and half in pm      Multiple Vitamin (MULTIVITAMIN) tablet Take 1 tablet by mouth daily.       rosuvastatin (CRESTOR) 20 MG tablet TAKE 1 TABLET DAILY 90 tablet 2   triamterene-hydrochlorothiazide (MAXZIDE-25) 37.5-25 MG tablet Take 1 tablet by mouth 1 day or 1 dose.  11   acetaminophen (TYLENOL) 325 MG tablet Take by mouth.     nitroGLYCERIN (NITROSTAT) 0.4 MG SL tablet Place 1 tablet (0.4 mg total) under the tongue every 5 (five) minutes as needed. (Patient not taking: Reported on 09/21/2019) 30 tablet 1   tamsulosin (FLOMAX) 0.4 MG CAPS capsule Take 0.4 mg by mouth daily.     No current facility-administered medications for this encounter.    REVIEW OF SYSTEMS:  On review of systems, the patient reports that he is doing well overall. He denies any chest pain, shortness of breath, cough, fevers, chills, night sweats. He denies any bowel disturbances, and denies abdominal pain, nausea or vomiting. He denies any new musculoskeletal or joint aches or pains. His IPSS was 10, indicating moderate urinary symptoms. He reports urinary frequency, urgency, nocturia, and urinary leakage. His SHIM was 5, indicating he has severe erectile  dysfunction. A complete review of systems is obtained and is otherwise negative.    PHYSICAL EXAM:  Wt Readings from Last 3 Encounters:  09/21/19 205 lb (93 kg)  07/21/19 212 lb 12.8 oz (96.5 kg)  06/01/19 205 lb 9.6 oz (93.3 kg)   Temp Readings from Last 3 Encounters:  07/21/19 (!) 97.2 F (36.2 C) (Temporal)  06/01/19 (!) 96.1 F (35.6 C) (Temporal)  03/30/19 (!) 97 F (36.1 C)   BP Readings from Last 3 Encounters:  07/21/19 120/80  06/01/19 120/80  03/30/19 140/74   Pulse Readings from Last 3 Encounters:  07/21/19 73  06/01/19 79  03/30/19 78   Pain Assessment Pain Score: 0-No pain/10  In general this is a well appearing Caucasian gentleman in no acute distress. He's alert and oriented x4 and appropriate throughout the examination. Cardiopulmonary assessment is negative for acute distress and he exhibits normal effort.    KPS = 90  100 - Normal; no complaints; no evidence of disease. 90   - Able to carry on normal activity; minor signs or symptoms of disease. 80   - Normal activity with effort; some signs or symptoms of disease. 15   - Cares for self; unable to carry on normal activity or to do active work. 60   - Requires occasional assistance, but is able to care for most of his personal needs. 50   - Requires considerable assistance and frequent medical care. 84   - Disabled; requires special care and assistance. 67   - Severely disabled; hospital admission is indicated although death not imminent. 13   - Very sick; hospital admission necessary; active supportive treatment necessary. 10   - Moribund; fatal processes progressing rapidly. 0     - Dead  Karnofsky DA, Abelmann WH, Craver LS and Burchenal [facility] [phone]) The use of the nitrogen mustards in the palliative treatment of carcinoma: with particular reference to  bronchogenic carcinoma Cancer 1 634-56  LABORATORY DATA:  Lab Results  Component Value Date   WBC 9.1 06/01/2019   HGB 14.6 06/01/2019   HCT 43.8  06/01/2019   MCV 93.7 06/01/2019   PLT 211.0 06/01/2019   Lab Results  Component Value Date   NA 140 06/01/2019   K 3.8 06/01/2019   CL 101 06/01/2019   CO2 27 06/01/2019   Lab Results  Component Value Date   ALT 15 06/01/2019   AST 15 06/01/2019   ALKPHOS 66 06/01/2019   BILITOT 0.9 06/01/2019     RADIOGRAPHY: No results found.    IMPRESSION/PLAN: This visit was conducted via MyChart to spare the patient unnecessary potential exposure in the healthcare setting during the current COVID-19 pandemic. 1. 78 y.o. gentleman with Stage T1c adenocarcinoma of the prostate with Gleason Score of 3+4, and PSA of 8.97. We discussed the patient's workup and outlined the nature of prostate cancer in this setting. The patient's T stage, Gleason's score, and PSA put him into the unfavorable intermediate risk group. Accordingly, he is eligible for a variety of potential treatment options including ST-ADT concurrent with either brachytherapy or 5.5 weeks of external radiation. We discussed the available radiation techniques, and focused on the details and logistics of delivery. The patient is not an ideal candidate for brachytherapy with a prostate volume of 97 gm. We therefore focused our discussion on prostate IMRT and outlined the risks, benefits, short and long-term effects associated with radiotherapy and compared and contrasted these with prostatectomy. We discussed the role of SpaceOAR in reducing the rectal toxicity associated with radiotherapy. We also detailed the role of ADT in the treatment of unfavorable intermediate risk prostate cancer and outlined the associated side effects that could be expected with this therapy.  We explained the rationale behind the intentional delay of start of daily radiation for approximately 2 months after starting ADT to allow for the radiosensitizing effects of this therapy.  He was encouraged to ask questions that were answered to his stated satisfaction.  At the  end of the conversation, the patient is interested in moving forward with 5.5 weeks of external beam therapy concurrent with ADT. He has not received his first Lupron injection. We will share our discussion with Dr. Tresa Moore and move forward with coordinating a follow up appointment, first available, for start of ADT now.  We will also work to coordinate for fiducial markers and SpaceOAR gel placement to reduce rectal toxicity from radiotherapy, prior to simulation in early March 2021, in anticipation of beginning his daily radiation treatments shortly thereafter. He appears to have a good understanding of his disease and our treatment recommendations which are of curative intent and is comfortable and in agreement with the stated plan.  Given current concerns for patient exposure during the COVID-19 pandemic, this encounter was conducted via video-enabled MyChart visit. The patient has given verbal consent for this type of encounter. The time spent during this encounter was 60 minutes. The attendants for this meeting include Tyler Pita MD, Ashlyn Bruning PA-C, Katie Daubenspeck- scribe, patient, JOASH TONY and his wife and daughter. During the encounter, Tyler Pita MD, Ashlyn Bruning PA-C, and scribe, Wilburn Mylar were located at [employer].  Patient, KEDDRICK WYNE, his wife, and his daughter were located at home.    Nicholos Johns, PA-C    Tyler Pita, MD  [facility] Direct Dial: [phone]   Fax: [phone]  [email or web address]   Skype   [email or web address]   This document serves as a record of services personally performed by Tyler Pita, MD and Freeman Caldron, PA-C. It was created on their behalf by Wilburn Mylar, a trained medical scribe. The creation of this record is based on the scribe's personal observations and the provider's statements to them. This document has been checked and approved  by the attending provider.

## 2019-09-23 ENCOUNTER — Telehealth: Payer: Self-pay | Admitting: *Deleted

## 2019-09-23 NOTE — Telephone Encounter (Signed)
CALLED PATIENT TO INFORM OF ADT APPT. FOR 09/30/19 @ 11 AM, LVM FOR A RETURN CALL

## 2019-09-30 DIAGNOSIS — N200 Calculus of kidney: Secondary | ICD-10-CM | POA: Diagnosis not present

## 2019-09-30 DIAGNOSIS — C61 Malignant neoplasm of prostate: Secondary | ICD-10-CM | POA: Diagnosis not present

## 2019-09-30 DIAGNOSIS — R3915 Urgency of urination: Secondary | ICD-10-CM | POA: Diagnosis not present

## 2019-10-01 ENCOUNTER — Ambulatory Visit: Payer: Medicare Other | Admitting: Family

## 2019-10-04 ENCOUNTER — Other Ambulatory Visit: Payer: Self-pay

## 2019-10-04 ENCOUNTER — Encounter: Payer: Self-pay | Admitting: Family

## 2019-10-04 ENCOUNTER — Ambulatory Visit (INDEPENDENT_AMBULATORY_CARE_PROVIDER_SITE_OTHER): Payer: Medicare Other | Admitting: Family

## 2019-10-04 VITALS — BP 142/78 | HR 68 | Resp 16 | Ht 71.0 in | Wt 211.5 lb

## 2019-10-04 DIAGNOSIS — I359 Nonrheumatic aortic valve disorder, unspecified: Secondary | ICD-10-CM

## 2019-10-04 DIAGNOSIS — I2510 Atherosclerotic heart disease of native coronary artery without angina pectoris: Secondary | ICD-10-CM

## 2019-10-04 DIAGNOSIS — Z953 Presence of xenogenic heart valve: Secondary | ICD-10-CM

## 2019-10-04 DIAGNOSIS — E782 Mixed hyperlipidemia: Secondary | ICD-10-CM | POA: Diagnosis not present

## 2019-10-04 DIAGNOSIS — I10 Essential (primary) hypertension: Secondary | ICD-10-CM | POA: Diagnosis not present

## 2019-10-04 MED ORDER — TRIAMTERENE-HCTZ 37.5-25 MG PO TABS: 1.0000 | ORAL_TABLET | ORAL | 1 refills | Status: DC

## 2019-10-04 MED ORDER — METOPROLOL TARTRATE 25 MG PO TABS: 12.5000 mg | ORAL_TABLET | Freq: Two times a day (BID) | ORAL | 1 refills | Status: DC

## 2019-10-04 NOTE — Progress Notes (Addendum)
Office Visit    Patient Name: Guy Phillips Date of Encounter: 10/04/2019  Primary Care Provider:  Leone Haven, MD Primary Cardiologist:  Kathlyn Sacramento, MD Electrophysiologist:  None   Chief Complaint    Guy Phillips is a 79 y.o. male with a hx of hx of  hx of CAD s/p CABGx3 (LIMA to LAD, SVG to PDA,and bioprosthetic AVR (St. Jude Trifecta biologic valve) with Dr. Norm Parcel 4/96/75, complicated by L hemispheric cortical stroke, DM2, HTN, obesity, HLD presents for 6 month follow up of CAD and aortic valve disease.  Past Medical History    Past Medical History:  Diagnosis Date  . Acute cholecystitis 08/21/2017  . CAD (coronary artery disease)    s/p MI 2011 Cath showed 100% occlusion of RCA with collaterals open, followed by Dr. Ubaldo Glassing  . Cerebrovascular accident (CVA) due to embolism of left middle cerebral artery ([place]) 06/29/2014   Overview:  Post op event 04/2014  . Conjunctival cyst of left eye 06/26/2012  . Dental bridge present    also caps and implants  . Diabetes mellitus Dx 2005   HgbA1c 6.8%, [facility] 2001  . Hernia    Umbilical  . Hyperlipidemia   . Hypertension   . Kidney stone    2008  . Prostate cancer ([place])   . Squamous cell cancer of scalp and skin of neck    [facility]  . Stroke ([place]) 05/20/2014   small left hemispheric cortical stroke following CABG.  No deficits  . Uses hearing aid    has.  Doesn't usually wear   Past Surgical History:  Procedure Laterality Date  . CHOLECYSTECTOMY N/A 08/23/2017   Procedure: LAPAROSCOPIC CHOLECYSTECTOMY;  Surgeon: Vickie Epley, MD;  Location: [facility] ORS;  Service: General;  Laterality: N/A;  . COLONOSCOPY WITH PROPOFOL N/A 04/28/2017   Procedure: COLONOSCOPY WITH PROPOFOL;  Surgeon: Lucilla Lame, MD;  Location: [place];  Service: Gastroenterology;  Laterality: N/A;  Diabetic - oral meds  . CORONARY ARTERY BYPASS GRAFT  2015   x3 vessels, with AVR  . EYE  SURGERY     cataract extraction, bilateral  . FUNCTIONAL ENDOSCOPIC SINUS SURGERY     Dr. Fatima Sanger in [place]  . POLYPECTOMY  04/28/2017   Procedure: POLYPECTOMY;  Surgeon: Lucilla Lame, MD;  Location: [place];  Service: Gastroenterology;;    Allergies  Allergies  Allergen Reactions  . Grapefruit Extract Other (See Comments)    Mouth sores  . Other Other (See Comments)  . Pollen Extract Rash    History of Present Illness    Guy Phillips is a 79 y.o. male with a hx of CAD s/p CABGx3 (LIMA to LAD, SVG to PDA,and bioprosthetic AVR (St. Jude Trifecta biologic valve) with Dr. Norm Parcel 06/[birth year]/37, complicated by L hemispheric cortical stroke, DM2, HTN, obesity, HLD. Follow up echo wtih normal AV function, severe mitral annular calcification, normal LVEF. He had cholecystitis 07/2017 and underwent lapropscopic cholecystectomy. Last seen by Dr. Fletcher Anon 03/30/19.   Very pleasant gentleman who reports today for 23-monthfollow-up of his CAD and aortic valve disease s/p bioprosthetic aortic valve replacement.   Was recently diagnosed with prostate cancer, following with Dr. MTammi Klippel and has plans for radiation. Tells me he was started on "hormone therapy" and his blood sugars have been somewhat elevated. He has upcoming follow-up with his primary care provider.  Denies chest pain, pressure, tightness.  Reports no shortness of breath at rest, edema, orthopnea,  PND.  Does report some dyspnea on exertion which he attributes to being out of shape.  Tells me he has not been exercising recently.  He is looking into different exercise regimens as he and his wife are understandably not comfortable returning to the gym in the setting of the COVID-19 pandemic.  He is scheduled for his first Covid vaccine this week.  Does not check his blood pressure at home.  On initial check it was 142/78.  On recheck it was 130/80.  EKGs/Labs/Other Studies Reviewed:   The following studies were reviewed today: Echo  08/22/2017   - Left ventricle: Wall thickness was increased in a pattern of mild   LVH. Systolic function was normal. The estimated ejection   fraction was in the range of 50% to 55%. - Aortic valve: A bioprosthesis was present. - Mitral valve: Calcified annulus. Mildly thickened leaflets .   There was mild to moderate regurgitation.  EKG:  EKG is ordered today.  The ekg ordered today demonstrates sinus rhythm 68 bpm with first-degree AV block and nonspecific IVCD -nonspecific T wave changes-stable compared to previous EKG.  Recent Labs: 06/01/2019: ALT 15; BUN 18; Creatinine, Ser 0.94; Hemoglobin 14.6; Platelets 211.0; Potassium 3.8; Sodium 140; TSH 1.40  Recent Lipid Panel    Component Value Date/Time   CHOL 137 03/02/2019 0901   TRIG 162.0 (H) 03/02/2019 0901   HDL 35.40 (L) 03/02/2019 0901   CHOLHDL 4 03/02/2019 0901   VLDL 32.4 03/02/2019 0901   LDLCALC 70 03/02/2019 0901   LDLDIRECT 79.0 04/26/2016 0909    Home Medications   Current Meds  Medication Sig  . acetaminophen (TYLENOL) 325 MG tablet Take by mouth every 6 (six) hours as needed.   Marland Kitchen aspirin 81 MG tablet Take 81 mg by mouth daily.    . Blood Glucose Monitoring Suppl (ONE TOUCH ULTRA 2) w/Device KIT USE AS DIRECTED  . Blood Glucose Monitoring Suppl (ONE TOUCH ULTRA SYSTEM KIT) w/Device KIT 1 kit by Does not apply route once.  . Cholecalciferol (VITAMIN D3) 2000 UNITS TABS Take 1 tablet by mouth daily.    . Cyanocobalamin (B-12) 5000 MCG CAPS Take by mouth.  . fluticasone (FLONASE) 50 MCG/ACT nasal spray Place into the nose Daily.  Marland Kitchen glipiZIDE (GLUCOTROL) 5 MG tablet TAKE 1 TABLET BY MOUTH TWICE A DAY BEFORE MEALS  . glucose blood (ONE TOUCH ULTRA TEST) test strip USE AS INSTRUCTED  . glucose blood test strip CHECK BLOOD SUGAR ONCE A DAY  . metFORMIN (GLUCOPHAGE) 1000 MG tablet TAKE 1 TABLET TWICE DAILY  WITH MEALS  . metoprolol tartrate (LOPRESSOR) 25 MG tablet Take 0.5 tablets (12.5 mg total) by mouth 2 (two) times  daily. Take half tablet in the am and half in pm  . Multiple Vitamin (MULTIVITAMIN) tablet Take 1 tablet by mouth daily.    . nitroGLYCERIN (NITROSTAT) 0.4 MG SL tablet Place 1 tablet (0.4 mg total) under the tongue every 5 (five) minutes as needed.  . rosuvastatin (CRESTOR) 20 MG tablet TAKE 1 TABLET DAILY  . triamterene-hydrochlorothiazide (MAXZIDE-25) 37.5-25 MG tablet Take 1 tablet by mouth 1 day or 1 dose.  . [DISCONTINUED] metoprolol tartrate (LOPRESSOR) 25 MG tablet Take 12.5 mg by mouth daily. Take half tablet in the am and half in pm   . [DISCONTINUED] triamterene-hydrochlorothiazide (MAXZIDE-25) 37.5-25 MG tablet Take 1 tablet by mouth 1 day or 1 dose.      Review of Systems    Review of Systems  Constitution: Negative for  chills, fever and malaise/fatigue.  Cardiovascular: Positive for dyspnea on exertion. Negative for chest pain, leg swelling, near-syncope, orthopnea, palpitations and syncope.  Respiratory: Negative for cough, shortness of breath and wheezing.   Gastrointestinal: Negative for nausea and vomiting.  Neurological: Negative for dizziness, light-headedness and weakness.   All other systems reviewed and are otherwise negative except as noted above.  Physical Exam    VS:  BP (!) 142/78 (BP Location: Left Arm, Patient Position: Sitting, Cuff Size: Normal)   Pulse 68   Resp 16   Ht 5' 11" (1.803 m)   Wt 211 lb 8 oz (95.9 kg)   SpO2 98%   BMI 29.50 kg/m  , BMI Body mass index is 29.5 kg/m. GEN: Well nourished, overweight, well developed, in no acute distress. HEENT: normal. Neck: Supple, no JVD, carotid bruits, or masses. Cardiac: RRR, 1/6 murmur in aortic area, no  rubs, or gallops. No clubbing, cyanosis, edema.  Radials/PT 2+ and equal bilaterally.  Respiratory:  Respirations regular and unlabored, clear to auscultation bilaterally. GI: Soft, nontender, nondistended. MS: No deformity or atrophy. Skin: Warm and dry, no rash. Neuro:  Strength and sensation  are intact. Psych: Normal affect.  Accessory Clinical Findings    ECG personally reviewed by me today - SR with 1st degree AV block rate 60 bpm with nonspecific intraventricular block-stable T wave changes noted in lead I, 2, V5, V6- no acute changes.  Assessment & Plan    1. CAD s/p CABG -no anginal symptoms.  No indication for ischemic evaluation at this time.  Continue GDMT aspirin, beta-blocker, statin, as needed nitroglycerin.  2. Aortic valve disease s/p bioprosthetic AVR in 2015 - Utilizes SBE prophylaxis as prescribed by his dentist.  Update echocardiogram.   3. HTN -BP well controlled. Continue present antihypertensive regimen of triamterene-HCTZ 37.5-25, metoprolol tartrate 12.5 mg twice daily.  A refill was sent of both of these medications as he believes they were still being refilled by his previous cardiologist.  4. HLD, LDL goal less than 70 -02/2019 LDL 70.  Continue rosuvastatin 20 mg daily.  5. DM2 -follows with his PCP.  If additional agent needed consider SGLT2 I for cardioprotective benefit.  6. Prostate cancer -following with oncology.  Anticipates radiation therapy.  Disposition: Follow up in 6 month(s) with Dr. Fletcher Anon or APP  Loel Dubonnet, NP 10/04/2019, 11:04 AM

## 2019-10-04 NOTE — Addendum Note (Signed)
Addended by: Loel Dubonnet on: 10/04/2019 11:22 AM   Modules accepted: Orders

## 2019-10-04 NOTE — Patient Instructions (Addendum)
Medication Instructions:  No medication changes today.  *If you need a refill on your cardiac medications before your next appointment, please call your pharmacy*  Lab Work: No lab work today.  Testing/Procedures: You had an EKG today.  Your physician has requested that you have an echocardiogram. Echocardiography is a painless test that uses sound waves to create images of your heart. It provides your doctor with information about the size and shape of your heart and how well your heart's chambers and valves are working. This procedure takes approximately one hour. There are no restrictions for this procedure.  Follow-Up: At [facility], you and your health needs are our priority.  As part of our continuing mission to provide you with exceptional heart care, we have created designated Provider Care Teams.  These Care Teams include your primary Cardiologist (physician) and Advanced Practice Providers (APPs -  Physician Assistants and Nurse Practitioners) who all work together to provide you with the care you need, when you need it.  Your next appointment:   6 month(s)  The format for your next appointment:   In Person  Provider:    You may see Kathlyn Sacramento, MD or one of the following Advanced Practice Providers on your designated Care Team:    Murray Hodgkins, NP  Christell Faith, PA-C  Marrianne Mood, PA-C  Other Instructions

## 2019-10-07 ENCOUNTER — Ambulatory Visit: Payer: Medicare Other | Attending: Internal Medicine

## 2019-10-07 DIAGNOSIS — Z23 Encounter for immunization: Secondary | ICD-10-CM | POA: Insufficient documentation

## 2019-10-07 NOTE — Progress Notes (Signed)
   Covid-19 Vaccination Clinic  Name:  Guy Phillips    MRN: [id number] DOB: June 08, 1941  10/07/2019  [name] was observed post Covid-19 immunization for 15 minutes without incidence. He was provided with Vaccine Information Sheet and instruction to access the V-Safe system.   [name] was instructed to call 911 with any severe reactions post vaccine: Marland Kitchen Difficulty breathing  . Swelling of your face and throat  . A fast heartbeat  . A bad rash all over your body  . Dizziness and weakness    Immunizations Administered    Name Date Dose VIS Date Route   Pfizer COVID-19 Vaccine 10/07/2019  9:30 AM 0.3 mL 09/03/2019 Intramuscular   Manufacturer: [place]   Lot: [id number]   [place]: [id number]

## 2019-10-14 ENCOUNTER — Other Ambulatory Visit: Payer: Self-pay | Admitting: Family

## 2019-10-14 DIAGNOSIS — I359 Nonrheumatic aortic valve disorder, unspecified: Secondary | ICD-10-CM

## 2019-10-15 DIAGNOSIS — Z5111 Encounter for antineoplastic chemotherapy: Secondary | ICD-10-CM | POA: Diagnosis not present

## 2019-10-15 DIAGNOSIS — C61 Malignant neoplasm of prostate: Secondary | ICD-10-CM | POA: Diagnosis not present

## 2019-10-20 ENCOUNTER — Telehealth: Payer: Self-pay | Admitting: *Deleted

## 2019-10-20 NOTE — Telephone Encounter (Signed)
Called patient to inform of fid. markers and space oar placement on 11-23-19 @ [facility] and his sim will be on 11-26-19- arrival time- 9:45 am @ Dr. Johny Shears Office, spoke with patient and he is aware of these appts.

## 2019-10-21 ENCOUNTER — Other Ambulatory Visit: Payer: Self-pay | Admitting: Urology

## 2019-10-21 DIAGNOSIS — C61 Malignant neoplasm of prostate: Secondary | ICD-10-CM

## 2019-10-22 ENCOUNTER — Ambulatory Visit: Payer: Medicare Other | Admitting: Family Medicine

## 2019-10-25 ENCOUNTER — Ambulatory Visit: Payer: Medicare Other

## 2019-10-25 ENCOUNTER — Ambulatory Visit: Payer: Medicare Other | Attending: Internal Medicine

## 2019-10-25 DIAGNOSIS — Z23 Encounter for immunization: Secondary | ICD-10-CM | POA: Insufficient documentation

## 2019-10-25 NOTE — Progress Notes (Signed)
   Covid-19 Vaccination Clinic  Name:  HAMILTON HEBERLEIN    MRN: [id number] DOB: 1941/04/03  10/25/2019  Mr. Kanemoto was observed post Covid-19 immunization for 15 minutes without incidence. He was provided with Vaccine Information Sheet and instruction to access the V-Safe system.   Mr. Pennel was instructed to call 911 with any severe reactions post vaccine: Marland Kitchen Difficulty breathing  . Swelling of your face and throat  . A fast heartbeat  . A bad rash all over your body  . Dizziness and weakness    Immunizations Administered    Name Date Dose VIS Date Route   Pfizer COVID-19 Vaccine 10/25/2019  9:50 AM 0.3 mL 09/03/2019 Intramuscular   Manufacturer: [place]   Lot: [id number]   [place]: [id number]

## 2019-10-26 ENCOUNTER — Other Ambulatory Visit: Payer: Medicare Other

## 2019-10-27 ENCOUNTER — Other Ambulatory Visit: Payer: Self-pay | Admitting: Family Medicine

## 2019-11-03 ENCOUNTER — Other Ambulatory Visit: Payer: Self-pay | Admitting: Family Medicine

## 2019-11-04 ENCOUNTER — Other Ambulatory Visit: Payer: Self-pay

## 2019-11-04 ENCOUNTER — Ambulatory Visit (INDEPENDENT_AMBULATORY_CARE_PROVIDER_SITE_OTHER): Payer: Medicare Other

## 2019-11-04 DIAGNOSIS — I359 Nonrheumatic aortic valve disorder, unspecified: Secondary | ICD-10-CM | POA: Diagnosis not present

## 2019-11-11 ENCOUNTER — Telehealth: Payer: Self-pay | Admitting: *Deleted

## 2019-11-11 NOTE — Telephone Encounter (Signed)
Results called to pt. Pt verbalized understanding. Unable to enter order for echo 2 years out at this time.

## 2019-11-11 NOTE — Telephone Encounter (Signed)
-----   Message from Loel Dubonnet, NP sent at 11/10/2019  4:39 PM EST ----- Normal pumping function. Mild stiffness of heart and mild thickening of LV muscle, this is stable compared to previous and likely due to his hypertension. Dr. 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 Recommend repeat echocardiogram in 2 years. Overall good result!

## 2019-11-23 DIAGNOSIS — C61 Malignant neoplasm of prostate: Secondary | ICD-10-CM | POA: Diagnosis not present

## 2019-11-25 ENCOUNTER — Telehealth: Payer: Self-pay | Admitting: *Deleted

## 2019-11-25 NOTE — Telephone Encounter (Signed)
CALLED PATIENT TO REMIND OF SIM AND MRI FOR 11-26-19, SPOKE WITH PATIENT AND HE IS AWARE OF THESE APPTS.

## 2019-11-26 ENCOUNTER — Ambulatory Visit
Admission: RE | Admit: 2019-11-26 | Discharge: 2019-11-26 | Disposition: A | Discharge status: Home / Self Care | Payer: Medicare Other | Source: Ambulatory Visit | Attending: Radiation Oncology | Admitting: Radiation Oncology

## 2019-11-26 ENCOUNTER — Ambulatory Visit (HOSPITAL_COMMUNITY)
Admission: RE | Admit: 2019-11-26 | Discharge: 2019-11-26 | Disposition: A | Discharge status: Home / Self Care | Payer: Medicare Other | Source: Ambulatory Visit | Attending: Urology | Admitting: Urology

## 2019-11-26 ENCOUNTER — Other Ambulatory Visit: Payer: Self-pay

## 2019-11-26 ENCOUNTER — Encounter: Payer: Self-pay | Admitting: Medical Oncology

## 2019-11-26 DIAGNOSIS — Z515 Encounter for palliative care: Secondary | ICD-10-CM | POA: Insufficient documentation

## 2019-11-26 DIAGNOSIS — C61 Malignant neoplasm of prostate: Secondary | ICD-10-CM | POA: Insufficient documentation

## 2019-11-26 NOTE — Progress Notes (Signed)
  Radiation Oncology         [phone] ________________________________  Name: Guy Phillips MRN: [id number]  Date: 11/26/2019  DOB: [birth year]  SIMULATION AND TREATMENT PLANNING NOTE    ICD-10-CM   1. Malignant neoplasm of prostate ([place])  C61     DIAGNOSIS:  79 y.o. gentleman with Stage T1c adenocarcinoma of the prostate with Gleason score of 3+4, and PSA of 8.97  NARRATIVE:  The patient was brought to the [facility].  Identity was confirmed.  All relevant records and images related to the planned course of therapy were reviewed.  The patient freely provided informed written consent to proceed with treatment after reviewing the details related to the planned course of therapy. The consent form was witnessed and verified by the simulation staff.  Then, the patient was set-up in a stable reproducible supine position for radiation therapy.  A vacuum lock pillow device was custom fabricated to position his legs in a reproducible immobilized position.  Then, I performed a urethrogram under sterile conditions to identify the prostatic apex.  CT images were obtained.  Surface markings were placed.  The CT images were loaded into the planning software.  Then the prostate target and avoidance structures including the rectum, bladder, bowel and hips were contoured.  Treatment planning then occurred.  The radiation prescription was entered and confirmed.  A total of one complex treatment devices was fabricated. I have requested : Intensity Modulated Radiotherapy (IMRT) is medically necessary for this case for the following reason:  Rectal sparing.Marland Kitchen  PLAN:  The patient will receive 70 Gy in 28 fractions.  ________________________________  Sheral Apley Tammi Klippel, M.D.

## 2019-11-29 ENCOUNTER — Ambulatory Visit: Payer: Medicare Other | Admitting: Family Medicine

## 2019-11-29 DIAGNOSIS — C61 Malignant neoplasm of prostate: Secondary | ICD-10-CM | POA: Diagnosis not present

## 2019-11-29 DIAGNOSIS — Z515 Encounter for palliative care: Secondary | ICD-10-CM | POA: Diagnosis not present

## 2019-12-07 ENCOUNTER — Encounter: Payer: Self-pay | Admitting: Medical Oncology

## 2019-12-07 ENCOUNTER — Ambulatory Visit
Admission: RE | Admit: 2019-12-07 | Discharge: 2019-12-07 | Disposition: A | Discharge status: Home / Self Care | Payer: Medicare Other | Source: Ambulatory Visit | Attending: Radiation Oncology | Admitting: Radiation Oncology

## 2019-12-07 ENCOUNTER — Other Ambulatory Visit: Payer: Self-pay

## 2019-12-07 DIAGNOSIS — Z515 Encounter for palliative care: Secondary | ICD-10-CM | POA: Diagnosis not present

## 2019-12-07 DIAGNOSIS — C61 Malignant neoplasm of prostate: Secondary | ICD-10-CM | POA: Diagnosis not present

## 2019-12-08 ENCOUNTER — Other Ambulatory Visit: Payer: Self-pay

## 2019-12-08 ENCOUNTER — Other Ambulatory Visit: Payer: Self-pay | Admitting: Family Medicine

## 2019-12-08 ENCOUNTER — Ambulatory Visit
Admission: RE | Admit: 2019-12-08 | Discharge: 2019-12-08 | Disposition: A | Discharge status: Home / Self Care | Payer: Medicare Other | Source: Ambulatory Visit | Attending: Radiation Oncology | Admitting: Radiation Oncology

## 2019-12-08 DIAGNOSIS — C61 Malignant neoplasm of prostate: Secondary | ICD-10-CM | POA: Diagnosis not present

## 2019-12-08 DIAGNOSIS — Z515 Encounter for palliative care: Secondary | ICD-10-CM | POA: Diagnosis not present

## 2019-12-09 ENCOUNTER — Other Ambulatory Visit: Payer: Self-pay

## 2019-12-09 ENCOUNTER — Ambulatory Visit
Admission: RE | Admit: 2019-12-09 | Discharge: 2019-12-09 | Disposition: A | Discharge status: Home / Self Care | Payer: Medicare Other | Source: Ambulatory Visit | Attending: Radiation Oncology | Admitting: Radiation Oncology

## 2019-12-09 DIAGNOSIS — C61 Malignant neoplasm of prostate: Secondary | ICD-10-CM | POA: Diagnosis not present

## 2019-12-09 DIAGNOSIS — Z515 Encounter for palliative care: Secondary | ICD-10-CM | POA: Diagnosis not present

## 2019-12-10 ENCOUNTER — Other Ambulatory Visit: Payer: Self-pay

## 2019-12-10 ENCOUNTER — Ambulatory Visit
Admission: RE | Admit: 2019-12-10 | Discharge: 2019-12-10 | Disposition: A | Discharge status: Home / Self Care | Payer: Medicare Other | Source: Ambulatory Visit | Attending: Radiation Oncology | Admitting: Radiation Oncology

## 2019-12-10 DIAGNOSIS — C61 Malignant neoplasm of prostate: Secondary | ICD-10-CM | POA: Diagnosis not present

## 2019-12-10 DIAGNOSIS — Z515 Encounter for palliative care: Secondary | ICD-10-CM | POA: Diagnosis not present

## 2019-12-13 ENCOUNTER — Other Ambulatory Visit: Payer: Self-pay

## 2019-12-13 ENCOUNTER — Ambulatory Visit
Admission: RE | Admit: 2019-12-13 | Discharge: 2019-12-13 | Disposition: A | Discharge status: Home / Self Care | Payer: Medicare Other | Source: Ambulatory Visit | Attending: Radiation Oncology | Admitting: Radiation Oncology

## 2019-12-13 DIAGNOSIS — Z515 Encounter for palliative care: Secondary | ICD-10-CM | POA: Diagnosis not present

## 2019-12-13 DIAGNOSIS — C61 Malignant neoplasm of prostate: Secondary | ICD-10-CM | POA: Diagnosis not present

## 2019-12-14 ENCOUNTER — Ambulatory Visit
Admission: RE | Admit: 2019-12-14 | Discharge: 2019-12-14 | Disposition: A | Discharge status: Home / Self Care | Payer: Medicare Other | Source: Ambulatory Visit | Attending: Radiation Oncology | Admitting: Radiation Oncology

## 2019-12-14 ENCOUNTER — Other Ambulatory Visit: Payer: Self-pay

## 2019-12-14 DIAGNOSIS — C61 Malignant neoplasm of prostate: Secondary | ICD-10-CM | POA: Diagnosis not present

## 2019-12-14 DIAGNOSIS — Z515 Encounter for palliative care: Secondary | ICD-10-CM | POA: Diagnosis not present

## 2019-12-15 ENCOUNTER — Ambulatory Visit
Admission: RE | Admit: 2019-12-15 | Discharge: 2019-12-15 | Disposition: A | Discharge status: Home / Self Care | Payer: Medicare Other | Source: Ambulatory Visit | Attending: Radiation Oncology | Admitting: Radiation Oncology

## 2019-12-15 ENCOUNTER — Other Ambulatory Visit: Payer: Self-pay

## 2019-12-15 DIAGNOSIS — C61 Malignant neoplasm of prostate: Secondary | ICD-10-CM | POA: Diagnosis not present

## 2019-12-15 DIAGNOSIS — Z515 Encounter for palliative care: Secondary | ICD-10-CM | POA: Diagnosis not present

## 2019-12-16 ENCOUNTER — Ambulatory Visit
Admission: RE | Admit: 2019-12-16 | Discharge: 2019-12-16 | Disposition: A | Discharge status: Home / Self Care | Payer: Medicare Other | Source: Ambulatory Visit | Attending: Radiation Oncology | Admitting: Radiation Oncology

## 2019-12-16 ENCOUNTER — Other Ambulatory Visit: Payer: Self-pay

## 2019-12-16 DIAGNOSIS — C61 Malignant neoplasm of prostate: Secondary | ICD-10-CM | POA: Diagnosis not present

## 2019-12-16 DIAGNOSIS — Z515 Encounter for palliative care: Secondary | ICD-10-CM | POA: Diagnosis not present

## 2019-12-17 ENCOUNTER — Other Ambulatory Visit: Payer: Self-pay

## 2019-12-17 ENCOUNTER — Ambulatory Visit
Admission: RE | Admit: 2019-12-17 | Discharge: 2019-12-17 | Disposition: A | Discharge status: Home / Self Care | Payer: Medicare Other | Source: Ambulatory Visit | Attending: Radiation Oncology | Admitting: Radiation Oncology

## 2019-12-17 DIAGNOSIS — C61 Malignant neoplasm of prostate: Secondary | ICD-10-CM | POA: Diagnosis not present

## 2019-12-17 DIAGNOSIS — Z515 Encounter for palliative care: Secondary | ICD-10-CM | POA: Diagnosis not present

## 2019-12-20 ENCOUNTER — Other Ambulatory Visit: Payer: Self-pay

## 2019-12-20 ENCOUNTER — Ambulatory Visit
Admission: RE | Admit: 2019-12-20 | Discharge: 2019-12-20 | Disposition: A | Discharge status: Home / Self Care | Payer: Medicare Other | Source: Ambulatory Visit | Attending: Radiation Oncology | Admitting: Radiation Oncology

## 2019-12-20 DIAGNOSIS — C61 Malignant neoplasm of prostate: Secondary | ICD-10-CM | POA: Diagnosis not present

## 2019-12-20 DIAGNOSIS — Z515 Encounter for palliative care: Secondary | ICD-10-CM | POA: Diagnosis not present

## 2019-12-21 ENCOUNTER — Other Ambulatory Visit: Payer: Self-pay

## 2019-12-21 ENCOUNTER — Ambulatory Visit
Admission: RE | Admit: 2019-12-21 | Discharge: 2019-12-21 | Disposition: A | Discharge status: Home / Self Care | Payer: Medicare Other | Source: Ambulatory Visit | Attending: Radiation Oncology | Admitting: Radiation Oncology

## 2019-12-21 DIAGNOSIS — C61 Malignant neoplasm of prostate: Secondary | ICD-10-CM | POA: Diagnosis not present

## 2019-12-21 DIAGNOSIS — Z515 Encounter for palliative care: Secondary | ICD-10-CM | POA: Diagnosis not present

## 2019-12-22 ENCOUNTER — Ambulatory Visit
Admission: RE | Admit: 2019-12-22 | Discharge: 2019-12-22 | Disposition: A | Discharge status: Home / Self Care | Payer: Medicare Other | Source: Ambulatory Visit | Attending: Radiation Oncology | Admitting: Radiation Oncology

## 2019-12-22 ENCOUNTER — Other Ambulatory Visit: Payer: Self-pay

## 2019-12-22 DIAGNOSIS — Z515 Encounter for palliative care: Secondary | ICD-10-CM | POA: Diagnosis not present

## 2019-12-22 DIAGNOSIS — C61 Malignant neoplasm of prostate: Secondary | ICD-10-CM | POA: Diagnosis not present

## 2019-12-23 ENCOUNTER — Ambulatory Visit
Admission: RE | Admit: 2019-12-23 | Discharge: 2019-12-23 | Disposition: A | Discharge status: Home / Self Care | Payer: Medicare Other | Source: Ambulatory Visit | Attending: Radiation Oncology | Admitting: Radiation Oncology

## 2019-12-23 ENCOUNTER — Other Ambulatory Visit: Payer: Self-pay

## 2019-12-23 DIAGNOSIS — Z515 Encounter for palliative care: Secondary | ICD-10-CM | POA: Diagnosis not present

## 2019-12-23 DIAGNOSIS — C61 Malignant neoplasm of prostate: Secondary | ICD-10-CM | POA: Insufficient documentation

## 2019-12-24 ENCOUNTER — Other Ambulatory Visit: Payer: Self-pay

## 2019-12-24 ENCOUNTER — Ambulatory Visit
Admission: RE | Admit: 2019-12-24 | Discharge: 2019-12-24 | Disposition: A | Discharge status: Home / Self Care | Payer: Medicare Other | Source: Ambulatory Visit | Attending: Radiation Oncology | Admitting: Radiation Oncology

## 2019-12-24 DIAGNOSIS — C61 Malignant neoplasm of prostate: Secondary | ICD-10-CM | POA: Diagnosis not present

## 2019-12-24 DIAGNOSIS — Z515 Encounter for palliative care: Secondary | ICD-10-CM | POA: Diagnosis not present

## 2019-12-27 ENCOUNTER — Ambulatory Visit
Admission: RE | Admit: 2019-12-27 | Discharge: 2019-12-27 | Disposition: A | Discharge status: Home / Self Care | Payer: Medicare Other | Source: Ambulatory Visit | Attending: Radiation Oncology | Admitting: Radiation Oncology

## 2019-12-27 ENCOUNTER — Other Ambulatory Visit: Payer: Self-pay

## 2019-12-27 DIAGNOSIS — Z515 Encounter for palliative care: Secondary | ICD-10-CM | POA: Diagnosis not present

## 2019-12-27 DIAGNOSIS — C61 Malignant neoplasm of prostate: Secondary | ICD-10-CM | POA: Diagnosis not present

## 2019-12-28 ENCOUNTER — Other Ambulatory Visit: Payer: Self-pay

## 2019-12-28 ENCOUNTER — Ambulatory Visit
Admission: RE | Admit: 2019-12-28 | Discharge: 2019-12-28 | Disposition: A | Discharge status: Home / Self Care | Payer: Medicare Other | Source: Ambulatory Visit | Attending: Radiation Oncology | Admitting: Radiation Oncology

## 2019-12-28 DIAGNOSIS — C61 Malignant neoplasm of prostate: Secondary | ICD-10-CM | POA: Diagnosis not present

## 2019-12-28 DIAGNOSIS — Z515 Encounter for palliative care: Secondary | ICD-10-CM | POA: Diagnosis not present

## 2019-12-29 ENCOUNTER — Ambulatory Visit
Admission: RE | Admit: 2019-12-29 | Discharge: 2019-12-29 | Disposition: A | Discharge status: Home / Self Care | Payer: Medicare Other | Source: Ambulatory Visit | Attending: Radiation Oncology | Admitting: Radiation Oncology

## 2019-12-29 ENCOUNTER — Other Ambulatory Visit: Payer: Self-pay

## 2019-12-29 DIAGNOSIS — C61 Malignant neoplasm of prostate: Secondary | ICD-10-CM | POA: Diagnosis not present

## 2019-12-29 DIAGNOSIS — Z515 Encounter for palliative care: Secondary | ICD-10-CM | POA: Diagnosis not present

## 2019-12-30 ENCOUNTER — Ambulatory Visit
Admission: RE | Admit: 2019-12-30 | Discharge: 2019-12-30 | Disposition: A | Discharge status: Home / Self Care | Payer: Medicare Other | Source: Ambulatory Visit | Attending: Radiation Oncology | Admitting: Radiation Oncology

## 2019-12-30 ENCOUNTER — Other Ambulatory Visit: Payer: Self-pay

## 2019-12-30 DIAGNOSIS — Z515 Encounter for palliative care: Secondary | ICD-10-CM | POA: Diagnosis not present

## 2019-12-30 DIAGNOSIS — C61 Malignant neoplasm of prostate: Secondary | ICD-10-CM | POA: Diagnosis not present

## 2019-12-31 ENCOUNTER — Other Ambulatory Visit: Payer: Self-pay

## 2019-12-31 ENCOUNTER — Ambulatory Visit
Admission: RE | Admit: 2019-12-31 | Discharge: 2019-12-31 | Disposition: A | Discharge status: Home / Self Care | Payer: Medicare Other | Source: Ambulatory Visit | Attending: Radiation Oncology | Admitting: Radiation Oncology

## 2019-12-31 DIAGNOSIS — Z515 Encounter for palliative care: Secondary | ICD-10-CM | POA: Diagnosis not present

## 2019-12-31 DIAGNOSIS — C61 Malignant neoplasm of prostate: Secondary | ICD-10-CM | POA: Diagnosis not present

## 2020-01-03 ENCOUNTER — Ambulatory Visit
Admission: RE | Admit: 2020-01-03 | Discharge: 2020-01-03 | Disposition: A | Discharge status: Home / Self Care | Payer: Medicare Other | Source: Ambulatory Visit | Attending: Radiation Oncology | Admitting: Radiation Oncology

## 2020-01-03 ENCOUNTER — Other Ambulatory Visit: Payer: Self-pay

## 2020-01-03 DIAGNOSIS — Z515 Encounter for palliative care: Secondary | ICD-10-CM | POA: Diagnosis not present

## 2020-01-03 DIAGNOSIS — C61 Malignant neoplasm of prostate: Secondary | ICD-10-CM | POA: Diagnosis not present

## 2020-01-04 ENCOUNTER — Ambulatory Visit
Admission: RE | Admit: 2020-01-04 | Discharge: 2020-01-04 | Disposition: A | Discharge status: Home / Self Care | Payer: Medicare Other | Source: Ambulatory Visit | Attending: Radiation Oncology | Admitting: Radiation Oncology

## 2020-01-04 ENCOUNTER — Other Ambulatory Visit: Payer: Self-pay

## 2020-01-04 DIAGNOSIS — Z515 Encounter for palliative care: Secondary | ICD-10-CM | POA: Diagnosis not present

## 2020-01-04 DIAGNOSIS — C61 Malignant neoplasm of prostate: Secondary | ICD-10-CM | POA: Diagnosis not present

## 2020-01-05 ENCOUNTER — Other Ambulatory Visit: Payer: Self-pay

## 2020-01-05 ENCOUNTER — Ambulatory Visit
Admission: RE | Admit: 2020-01-05 | Discharge: 2020-01-05 | Disposition: A | Discharge status: Home / Self Care | Payer: Medicare Other | Source: Ambulatory Visit | Attending: Radiation Oncology | Admitting: Radiation Oncology

## 2020-01-05 DIAGNOSIS — Z515 Encounter for palliative care: Secondary | ICD-10-CM | POA: Diagnosis not present

## 2020-01-05 DIAGNOSIS — C61 Malignant neoplasm of prostate: Secondary | ICD-10-CM | POA: Diagnosis not present

## 2020-01-06 ENCOUNTER — Other Ambulatory Visit: Payer: Self-pay

## 2020-01-06 ENCOUNTER — Ambulatory Visit
Admission: RE | Admit: 2020-01-06 | Discharge: 2020-01-06 | Disposition: A | Discharge status: Home / Self Care | Payer: Medicare Other | Source: Ambulatory Visit | Attending: Radiation Oncology | Admitting: Radiation Oncology

## 2020-01-06 DIAGNOSIS — Z515 Encounter for palliative care: Secondary | ICD-10-CM | POA: Diagnosis not present

## 2020-01-06 DIAGNOSIS — C61 Malignant neoplasm of prostate: Secondary | ICD-10-CM | POA: Diagnosis not present

## 2020-01-07 ENCOUNTER — Ambulatory Visit
Admission: RE | Admit: 2020-01-07 | Discharge: 2020-01-07 | Disposition: A | Discharge status: Home / Self Care | Payer: Medicare Other | Source: Ambulatory Visit | Attending: Radiation Oncology | Admitting: Radiation Oncology

## 2020-01-07 ENCOUNTER — Other Ambulatory Visit: Payer: Self-pay

## 2020-01-07 DIAGNOSIS — C61 Malignant neoplasm of prostate: Secondary | ICD-10-CM | POA: Diagnosis not present

## 2020-01-07 DIAGNOSIS — Z515 Encounter for palliative care: Secondary | ICD-10-CM | POA: Diagnosis not present

## 2020-01-10 ENCOUNTER — Ambulatory Visit
Admission: RE | Admit: 2020-01-10 | Discharge: 2020-01-10 | Disposition: A | Discharge status: Home / Self Care | Payer: Medicare Other | Source: Ambulatory Visit | Attending: Radiation Oncology | Admitting: Radiation Oncology

## 2020-01-10 ENCOUNTER — Other Ambulatory Visit: Payer: Self-pay

## 2020-01-10 DIAGNOSIS — C61 Malignant neoplasm of prostate: Secondary | ICD-10-CM | POA: Diagnosis not present

## 2020-01-10 DIAGNOSIS — Z515 Encounter for palliative care: Secondary | ICD-10-CM | POA: Diagnosis not present

## 2020-01-11 ENCOUNTER — Other Ambulatory Visit: Payer: Self-pay

## 2020-01-11 ENCOUNTER — Ambulatory Visit
Admission: RE | Admit: 2020-01-11 | Discharge: 2020-01-11 | Disposition: A | Discharge status: Home / Self Care | Payer: Medicare Other | Source: Ambulatory Visit | Attending: Radiation Oncology | Admitting: Radiation Oncology

## 2020-01-11 DIAGNOSIS — Z85828 Personal history of other malignant neoplasm of skin: Secondary | ICD-10-CM | POA: Diagnosis not present

## 2020-01-11 DIAGNOSIS — C61 Malignant neoplasm of prostate: Secondary | ICD-10-CM | POA: Diagnosis not present

## 2020-01-11 DIAGNOSIS — Z515 Encounter for palliative care: Secondary | ICD-10-CM | POA: Diagnosis not present

## 2020-01-11 DIAGNOSIS — L821 Other seborrheic keratosis: Secondary | ICD-10-CM | POA: Diagnosis not present

## 2020-01-11 DIAGNOSIS — L570 Actinic keratosis: Secondary | ICD-10-CM | POA: Diagnosis not present

## 2020-01-11 DIAGNOSIS — L814 Other melanin hyperpigmentation: Secondary | ICD-10-CM | POA: Diagnosis not present

## 2020-01-12 ENCOUNTER — Ambulatory Visit
Admission: RE | Admit: 2020-01-12 | Discharge: 2020-01-12 | Disposition: A | Discharge status: Home / Self Care | Payer: Medicare Other | Source: Ambulatory Visit | Attending: Radiation Oncology | Admitting: Radiation Oncology

## 2020-01-12 ENCOUNTER — Other Ambulatory Visit: Payer: Self-pay

## 2020-01-12 DIAGNOSIS — C61 Malignant neoplasm of prostate: Secondary | ICD-10-CM | POA: Diagnosis not present

## 2020-01-12 DIAGNOSIS — Z515 Encounter for palliative care: Secondary | ICD-10-CM | POA: Diagnosis not present

## 2020-01-13 ENCOUNTER — Encounter: Payer: Self-pay | Admitting: Medical Oncology

## 2020-01-13 ENCOUNTER — Ambulatory Visit
Admission: RE | Admit: 2020-01-13 | Discharge: 2020-01-13 | Disposition: A | Discharge status: Home / Self Care | Payer: Medicare Other | Source: Ambulatory Visit | Attending: Radiation Oncology | Admitting: Radiation Oncology

## 2020-01-13 ENCOUNTER — Other Ambulatory Visit: Payer: Self-pay

## 2020-01-13 ENCOUNTER — Encounter: Payer: Self-pay | Admitting: Urology

## 2020-01-13 DIAGNOSIS — Z515 Encounter for palliative care: Secondary | ICD-10-CM | POA: Diagnosis not present

## 2020-01-13 DIAGNOSIS — C61 Malignant neoplasm of prostate: Secondary | ICD-10-CM | POA: Diagnosis not present

## 2020-01-25 DIAGNOSIS — C61 Malignant neoplasm of prostate: Secondary | ICD-10-CM | POA: Diagnosis not present

## 2020-01-25 DIAGNOSIS — R3915 Urgency of urination: Secondary | ICD-10-CM | POA: Diagnosis not present

## 2020-02-01 ENCOUNTER — Encounter: Payer: Self-pay | Admitting: Family Medicine

## 2020-02-01 ENCOUNTER — Other Ambulatory Visit: Payer: Self-pay

## 2020-02-01 ENCOUNTER — Ambulatory Visit (INDEPENDENT_AMBULATORY_CARE_PROVIDER_SITE_OTHER): Payer: Medicare Other | Admitting: Family Medicine

## 2020-02-01 DIAGNOSIS — C61 Malignant neoplasm of prostate: Secondary | ICD-10-CM

## 2020-02-01 DIAGNOSIS — I2510 Atherosclerotic heart disease of native coronary artery without angina pectoris: Secondary | ICD-10-CM

## 2020-02-01 DIAGNOSIS — I10 Essential (primary) hypertension: Secondary | ICD-10-CM | POA: Diagnosis not present

## 2020-02-01 DIAGNOSIS — E1169 Type 2 diabetes mellitus with other specified complication: Secondary | ICD-10-CM

## 2020-02-01 DIAGNOSIS — E669 Obesity, unspecified: Secondary | ICD-10-CM

## 2020-02-01 LAB — HEMOGLOBIN A1C: Hgb A1c MFr Bld: 6.8 % — ABNORMAL HIGH (ref 4.6–6.5)

## 2020-02-01 LAB — BASIC METABOLIC PANEL
BUN: 11 mg/dL (ref 6–23)
CO2: 30 mEq/L (ref 19–32)
Calcium: 9.3 mg/dL (ref 8.4–10.5)
Chloride: 101 mEq/L (ref 96–112)
Creatinine, Ser: 0.89 mg/dL (ref 0.40–1.50)
GFR: 82.77 mL/min (ref >60.00)
Glucose, Bld: 200 mg/dL — ABNORMAL HIGH (ref 70–99)
Potassium: 3.9 mEq/L (ref 3.5–5.1)
Sodium: 139 mEq/L (ref 135–145)

## 2020-02-01 NOTE — Assessment & Plan Note (Signed)
Check A1c.  Continue current medication.  Encouraged to eat 3 meals a day to avoid hypoglycemia.

## 2020-02-01 NOTE — Assessment & Plan Note (Signed)
Borderline low.  They will start checking his blood pressure at home daily for the next week and then send [place] his readings.  We will determine if we need to back off on any of his medicines at that time.

## 2020-02-01 NOTE — Progress Notes (Signed)
  Tommi Rumps, MD Phone: [phone]  KORDAI SKEMP is a 79 y.o. male who presents today for f/u.  DIABETES Disease Monitoring: Blood Sugar ranges-not checking Polyuria/phagia/dipsia- no      Optho- UTD Medications: Compliance- taking metformin, glipizide Hypoglycemic symptoms- rare when he misses a meal  HYPERTENSION  Disease Monitoring  Home BP Monitoring not checking Chest pain- no    Dyspnea- no Medications  Compliance-  Taking metoprolol, maxzide. Lightheadedness-  no  Edema- no  Prostate cancer: Patient has finished radiation.  He has followed up with urology and they plan to repeat a PSA in August.  He notes his urinary issues are getting better.  He lost about 16 pounds with this.     Social History   Tobacco Use  Smoking Status Former Smoker  . Packs/day: 3.00  . Years: 33.00  . Pack years: 99.00  . Types: Cigarettes  . Quit date: 07/03/1991  . Years since quitting: 28.6  Smokeless Tobacco Never Used     ROS see history of present illness  Objective  Physical Exam Vitals:   02/01/20 0813  BP: 90/60  Pulse: (!) 57  Temp: (!) 95.2 F (35.1 C)  SpO2: 98%    BP Readings from Last 3 Encounters:  02/01/20 90/60  10/04/19 (!) 142/78  07/21/19 120/80   Wt Readings from Last 3 Encounters:  02/01/20 212 lb 6.4 oz (96.3 kg)  10/04/19 211 lb 8 oz (95.9 kg)  09/21/19 205 lb (93 kg)    Physical Exam Constitutional:      General: He is not in acute distress.    Appearance: He is not diaphoretic.  Cardiovascular:     Rate and Rhythm: Normal rate and regular rhythm.     Heart sounds: Normal heart sounds.  Pulmonary:     Effort: Pulmonary effort is normal.     Breath sounds: Normal breath sounds.  Musculoskeletal:     Right lower leg: No edema.     Left lower leg: No edema.  Skin:    General: Skin is warm and dry.  Neurological:     Mental Status: He is alert.      Assessment/Plan: Please see individual problem list.  Diabetes  mellitus type 2 in obese (HCC) Check A1c.  Continue current medication.  Encouraged to eat 3 meals a day to avoid hypoglycemia.  Hypertension Borderline low.  They will start checking his blood pressure at home daily for the next week and then send [place] his readings.  We will determine if we need to back off on any of his medicines at that time.  Malignant neoplasm of prostate [facility]) Recently completed radiation therapy.  Will follow up with urology as planned.   Orders Placed This Encounter  Procedures  . Basic Metabolic Panel (BMET)  . HgB A1c    No orders of the defined types were placed in this encounter.   This visit occurred during the SARS-CoV-2 public health emergency.  Safety protocols were in place, including screening questions prior to the visit, additional usage of staff PPE, and extensive cleaning of exam room while observing appropriate contact time as indicated for disinfecting solutions.    Tommi Rumps, MD [place]

## 2020-02-01 NOTE — Patient Instructions (Signed)
Nice to see you. We will check lab work today. Please check your blood pressure daily for the next week and then send [place] your readings. Please resume checking your sugars daily.

## 2020-02-01 NOTE — Assessment & Plan Note (Signed)
Recently completed radiation therapy.  Will follow up with urology as planned.

## 2020-02-16 ENCOUNTER — Telehealth: Payer: Self-pay

## 2020-02-16 ENCOUNTER — Encounter: Payer: Self-pay | Admitting: Urology

## 2020-02-16 ENCOUNTER — Telehealth: Payer: Self-pay | Admitting: Family Medicine

## 2020-02-16 NOTE — Telephone Encounter (Signed)
Appointment reminder for 02/17/20 patient verbalized he understood this is a telephone encounter. Meaningful use completed

## 2020-02-16 NOTE — Telephone Encounter (Signed)
Patient dropped off bp readings. Readings are up front in Dr. Sonnenberg's color folder. 

## 2020-02-17 ENCOUNTER — Ambulatory Visit
Admission: RE | Admit: 2020-02-17 | Discharge: 2020-02-17 | Disposition: A | Discharge status: Home / Self Care | Payer: Medicare Other | Source: Ambulatory Visit | Attending: Urology | Admitting: Urology

## 2020-02-17 ENCOUNTER — Other Ambulatory Visit: Payer: Self-pay

## 2020-02-17 DIAGNOSIS — C61 Malignant neoplasm of prostate: Secondary | ICD-10-CM

## 2020-02-17 NOTE — Telephone Encounter (Signed)
Patients BPs are generally well controlled for his age. He should continue with his current regimen and check his BP 2-3x/week until he follows up. He should bring the readings in to that visit.

## 2020-02-17 NOTE — Telephone Encounter (Signed)
BP reading received and put in lab basket for provider to review.  Lashunta Frieden,cma

## 2020-02-17 NOTE — Progress Notes (Signed)
Follow up prostate patient reports no hematuria dysuria at this time. Reports urgency with frequency without leakage. Nocturia x3. Denies any issues with his bowels. At this time is unsure if he has a follow up appointment scheduled for his urologist at this time. No other concerns at this time.

## 2020-02-17 NOTE — Progress Notes (Signed)
Radiation Oncology         [phone] ________________________________  Name: ISSA KOSMICKI MRN: [id number]  Date: 02/17/2020  DOB: 1940-12-02  Post Treatment Note  CC: Leone Haven, MD  Alexis Frock, MD  Diagnosis:   79 y.o. gentleman with Stage T1c adenocarcinoma of the prostate with Gleason score of 3+4, and PSA of 8.97.  Interval Since Last Radiation:  5 weeks  12/07/19 - 01/13/20: The prostate was treated to 70 Gy in 28 fractions of 2.5 Gy each.  Narrative:  I spoke with the patient to conduct his routine scheduled 1 month follow up visit via telephone to spare the patient unnecessary potential exposure in the healthcare setting during the current COVID-19 pandemic.The patient was notified in advance and gave permission to proceed with this visit format.   He tolerated treatment relatively well with minor urinary bother and modest fatigue.  He did experience increased frequency, urgency and UUI with weaker FOS during treatment. He denied gross hematuria or dysuria but did develop loose stools towards the end of treatment.                              On review of systems, the patient states that he is doing fairly well in general.  He continues with increased frequency and urgency with occasional urge incontinence but feels that these are gradually improving.  He continues taking Flomax daily.  He specifically denies hematuria, dysuria, straining to void, incomplete bladder emptying or suprapubic discomfort.  He reports a healthy appetite and is maintaining his weight.  He continues with decreased energy but denies fever, chills or night sweats.  He denies abdominal pain, nausea, vomiting, diarrhea or constipation.  Overall, he is pleased with his progress to date.  ALLERGIES:  is allergic to grapefruit extract; other; and pollen extract.  Meds: Current Outpatient Medications  Medication Sig Dispense Refill  . acetaminophen (TYLENOL) 325 MG tablet Take by mouth every 6  (six) hours as needed.     Marland Kitchen aspirin 81 MG tablet Take 81 mg by mouth daily.      . Blood Glucose Monitoring Suppl (ONE TOUCH ULTRA 2) w/Device KIT USE AS DIRECTED    . Blood Glucose Monitoring Suppl (ONE TOUCH ULTRA SYSTEM KIT) w/Device KIT 1 kit by Does not apply route once. 1 each 0  . Cholecalciferol (VITAMIN D3) 2000 UNITS TABS Take 1 tablet by mouth daily.      . Cyanocobalamin (B-12) 5000 MCG CAPS Take by mouth.    . fexofenadine (ALLEGRA) 180 MG tablet Take 180 mg by mouth daily.    . fluticasone (FLONASE) 50 MCG/ACT nasal spray Place into the nose Daily.    Marland Kitchen glipiZIDE (GLUCOTROL) 5 MG tablet TAKE 1 TABLET BY MOUTH TWICE A DAY BEFORE MEALS 180 tablet 3  . glucose blood (ONE TOUCH ULTRA TEST) test strip USE AS INSTRUCTED 300 each 4  . glucose blood test strip CHECK BLOOD SUGAR ONCE A DAY    . metFORMIN (GLUCOPHAGE) 1000 MG tablet TAKE 1 TABLET TWICE DAILY  WITH MEALS 180 tablet 1  . metoprolol tartrate (LOPRESSOR) 25 MG tablet Take 0.5 tablets (12.5 mg total) by mouth 2 (two) times daily. Take half tablet in the am and half in pm 90 tablet 1  . Multiple Vitamin (MULTIVITAMIN) tablet Take 1 tablet by mouth daily.      . nitroGLYCERIN (NITROSTAT) 0.4 MG SL tablet Place 1 tablet (0.4 mg  total) under the tongue every 5 (five) minutes as needed. 30 tablet 1  . rosuvastatin (CRESTOR) 20 MG tablet TAKE 1 TABLET DAILY 90 tablet 2  . tamsulosin (FLOMAX) 0.4 MG CAPS capsule Take 0.4 mg by mouth daily.    Marland Kitchen triamterene-hydrochlorothiazide (MAXZIDE-25) 37.5-25 MG tablet Take 1 tablet by mouth 1 day or 1 dose. 90 tablet 1   No current facility-administered medications for this encounter.    Physical Findings:  vitals were not taken for this visit.   /Unable to assess due to telephone follow-up visit format.  Lab Findings: Lab Results  Component Value Date   WBC 9.1 06/01/2019   HGB 14.6 06/01/2019   HCT 43.8 06/01/2019   MCV 93.7 06/01/2019   PLT 211.0 06/01/2019     Radiographic  Findings: No results found.  Impression/Plan: 1. 79 y.o. gentleman with Stage T1c adenocarcinoma of the prostate with Gleason score of 3+4, and PSA of 8.97. He will continue to follow up with urology for ongoing PSA determinations and had an appointment scheduled with Dr. Tresa Moore on 01/25/2020.  His next scheduled follow-up visit is on 04/25/2020. He understands what to expect with regards to PSA monitoring going forward.  He is hopeful that he will not have to continue on ADT long-term given his intermediate risk disease but we discussed that that would be at the discretion of his urologist.  I will look forward to following his response to treatment via correspondence with urology, and would be happy to continue to participate in his care if clinically indicated. I talked to the patient about what to expect in the future, including his risk for erectile dysfunction and rectal bleeding. I encouraged him to call or return to the office if he has any questions regarding his previous radiation or possible radiation side effects. He was comfortable with this plan and will follow up as needed.  Today, a comprehensive survivorship care plan and treatment summary was reviewed with the patient today detailing his prostate cancer diagnosis, treatment course, potential late/long-term effects of treatment, appropriate follow-up care with recommendations for the future, and patient education resources.  A copy of this summary, along with a letter will be sent to the patient's primary care provider via fax after today's visit.  2. Cancer screening:  Due to Mr. Valeriano history and his age, he should receive screening for skin cancers, colon cancer, and lung cancer.  The information and recommendations are listed on the patient's comprehensive care plan/treatment summary and were reviewed in detail with the patient.     3. Health maintenance and wellness promotion: Mr. Chevez was encouraged to consume 5-7 servings  of fruits and vegetables per day. He was provided a copy of the "Nutrition Rainbow" handout, as well as the handout "Take Control of Your Health and [place]" from the [place].  He was also encouraged to engage in moderate to vigorous exercise for 30 minutes per day most days of the week. Information was provided regarding the [facility] fitness program, which is designed for cancer survivors to help them become more physically fit after cancer treatments. We discussed that a healthy BMI is 18.5-24.9 and that maintaining a healthy weight reduces risk of cancer recurrences.  He was instructed to limit his alcohol consumption and continue to abstain from tobacco use.  Lastly, he was encouraged to use sunscreen and wear protective clothing when in the sun.     4. Support services/counseling: It is not uncommon for this  period of the patient's cancer care trajectory to be one of many emotions and stressors.  Mr. Bartoli was encouraged to take advantage of our many support services programs, support groups, and/or counseling in coping with his new life as a cancer survivor after completing anti-cancer treatment.  He was offered support today through active listening and expressive supportive counseling.  He was given information regarding our available services and encouraged to contact me with any questions or for help enrolling in any of our support group/programs.       Nicholos Johns, PA-C

## 2020-02-17 NOTE — Telephone Encounter (Signed)
I called and informed the patient that the provider wants him to continue his current regimen on his BP and to check his BP 2-3 X per week and bring those readings in at his next visit.  He understood.  Jden Want,cma

## 2020-03-26 ENCOUNTER — Other Ambulatory Visit: Payer: Self-pay

## 2020-03-26 ENCOUNTER — Emergency Department: Payer: Medicare Other

## 2020-03-26 ENCOUNTER — Emergency Department
Admission: EM | Admit: 2020-03-26 | Discharge: 2020-03-26 | Disposition: A | Discharge status: Home / Self Care | Payer: Medicare Other | Attending: Emergency Medicine | Admitting: Emergency Medicine

## 2020-03-26 ENCOUNTER — Encounter: Payer: Self-pay | Admitting: Radiology

## 2020-03-26 DIAGNOSIS — Z951 Presence of aortocoronary bypass graft: Secondary | ICD-10-CM | POA: Diagnosis not present

## 2020-03-26 DIAGNOSIS — I2510 Atherosclerotic heart disease of native coronary artery without angina pectoris: Secondary | ICD-10-CM | POA: Insufficient documentation

## 2020-03-26 DIAGNOSIS — R109 Unspecified abdominal pain: Secondary | ICD-10-CM | POA: Insufficient documentation

## 2020-03-26 DIAGNOSIS — Z7984 Long term (current) use of oral hypoglycemic drugs: Secondary | ICD-10-CM | POA: Insufficient documentation

## 2020-03-26 DIAGNOSIS — Z87891 Personal history of nicotine dependence: Secondary | ICD-10-CM | POA: Diagnosis not present

## 2020-03-26 DIAGNOSIS — I10 Essential (primary) hypertension: Secondary | ICD-10-CM | POA: Diagnosis not present

## 2020-03-26 DIAGNOSIS — Z85828 Personal history of other malignant neoplasm of skin: Secondary | ICD-10-CM | POA: Diagnosis not present

## 2020-03-26 DIAGNOSIS — Z9049 Acquired absence of other specified parts of digestive tract: Secondary | ICD-10-CM | POA: Diagnosis not present

## 2020-03-26 DIAGNOSIS — Z8546 Personal history of malignant neoplasm of prostate: Secondary | ICD-10-CM | POA: Insufficient documentation

## 2020-03-26 DIAGNOSIS — Z7982 Long term (current) use of aspirin: Secondary | ICD-10-CM | POA: Diagnosis not present

## 2020-03-26 DIAGNOSIS — E1140 Type 2 diabetes mellitus with diabetic neuropathy, unspecified: Secondary | ICD-10-CM | POA: Diagnosis not present

## 2020-03-26 DIAGNOSIS — I700 Atherosclerosis of aorta: Secondary | ICD-10-CM | POA: Diagnosis not present

## 2020-03-26 DIAGNOSIS — Z79899 Other long term (current) drug therapy: Secondary | ICD-10-CM | POA: Insufficient documentation

## 2020-03-26 DIAGNOSIS — Z86010 Personal history of colonic polyps: Secondary | ICD-10-CM | POA: Insufficient documentation

## 2020-03-26 DIAGNOSIS — N4289 Other specified disorders of prostate: Secondary | ICD-10-CM | POA: Diagnosis not present

## 2020-03-26 DIAGNOSIS — K4020 Bilateral inguinal hernia, without obstruction or gangrene, not specified as recurrent: Secondary | ICD-10-CM | POA: Diagnosis not present

## 2020-03-26 LAB — COMPREHENSIVE METABOLIC PANEL
ALT: 19 U/L (ref 0–44)
AST: 20 U/L (ref 15–41)
Albumin: 4 g/dL (ref 3.5–5.0)
Alkaline Phosphatase: 55 U/L (ref 38–126)
Anion gap: 10 (ref 5–15)
BUN: 17 mg/dL (ref 8–23)
CO2: 27 mmol/L (ref 22–32)
Calcium: 9.4 mg/dL (ref 8.9–10.3)
Chloride: 101 mmol/L (ref 98–111)
Creatinine, Ser: 1.05 mg/dL (ref 0.61–1.24)
GFR calc Af Amer: >60 mL/min (ref >60)
GFR calc non Af Amer: >60 mL/min (ref >60)
Glucose, Bld: 205 mg/dL — ABNORMAL HIGH (ref 70–99)
Potassium: 3.9 mmol/L (ref 3.5–5.1)
Sodium: 138 mmol/L (ref 135–145)
Total Bilirubin: 1.1 mg/dL (ref 0.3–1.2)
Total Protein: 6.9 g/dL (ref 6.5–8.1)

## 2020-03-26 LAB — URINALYSIS, COMPLETE (UACMP) WITH MICROSCOPIC
Bacteria, UA: NONE SEEN
Bilirubin Urine: NEGATIVE
Glucose, UA: NEGATIVE mg/dL
Hgb urine dipstick: NEGATIVE
Ketones, ur: 5 mg/dL — AB
Nitrite: NEGATIVE
Protein, ur: NEGATIVE mg/dL
Specific Gravity, Urine: 1.026 (ref 1.005–1.030)
pH: 5 (ref 5.0–8.0)

## 2020-03-26 LAB — CBC
HCT: 35.1 % — ABNORMAL LOW (ref 39.0–52.0)
Hemoglobin: 12.5 g/dL — ABNORMAL LOW (ref 13.0–17.0)
MCH: 32.8 pg (ref 26.0–34.0)
MCHC: 35.6 g/dL (ref 30.0–36.0)
MCV: 92.1 fL (ref 80.0–100.0)
Platelets: 166 10*3/uL (ref 150–400)
RBC: 3.81 MIL/uL — ABNORMAL LOW (ref 4.22–5.81)
RDW: 12.2 % (ref 11.5–15.5)
WBC: 5.6 10*3/uL (ref 4.0–10.5)
nRBC: 0 % (ref 0.0–0.2)

## 2020-03-26 LAB — LIPASE, BLOOD: Lipase: 125 U/L — ABNORMAL HIGH (ref 11–51)

## 2020-03-26 MED ORDER — TRAMADOL HCL 50 MG PO TABS: 50.0000 mg | ORAL_TABLET | Freq: Three times a day (TID) | ORAL | 0 refills | Status: DC | PRN

## 2020-03-26 MED ORDER — IOHEXOL 300 MG/ML  SOLN
100.0000 mL | Freq: Once | INTRAMUSCULAR | Status: AC | PRN
  Administered 2020-03-26: 100 mL via INTRAVENOUS

## 2020-03-26 NOTE — ED Triage Notes (Signed)
Pt presents to ED via POV with c/o L flank/back pain that started 3-4 days ago. Pt states pain is intermittent at this time, denies pain at this time, is intermittently worse with movement. Pt states hx of kidney stones and feels like pain is the same.

## 2020-03-26 NOTE — ED Provider Notes (Signed)
[facility] Emergency Department Provider Note   ____________________________________________   First MD Initiated Contact with Patient 03/26/20 [phone]     (approximate)  I have reviewed the triage vital signs and the nursing notes.   HISTORY  Chief Complaint Flank Pain and Back Pain    HPI Guy Phillips is a 79 y.o. male here for evaluation for having several episodes of severe fairly sharp pain off and on his left lower back more on his left lower spine, but not in the middle of his back over the last 5 days  Seems to be intermittent, for started like an achiness in his back.  Occasionally sometimes the pain will come and go lasting for portions of the day, and then goes away.  Today the pain came and was very severe and reports it feels just like when he had a previous kidney stone.  Some nausea but no vomiting.  Pain was severe 10 out of 10 in intensity very sharp located in his left lower back.  No numbness tingling or weakness in his legs.  He reports is not really in his back but is much more in his left side.  Does have a history of prostate cancer treated with radiation  No falls or injuries.  No fevers or chills.  No black or bloody stool.  No chest pain or trouble breathing.   He had a severe episode this morning that brought him to his knees.  But reports all of his pain symptoms are now completely gone.  He feels much better  Past Medical History:  Diagnosis Date  . Acute cholecystitis 08/21/2017  . CAD (coronary artery disease)    s/p MI 2011 Cath showed 100% occlusion of RCA with collaterals open, followed by Dr. Ubaldo Glassing  . Cerebrovascular accident (CVA) due to embolism of left middle cerebral artery ([place]) 06/29/2014   Overview:  Post op event 04/2014  . Conjunctival cyst of left eye 06/26/2012  . Dental bridge present    also caps and implants  . Diabetes mellitus Dx 2005   HgbA1c 6.8%, Optho [facility] 2001  . Hernia     Umbilical  . Hyperlipidemia   . Hypertension   . Kidney stone    2008  . Prostate cancer ([place])   . Squamous cell cancer of scalp and skin of neck    [facility]  . Stroke ([place]) 05/20/2014   small left hemispheric cortical stroke following CABG.  No deficits  . Uses hearing aid    has.  Doesn't usually wear    Patient Active Problem List   Diagnosis Date Noted  . Malignant neoplasm of prostate ([place]) 09/21/2019  . Night sweats 06/03/2019  . Weight loss 06/01/2019  . OAB (overactive bladder) 01/30/2019  . Elevated PSA 03/18/2018  . Hyperbilirubinemia   . Personal history of colonic polyps   . Benign neoplasm of ascending colon   . Polyp of sigmoid colon   . Rectal polyp   . Low back pain 03/19/2017  . Neuropathy, diabetic ([place]) 10/23/2015  . History of CVA (cerebrovascular accident) 06/29/2014  . H/O aortic valve replacement 06/29/2014  . Mitral valve annular calcification 06/29/2014  . H/O coronary artery bypass surgery 05/19/2014  . Obesity (BMI 30-39.9) 05/21/2013  . Urge incontinence 01/13/2012  . CAD (coronary artery disease) 01/13/2012  . Diabetes mellitus type 2 in obese ([place]) 07/03/2011  . Hypertension 07/03/2011  . Hyperlipidemia 07/03/2011    Past Surgical History:  Procedure Laterality Date  . CHOLECYSTECTOMY N/A 08/23/2017   Procedure: LAPAROSCOPIC CHOLECYSTECTOMY;  Surgeon: Vickie Epley, MD;  Location: [facility] ORS;  Service: General;  Laterality: N/A;  . COLONOSCOPY WITH PROPOFOL N/A 04/28/2017   Procedure: COLONOSCOPY WITH PROPOFOL;  Surgeon: Lucilla Lame, MD;  Location: [place];  Service: Gastroenterology;  Laterality: N/A;  Diabetic - oral meds  . CORONARY ARTERY BYPASS GRAFT  2015   x3 vessels, with AVR  . EYE SURGERY     cataract extraction, bilateral  . FUNCTIONAL ENDOSCOPIC SINUS SURGERY     Dr. Fatima Sanger in [place]  . POLYPECTOMY  04/28/2017   Procedure: POLYPECTOMY;  Surgeon: Lucilla Lame, MD;  Location: [place];   Service: Gastroenterology;;    Prior to Admission medications   Medication Sig Start Date End Date Taking? Authorizing Provider  acetaminophen (TYLENOL) 325 MG tablet Take by mouth every 6 (six) hours as needed.     [provider]  aspirin 81 MG tablet Take 81 mg by mouth daily.      [provider]  Blood Glucose Monitoring Suppl (ONE TOUCH ULTRA 2) w/Device KIT USE AS DIRECTED 10/31/15   [provider]  Blood Glucose Monitoring Suppl (ONE TOUCH ULTRA SYSTEM KIT) w/Device KIT 1 kit by Does not apply route once. 10/30/15   Jackolyn Confer, MD  Cholecalciferol (VITAMIN D3) 2000 UNITS TABS Take 1 tablet by mouth daily.      [provider]  Cyanocobalamin (B-12) 5000 MCG CAPS Take by mouth.    [provider]  fexofenadine (ALLEGRA) 180 MG tablet Take 180 mg by mouth daily.    [provider]  fluticasone (FLONASE) 50 MCG/ACT nasal spray Place into the nose Daily. 11/20/11   [provider]  glipiZIDE (GLUCOTROL) 5 MG tablet TAKE 1 TABLET BY MOUTH TWICE A DAY BEFORE MEALS 11/03/19   Leone Haven, MD  glucose blood (ONE TOUCH ULTRA TEST) test strip USE AS INSTRUCTED 04/02/18   Leone Haven, MD  glucose blood test strip CHECK BLOOD SUGAR ONCE A DAY 11/01/15   [provider]  metFORMIN (GLUCOPHAGE) 1000 MG tablet TAKE 1 TABLET TWICE DAILY  WITH MEALS 12/08/19   Leone Haven, MD  metoprolol tartrate (LOPRESSOR) 25 MG tablet Take 0.5 tablets (12.5 mg total) by mouth 2 (two) times daily. Take half tablet in the am and half in pm 10/04/19   Loel Dubonnet, NP  Multiple Vitamin (MULTIVITAMIN) tablet Take 1 tablet by mouth daily.      [provider]  nitroGLYCERIN (NITROSTAT) 0.4 MG SL tablet Place 1 tablet (0.4 mg total) under the tongue every 5 (five) minutes as needed. 03/18/18   Leone Haven, MD  rosuvastatin (CRESTOR) 20 MG tablet TAKE 1 TABLET DAILY 10/28/19   Leone Haven, MD  tamsulosin  (FLOMAX) 0.4 MG CAPS capsule Take 0.4 mg by mouth daily. 07/19/19   [provider]  traMADol (ULTRAM) 50 MG tablet Take 1 tablet (50 mg total) by mouth every 8 (eight) hours as needed for moderate pain or severe pain. 03/26/20   Delman Kitten, MD  triamterene-hydrochlorothiazide (MAXZIDE-25) 37.5-25 MG tablet Take 1 tablet by mouth 1 day or 1 dose. 10/04/19   Loel Dubonnet, NP    Allergies Grapefruit extract, Other, and Pollen extract  Family History  Problem Relation Age of Onset  . Cancer Father        liver  . Colon cancer Neg Hx   . Prostate  cancer Neg Hx   . Pancreatic cancer Neg Hx   . Breast cancer Neg Hx     Social History Social History   Tobacco Use  . Smoking status: Former Smoker    Packs/day: 3.00    Years: 33.00    Pack years: 99.00    Types: Cigarettes    Quit date: 07/03/1991    Years since quitting: 28.7  . Smokeless tobacco: Never Used  Vaping Use  . Vaping Use: Never used  Substance Use Topics  . Alcohol use: Not Currently    Comment: 1 glass of wine occ  . Drug use: No    Review of Systems Constitutional: No fever/chills Eyes: No visual changes. ENT: No sore throat. Cardiovascular: Denies chest pain. Respiratory: Denies shortness of breath. Gastrointestinal: Pain will radiate towards his left flank from the back Genitourinary: Negative for dysuria. Musculoskeletal: No mid back pain see HPI Skin: Negative for rash. Neurological: Negative for headaches, areas of focal weakness or numbness.    ____________________________________________   PHYSICAL EXAM:  VITAL SIGNS: ED Triage Vitals [03/26/20 0910]  Enc Vitals Group     BP (!) 150/84     Pulse Rate 80     Resp 20     Temp 98.4 F (36.9 C)     Temp Source Oral     SpO2 97 %     Weight 205 lb (93 kg)     Height 5\' 8"  (1.727 m)     Head Circumference      Peak Flow      Pain Score 0     Pain Loc      Pain Edu?      Excl. in GC?     Constitutional: Alert and  oriented. Well appearing and in no acute distress. Eyes: Conjunctivae are normal. Head: Atraumatic. Nose: No congestion/rhinnorhea. Mouth/Throat: Mucous membranes are moist. Neck: No stridor.  Cardiovascular: Normal rate, regular rhythm. Grossly normal heart sounds.  Good peripheral circulation. Respiratory: Normal respiratory effort.  No retractions. Lungs CTAB. Gastrointestinal: Soft and nontender. No distention. Musculoskeletal: No lower extremity tenderness nor edema. Neurologic:  Normal speech and language. No gross focal neurologic deficits are appreciated.  Skin:  Skin is warm, dry and intact. No rash noted. Psychiatric: Mood and affect are normal. Speech and behavior are normal.  ____________________________________________   LABS (all labs ordered are listed, but only abnormal results are displayed)  Labs Reviewed  URINALYSIS, COMPLETE (UACMP) WITH MICROSCOPIC - Abnormal; Notable for the following components:      Result Value   Color, Urine YELLOW (*)    APPearance HAZY (*)    Ketones, ur 5 (*)    Leukocytes,Ua SMALL (*)    All other components within normal limits  CBC - Abnormal; Notable for the following components:   RBC 3.81 (*)    Hemoglobin 12.5 (*)    HCT 35.1 (*)    All other components within normal limits  COMPREHENSIVE METABOLIC PANEL - Abnormal; Notable for the following components:   Glucose, Bld 205 (*)    All other components within normal limits  LIPASE, BLOOD - Abnormal; Notable for the following components:   Lipase 125 (*)    All other components within normal limits  URINE CULTURE   ____________________________________________  EKG  Reviewed and interpreted by me at 9:20 AM Heart rate 70 QRS 140 QTc 450 Suspect first-degree AV block, left bundle branch block.  Compared with previous similar T wave appearance compared with January 2021  ____________________________________________  RADIOLOGY  CT ABDOMEN PELVIS W CONTRAST  Result  Date: 03/26/2020 CLINICAL DATA:  Left flank pain starting 3 days ago. EXAM: CT ABDOMEN AND PELVIS WITH CONTRAST TECHNIQUE: Multidetector CT imaging of the abdomen and pelvis was performed using the standard protocol following bolus administration of intravenous contrast. CONTRAST:  167m OMNIPAQUE IOHEXOL 300 MG/ML  SOLN COMPARISON:  August 21, 2017 FINDINGS: Lower chest: No acute abnormality. Hepatobiliary: No focal liver abnormality is seen. Status post cholecystectomy. No biliary dilatation. Pancreas: Unremarkable. No pancreatic ductal dilatation or surrounding inflammatory changes. Spleen: Normal in size without focal abnormality. Adrenals/Urinary Tract: Adrenal glands are unremarkable. Kidneys are normal, without renal calculi, focal lesion, or hydronephrosis. Bladder is unremarkable. Stomach/Bowel: Stomach is within normal limits. Appendix appears normal. No evidence of bowel wall thickening, distention, or inflammatory changes. Moderate bowel content is identified throughout colon Vascular/Lymphatic: Aortic atherosclerosis. No enlarged abdominal or pelvic lymph nodes. Reproductive: Prostate calcifications are identified. Other: Bilateral inguinal herniation of mesenteric fat are noted. Musculoskeletal: Degenerative joint changes of the spine are noted. IMPRESSION: 1. No acute abnormality identified in the abdomen and pelvis. 2. Moderate bowel content is identified throughout colon. This can be seen in constipation. 3. Aortic atherosclerosis. Aortic Atherosclerosis (ICD10-I70.0). Electronically Signed   By: WAbelardo DieselM.D.   On: 03/26/2020 10:42    CT abdomen pelvis reviewed, no acute abnormalities.  Possible constipation ____________________________________________   PROCEDURES  Procedure(s) performed: None  Procedures  Critical Care performed: No  ____________________________________________   INITIAL IMPRESSION / ASSESSMENT AND PLAN / ED COURSE  Pertinent labs & imaging results that  were available during my care of the patient were reviewed by me and considered in my medical decision making (see chart for details).   Differential diagnosis includes but is not limited to, abdominal perforation, aortic dissection, cholecystitis, appendicitis, diverticulitis, colitis, esophagitis/gastritis, kidney stone, pyelonephritis, urinary tract infection, aortic aneurysm. All are considered in decision and treatment plan. Based upon the patient's presentation and risk factors, patient's report feels very similar to previous kidney stone but also given his age risk factors and symptoms we will proceed with CT with contrast to evaluate.  Low pretest probability for dissection, aneurysm perforation.  The fact that he is completely pain and symptom-free at the time my evaluation is reassuring.  Does not appear to have a severe or obvious musculoskeletal component.  Denies neurologic symptoms.  After evaluation, review of his lab work etc., his testing quite reassuring.  No acute cardiac or pulmonary symptoms.  Discussed with the patient, will prescribe him a small amount of tramadol having discussed risks and benefits this medication which she could use if pain recurs, but we discussed careful return precautions as well.  I am not quite certain as to etiology, but very reassuring evaluation at this time.  Discussed this with the patient and his wife were both understanding agreeable with plan and follow-up with primary  Return precautions and treatment recommendations and follow-up discussed with the patient who is agreeable with the plan.  Wife driving       ____________________________________________   FINAL CLINICAL IMPRESSION(S) / ED DIAGNOSES  Final diagnoses:  Left flank pain        Note:  This document was prepared using Dragon voice recognition software and may include unintentional dictation errors       QDelman Kitten MD 03/26/20 [phone]

## 2020-03-26 NOTE — ED Notes (Signed)
Pt states having left back pain that is worse on movement. Patient states his pain is a 0 out of 10 at this time, but that this morning it was worse. Patient denies fevers, diff urinating, blood in urine. Pt gave urine sample to this RN.

## 2020-03-26 NOTE — ED Notes (Signed)
Pt transported to CT ?

## 2020-03-27 LAB — URINE CULTURE: Culture: <10000 — AB

## 2020-04-04 ENCOUNTER — Ambulatory Visit (INDEPENDENT_AMBULATORY_CARE_PROVIDER_SITE_OTHER): Payer: Medicare Other | Admitting: Cardiovascular Disease

## 2020-04-04 ENCOUNTER — Encounter: Payer: Self-pay | Admitting: Cardiovascular Disease

## 2020-04-04 ENCOUNTER — Other Ambulatory Visit: Payer: Self-pay

## 2020-04-04 VITALS — BP 110/60 | HR 73 | Ht 68.0 in | Wt 207.1 lb

## 2020-04-04 DIAGNOSIS — E785 Hyperlipidemia, unspecified: Secondary | ICD-10-CM | POA: Diagnosis not present

## 2020-04-04 DIAGNOSIS — I2510 Atherosclerotic heart disease of native coronary artery without angina pectoris: Secondary | ICD-10-CM

## 2020-04-04 DIAGNOSIS — I10 Essential (primary) hypertension: Secondary | ICD-10-CM | POA: Diagnosis not present

## 2020-04-04 DIAGNOSIS — Z952 Presence of prosthetic heart valve: Secondary | ICD-10-CM | POA: Diagnosis not present

## 2020-04-04 NOTE — Progress Notes (Signed)
Cardiology Office Note   Date:  04/04/2020   ID:  SAN LOHMEYER, DOB 04-27-1941, MRN [id number]  PCP:  Leone Haven, MD  Cardiologist:   Kathlyn Sacramento, MD   Chief Complaint  Patient presents with  . office visit    6 month F/U; Meds verbally reviewed with patient.      History of Present Illness: Guy Phillips is a 79 y.o. male who is here today for a follow-up visit regarding coronary artery disease and aortic valve replacement. He is status post CABG and AVR with Dr. Norm Parcel on 05/19/2014 at [facility]. This was complicated by left hemispheric cortical stroke . He had a bioprosthetic AVR placed. A follow-up echo revealed normal AV valve function, severe mitral annular calcifications and normal ejection fraction.   He had cholecystitis in November 2018 and underwent laparoscopic cholecystectomy.  Other medical problems include diabetes mellitus, hypertension, obesity and hyperlipidemia.  He was diagnosed earlier this year with prostate cancer which was treated with radiation therapy followed by hormone therapy. He had an echocardiogram done in February which showed normal LV systolic function, mildly dilated left atrium, bioprosthetic aortic valve with mild to moderate stenosis with peak gradient of 22 mmHg.  There was also significant mitral annular calcification with mild stenosis.  He had recent left flank pain and thought it was due to kidney stones similar to what he had before.  CT scan was negative.  He thinks it was a pulled muscle and his symptoms resolved.  He has been doing well otherwise and denies any chest pain or shortness of breath.  Past Medical History:  Diagnosis Date  . Acute cholecystitis 08/21/2017  . CAD (coronary artery disease)    s/p MI 2011 Cath showed 100% occlusion of RCA with collaterals open, followed by Dr. Ubaldo Glassing  . Cerebrovascular accident (CVA) due to embolism of left middle cerebral artery ([place]) 06/29/2014   Overview:  Post op event  04/2014  . Conjunctival cyst of left eye 06/26/2012  . Dental bridge present    also caps and implants  . Diabetes mellitus Dx 2005   HgbA1c 6.8%, Optho [facility] 2001  . Hernia    Umbilical  . Hyperlipidemia   . Hypertension   . Kidney stone    2008  . Prostate cancer ([place])   . Squamous cell cancer of scalp and skin of neck    [facility]  . Stroke ([place]) 05/20/2014   small left hemispheric cortical stroke following CABG.  No deficits  . Uses hearing aid    has.  Doesn't usually wear    Past Surgical History:  Procedure Laterality Date  . CHOLECYSTECTOMY N/A 08/23/2017   Procedure: LAPAROSCOPIC CHOLECYSTECTOMY;  Surgeon: Vickie Epley, MD;  Location: [facility] ORS;  Service: General;  Laterality: N/A;  . COLONOSCOPY WITH PROPOFOL N/A 04/28/2017   Procedure: COLONOSCOPY WITH PROPOFOL;  Surgeon: Lucilla Lame, MD;  Location: [place];  Service: Gastroenterology;  Laterality: N/A;  Diabetic - oral meds  . CORONARY ARTERY BYPASS GRAFT  2015   x3 vessels, with AVR  . EYE SURGERY     cataract extraction, bilateral  . FUNCTIONAL ENDOSCOPIC SINUS SURGERY     Dr. Fatima Sanger in [place]  . POLYPECTOMY  04/28/2017   Procedure: POLYPECTOMY;  Surgeon: Lucilla Lame, MD;  Location: [place];  Service: Gastroenterology;;     Current Outpatient Medications  Medication Sig Dispense Refill  . acetaminophen (TYLENOL) 325 MG tablet Take by mouth  every 6 (six) hours as needed.     Marland Kitchen aspirin 81 MG tablet Take 81 mg by mouth daily.      . Blood Glucose Monitoring Suppl (ONE TOUCH ULTRA 2) w/Device KIT USE AS DIRECTED    . Cholecalciferol (VITAMIN D3) 2000 UNITS TABS Take 1 tablet by mouth daily.      . fluticasone (FLONASE) 50 MCG/ACT nasal spray Place into the nose daily as needed.     Marland Kitchen glipiZIDE (GLUCOTROL) 5 MG tablet TAKE 1 TABLET BY MOUTH TWICE A DAY BEFORE MEALS 180 tablet 3  . glucose blood test strip CHECK BLOOD SUGAR ONCE A DAY    . metFORMIN (GLUCOPHAGE) 1000  MG tablet TAKE 1 TABLET TWICE DAILY  WITH MEALS 180 tablet 1  . metoprolol tartrate (LOPRESSOR) 25 MG tablet Take 0.5 tablets (12.5 mg total) by mouth 2 (two) times daily. Take half tablet in the am and half in pm 90 tablet 1  . Multiple Vitamin (MULTIVITAMIN) tablet Take 1 tablet by mouth daily.      . nitroGLYCERIN (NITROSTAT) 0.4 MG SL tablet Place 1 tablet (0.4 mg total) under the tongue every 5 (five) minutes as needed. 30 tablet 1  . rosuvastatin (CRESTOR) 20 MG tablet TAKE 1 TABLET DAILY 90 tablet 2  . traMADol (ULTRAM) 50 MG tablet Take 1 tablet (50 mg total) by mouth every 8 (eight) hours as needed for moderate pain or severe pain. 12 tablet 0  . Blood Glucose Monitoring Suppl (ONE TOUCH ULTRA SYSTEM KIT) w/Device KIT 1 kit by Does not apply route once. 1 each 0  . Cyanocobalamin (B-12) 5000 MCG CAPS Take by mouth.    . fexofenadine (ALLEGRA) 180 MG tablet Take 180 mg by mouth daily.    Marland Kitchen glucose blood (ONE TOUCH ULTRA TEST) test strip USE AS INSTRUCTED 300 each 4  . tamsulosin (FLOMAX) 0.4 MG CAPS capsule Take 0.4 mg by mouth daily.    Marland Kitchen triamterene-hydrochlorothiazide (MAXZIDE-25) 37.5-25 MG tablet Take 1 tablet by mouth 1 day or 1 dose. 90 tablet 1   No current facility-administered medications for this visit.    Allergies:   Grapefruit extract, Other, and Pollen extract    Social History:  The patient  reports that he quit smoking about 28 years ago. His smoking use included cigarettes. He has a 99.00 pack-year smoking history. He has never used smokeless tobacco. He reports previous alcohol use. He reports that he does not use drugs.   Family History:  The patient's family history includes Cancer in his father.    ROS:  Please see the history of present illness.   Otherwise, review of systems are positive for none.   All other systems are reviewed and negative.    PHYSICAL EXAM: VS:  BP 110/60 (BP Location: Left Arm, Patient Position: Sitting, Cuff Size: Normal)   Pulse 73    Ht _0  (1.727 m)   Wt 207 lb 2 oz (94 kg)   SpO2 97%   BMI 31.49 kg/m  , BMI Body mass index is 31.49 kg/m. GEN: Well nourished, well developed, in no acute distress  HEENT: normal  Neck: no JVD, carotid bruits, or masses Cardiac: RRR; no  rubs, or gallops,no edema .  1/6 systolic murmur in the aortic area. Respiratory:  clear to auscultation bilaterally, normal work of breathing GI: soft, nontender, nondistended, + BS MS: no deformity or atrophy  Skin: warm and dry, no rash Neuro:  Strength and sensation are intact Psych: euthymic  mood, full affect   EKG:  EKG is ordered today. The ekg ordered today demonstrates normal sinus rhythm with first-degree AV block and nonspecific IVCD.   Recent Labs: 06/01/2019: TSH 1.40 03/26/2020: ALT 19; BUN 17; Creatinine, Ser 1.05; Hemoglobin 12.5; Platelets 166; Potassium 3.9; Sodium 138    Lipid Panel    Component Value Date/Time   CHOL 137 03/02/2019 0901   TRIG 162.0 (H) 03/02/2019 0901   HDL 35.40 (L) 03/02/2019 0901   CHOLHDL 4 03/02/2019 0901   VLDL 32.4 03/02/2019 0901   LDLCALC 70 03/02/2019 0901   LDLDIRECT 79.0 04/26/2016 0909      Wt Readings from Last 3 Encounters:  04/04/20 207 lb 2 oz (94 kg)  03/26/20 205 lb (93 kg)  02/01/20 212 lb 6.4 oz (96.3 kg)       PAD Screen 03/30/2019  Previous PAD dx? No  Previous surgical procedure? No  Pain with walking? No  Feet/toe relief with dangling? No  Painful, non-healing ulcers? No  Extremities discolored? No      ASSESSMENT AND PLAN:  1.  Coronary artery disease involving native coronary arteries without angina: He is status post CABG with no anginal symptoms.  Continue medical therapy.  2.  Aortic valve disease status post bioprosthetic aortic valve replacement: Most recent echo in February showed normal functioning valve with mean gradient of 22 mmHg.  The plan is to repeat echo in February 2023.  3.  Essential hypertension: Blood pressure is controlled on current  medications.  4.  Hyperlipidemia: Currently on rosuvastatin 20 mg once daily.  Most recent lipid profile showed an LDL of 70.  5.  Prostate cancer: Status post radiation therapy and hormone therapy.      Disposition:   FU with me in 6 months  Signed,  Kathlyn Sacramento, MD  04/04/2020 2:11 PM    [facility]

## 2020-04-04 NOTE — Patient Instructions (Signed)

## 2020-04-06 ENCOUNTER — Ambulatory Visit: Payer: Medicare Other | Admitting: Cardiovascular Disease

## 2020-04-27 NOTE — Progress Notes (Signed)
  Radiation Oncology         [phone] ________________________________  Name: Guy Phillips MRN: [id number]  Date: 01/13/2020  DOB: 07/01/1941  End of Treatment Note  Diagnosis:   79 y.o.gentleman with Stage T1cadenocarcinoma of the prostate with Gleason score of 3+4, and PSA of8.97.     Indication for treatment:  Curative, Definitive Radiotherapy       Radiation treatment dates:   12/07/19 - 01/13/20  Site/dose:   The prostate was treated to 70 Gy in 28 fractions of 2.5 Gy  Beams/energy:   The patient was treated with IMRT using volumetric arc therapy delivering 6 MV X-rays to clockwise and counterclockwise circumferential arcs with a 90 degree collimator offset to avoid dose scalloping.  Image guidance was performed with daily cone beam CT prior to each fraction to align to gold markers in the prostate and assure proper bladder and rectal fill volumes.  Immobilization was achieved with BodyFix custom mold.  Narrative: The patient tolerated radiation treatment relatively well.   The patient experienced some minor urinary irritation and modest fatigue.  He did experience increased frequency, urgency and UUI with weaker FOS during treatment. He denied gross hematuria or dysuria but did develop loose stools towards the end of treatment.                              Plan: The patient has completed radiation treatment. He will return to radiation oncology clinic for routine followup in one month. I advised him to call or return sooner if he has any questions or concerns related to his recovery or treatment. ________________________________  Sheral Apley. Tammi Klippel, M.D.

## 2020-05-01 ENCOUNTER — Other Ambulatory Visit: Payer: Self-pay | Admitting: Family Medicine

## 2020-05-02 DIAGNOSIS — R3915 Urgency of urination: Secondary | ICD-10-CM | POA: Diagnosis not present

## 2020-05-02 DIAGNOSIS — C61 Malignant neoplasm of prostate: Secondary | ICD-10-CM | POA: Diagnosis not present

## 2020-05-03 ENCOUNTER — Encounter: Payer: Self-pay | Admitting: Family Medicine

## 2020-05-03 ENCOUNTER — Ambulatory Visit (INDEPENDENT_AMBULATORY_CARE_PROVIDER_SITE_OTHER): Payer: Medicare Other | Admitting: Family Medicine

## 2020-05-03 ENCOUNTER — Other Ambulatory Visit: Payer: Self-pay

## 2020-05-03 DIAGNOSIS — I2510 Atherosclerotic heart disease of native coronary artery without angina pectoris: Secondary | ICD-10-CM | POA: Diagnosis not present

## 2020-05-03 DIAGNOSIS — I10 Essential (primary) hypertension: Secondary | ICD-10-CM

## 2020-05-03 DIAGNOSIS — E669 Obesity, unspecified: Secondary | ICD-10-CM

## 2020-05-03 DIAGNOSIS — E1169 Type 2 diabetes mellitus with other specified complication: Secondary | ICD-10-CM

## 2020-05-03 DIAGNOSIS — T148XXA Other injury of unspecified body region, initial encounter: Secondary | ICD-10-CM | POA: Diagnosis not present

## 2020-05-03 DIAGNOSIS — E785 Hyperlipidemia, unspecified: Secondary | ICD-10-CM | POA: Diagnosis not present

## 2020-05-03 DIAGNOSIS — R109 Unspecified abdominal pain: Secondary | ICD-10-CM

## 2020-05-03 LAB — LIPID PANEL
Cholesterol: 150 mg/dL (ref 0–200)
HDL: 38.5 mg/dL — ABNORMAL LOW (ref >39.00)
NonHDL: 111.78
Total CHOL/HDL Ratio: 4
Triglycerides: 310 mg/dL — ABNORMAL HIGH (ref 0.0–149.0)
VLDL: 62 mg/dL — ABNORMAL HIGH (ref 0.0–40.0)

## 2020-05-03 LAB — LDL CHOLESTEROL, DIRECT: Direct LDL: 72 mg/dL

## 2020-05-03 LAB — CBC
HCT: 36.6 % — ABNORMAL LOW (ref 39.0–52.0)
Hemoglobin: 12.5 g/dL — ABNORMAL LOW (ref 13.0–17.0)
MCHC: 34.1 g/dL (ref 30.0–36.0)
MCV: 96.2 fl (ref 78.0–100.0)
Platelets: 180 10*3/uL (ref 150.0–400.0)
RBC: 3.8 Mil/uL — ABNORMAL LOW (ref 4.22–5.81)
RDW: 12.6 % (ref 11.5–15.5)
WBC: 5.8 10*3/uL (ref 4.0–10.5)

## 2020-05-03 LAB — HEMOGLOBIN A1C: Hgb A1c MFr Bld: 6.9 % — ABNORMAL HIGH (ref 4.6–6.5)

## 2020-05-03 NOTE — Assessment & Plan Note (Addendum)
Noted on his arms.  Discussed as we age skin thin's and places you at risk for bruising.  Also discussed that he is on aspirin which compounds that.  He will monitor for other bruising.  Recheck CBC.

## 2020-05-03 NOTE — Progress Notes (Signed)
Tommi Rumps, MD Phone: [phone]  Guy Phillips is a 79 y.o. male who presents today for f/u.  HYPERTENSION Disease Monitoring: Blood pressure range-not checking Chest pain- no      Dyspnea- no Medications: Compliance- taking triamterene/HCTZ, metoprolol   Edema- no  DIABETES Disease Monitoring: Blood Sugar ranges-not checking Polyuria/phagia/dipsia- no      Optho- UTD Medications: Compliance- taking metformin, glipizide Hypoglycemic symptoms- no  HYPERLIPIDEMIA Disease Monitoring: See symptoms for Hypertension Medications: Compliance- taking crestor Right upper quadrant pain- no  Muscle aches- no  Bruising: Patient notes occasional bruising on his arms when he bumps them.  He is on aspirin.  Flank pain: Had this a month or so ago.  Went to the ED and had negative evaluation.  He rested for a couple of days and it went away.   Social History   Tobacco Use  Smoking Status Former Smoker  . Packs/day: 3.00  . Years: 33.00  . Pack years: 99.00  . Types: Cigarettes  . Quit date: 07/03/1991  . Years since quitting: 28.8  Smokeless Tobacco Never Used     ROS see history of present illness  Objective  Physical Exam Vitals:   05/03/20 0954  BP: 120/70  Pulse: 77  Temp: 98.1 F (36.7 C)  SpO2: 99%    BP Readings from Last 3 Encounters:  05/03/20 120/70  04/04/20 110/60  03/26/20 126/60   Wt Readings from Last 3 Encounters:  05/03/20 207 lb 12.8 oz (94.3 kg)  04/04/20 207 lb 2 oz (94 kg)  03/26/20 205 lb (93 kg)    Physical Exam Constitutional:      General: He is not in acute distress.    Appearance: He is not diaphoretic.  Cardiovascular:     Rate and Rhythm: Normal rate and regular rhythm.     Heart sounds: Normal heart sounds.  Pulmonary:     Effort: Pulmonary effort is normal.     Breath sounds: Normal breath sounds.  Musculoskeletal:     Right lower leg: No edema.     Left lower leg: No edema.  Skin:    General: Skin is warm  and dry.     Comments: Scattered bruises on his bilateral arms  Neurological:     Mental Status: He is alert.      Assessment/Plan: Please see individual problem list.  Hypertension Well-controlled.  Continue current regimen.  CAD (coronary artery disease) Asymptomatic.  Continue risk factor management.  Discussed continuing on aspirin.  Diabetes mellitus type 2 in obese (HCC) Check A1c.  Continue current regimen.  Hyperlipidemia Check lipid panel.  Continue Crestor.  Bruising Noted on his arms.  Discussed as we age skin thin's and places you at risk for bruising.  Also discussed that he is on aspirin which compounds that.  He will monitor for other bruising.  Recheck CBC.  Flank pain Possibly related to kidney stone versus muscular strain.  Has not had any recurrence.  Negative work-up in the ED.  Will monitor for recurrence.   Health Maintenance: patient declines hep C screening.  He will get the flu vaccine when it becomes available.  Orders Placed This Encounter  Procedures  . CBC  . HgB A1c  . Lipid panel    No orders of the defined types were placed in this encounter.   This visit occurred during the SARS-CoV-2 public health emergency.  Safety protocols were in place, including screening questions prior to the visit, additional usage of staff PPE, and  extensive cleaning of exam room while observing appropriate contact time as indicated for disinfecting solutions.    Tommi Rumps, MD [place]

## 2020-05-03 NOTE — Assessment & Plan Note (Signed)
Check A1c.  Continue current regimen. 

## 2020-05-03 NOTE — Patient Instructions (Signed)
Nice to see you. We will get lab work today and contact you with the results. Please monitor for bruising elsewhere on your body and let us know if that occurs.

## 2020-05-03 NOTE — Assessment & Plan Note (Signed)
Possibly related to kidney stone versus muscular strain.  Has not had any recurrence.  Negative work-up in the ED.  Will monitor for recurrence.

## 2020-05-03 NOTE — Assessment & Plan Note (Signed)
Well-controlled.  Continue current regimen. 

## 2020-05-03 NOTE — Assessment & Plan Note (Signed)
Check lipid panel.  Continue Crestor.

## 2020-05-03 NOTE — Assessment & Plan Note (Signed)
Asymptomatic.  Continue risk factor management.  Discussed continuing on aspirin.

## 2020-05-04 ENCOUNTER — Other Ambulatory Visit: Payer: Self-pay | Admitting: Family Medicine

## 2020-05-04 DIAGNOSIS — D649 Anemia, unspecified: Secondary | ICD-10-CM | POA: Insufficient documentation

## 2020-05-11 ENCOUNTER — Other Ambulatory Visit: Payer: Self-pay

## 2020-05-11 ENCOUNTER — Other Ambulatory Visit (INDEPENDENT_AMBULATORY_CARE_PROVIDER_SITE_OTHER): Payer: Medicare Other

## 2020-05-11 ENCOUNTER — Other Ambulatory Visit: Payer: Self-pay | Admitting: Family Medicine

## 2020-05-11 DIAGNOSIS — D649 Anemia, unspecified: Secondary | ICD-10-CM

## 2020-05-11 LAB — IBC + FERRITIN
Ferritin: 59.9 ng/mL (ref 22.0–322.0)
Iron: 103 ug/dL (ref 42–165)
Saturation Ratios: 32.7 % (ref 20.0–50.0)
Transferrin: 225 mg/dL (ref 212.0–360.0)

## 2020-05-16 ENCOUNTER — Ambulatory Visit: Payer: Medicare Other | Attending: Internal Medicine

## 2020-05-16 DIAGNOSIS — Z23 Encounter for immunization: Secondary | ICD-10-CM

## 2020-05-16 NOTE — Progress Notes (Signed)
   Covid-19 Vaccination Clinic  Name:  Guy Phillips    MRN: [id number] DOB: [birth year]  05/16/2020  Guy Phillips was observed post Covid-19 immunization for 15 minutes without incident. He was provided with Vaccine Information Sheet and instruction to access the V-Safe system.   Guy Phillips was instructed to call 911 with any severe reactions post vaccine: Marland Kitchen Difficulty breathing  . Swelling of face and throat  . A fast heartbeat  . A bad rash all over body  . Dizziness and weakness

## 2020-05-31 ENCOUNTER — Other Ambulatory Visit: Payer: Self-pay | Admitting: Family Medicine

## 2020-06-01 ENCOUNTER — Other Ambulatory Visit: Payer: Self-pay | Admitting: Family

## 2020-06-01 DIAGNOSIS — I2510 Atherosclerotic heart disease of native coronary artery without angina pectoris: Secondary | ICD-10-CM

## 2020-06-01 DIAGNOSIS — I10 Essential (primary) hypertension: Secondary | ICD-10-CM

## 2020-06-05 ENCOUNTER — Ambulatory Visit (INDEPENDENT_AMBULATORY_CARE_PROVIDER_SITE_OTHER): Payer: Medicare Other

## 2020-06-05 DIAGNOSIS — Z23 Encounter for immunization: Secondary | ICD-10-CM | POA: Diagnosis not present

## 2020-06-07 ENCOUNTER — Other Ambulatory Visit (INDEPENDENT_AMBULATORY_CARE_PROVIDER_SITE_OTHER): Payer: Medicare Other

## 2020-06-07 ENCOUNTER — Other Ambulatory Visit: Payer: Self-pay

## 2020-06-07 DIAGNOSIS — D649 Anemia, unspecified: Secondary | ICD-10-CM

## 2020-06-07 LAB — CBC
HCT: 36.8 % — ABNORMAL LOW (ref 39.0–52.0)
Hemoglobin: 12.4 g/dL — ABNORMAL LOW (ref 13.0–17.0)
MCHC: 33.8 g/dL (ref 30.0–36.0)
MCV: 95.6 fl (ref 78.0–100.0)
Platelets: 185 10*3/uL (ref 150.0–400.0)
RBC: 3.85 Mil/uL — ABNORMAL LOW (ref 4.22–5.81)
RDW: 13.1 % (ref 11.5–15.5)
WBC: 7.2 10*3/uL (ref 4.0–10.5)

## 2020-06-14 ENCOUNTER — Other Ambulatory Visit: Payer: Self-pay | Admitting: Family Medicine

## 2020-06-14 DIAGNOSIS — I10 Essential (primary) hypertension: Secondary | ICD-10-CM

## 2020-07-12 DIAGNOSIS — L723 Sebaceous cyst: Secondary | ICD-10-CM | POA: Diagnosis not present

## 2020-07-12 DIAGNOSIS — L570 Actinic keratosis: Secondary | ICD-10-CM | POA: Diagnosis not present

## 2020-07-12 DIAGNOSIS — Z85828 Personal history of other malignant neoplasm of skin: Secondary | ICD-10-CM | POA: Diagnosis not present

## 2020-07-12 DIAGNOSIS — L821 Other seborrheic keratosis: Secondary | ICD-10-CM | POA: Diagnosis not present

## 2020-07-14 DIAGNOSIS — M6530 Trigger finger, unspecified finger: Secondary | ICD-10-CM | POA: Diagnosis not present

## 2020-07-21 ENCOUNTER — Ambulatory Visit (INDEPENDENT_AMBULATORY_CARE_PROVIDER_SITE_OTHER): Payer: Medicare Other

## 2020-07-21 VITALS — Ht 68.0 in | Wt 204.0 lb

## 2020-07-21 DIAGNOSIS — Z0000 Encounter for general adult medical examination without abnormal findings: Secondary | ICD-10-CM | POA: Diagnosis not present

## 2020-07-21 NOTE — Progress Notes (Signed)
Subjective:   Guy Phillips is a 79 y.o. male who presents for Medicare Annual/Subsequent preventive examination.  Review of Systems    No ROS.  Medicare Wellness Virtual Visit.    Cardiac Risk Factors include: advanced age (>30mn, >>65women);hypertension;diabetes mellitus     Objective:    Today's Vitals   07/21/20 0907  Weight: 204 lb (92.5 kg)  Height: _0  (1.727 m)   Body mass index is 31.02 kg/m.  Advanced Directives 07/21/2020 03/26/2020 09/21/2019 07/21/2019 04/27/2018 08/21/2017 08/21/2017  Does Patient Have a Medical Advance Directive? _1  Yes Yes  Type of AParamed[place]of [place]Living will [place]Living will Living will;Healthcare Power of [place]aly LakeLiving will [place]com[place]Living will [place]Living will HPort MurrayLiving will  Does patient want to make changes to medical advance directive? No - Patient declined No - Patient declined No - Patient declined No - Patient declined No - Patient declined No - Patient declined -  Copy of HCastle Pines Villagein Chart? No - copy requested No - copy requested No - copy requested No - copy requested No - copy requested No - copy requested Yes    Current Medications (verified) Outpatient Encounter Medications as of 07/21/2020  Medication Sig  . acetaminophen (TYLENOL) 325 MG tablet Take by mouth every 6 (six) hours as needed.   .Marland Kitchenaspirin 81 MG tablet Take 81 mg by mouth daily.    . Blood Glucose Monitoring Suppl (ONE TOUCH ULTRA 2) w/Device KIT USE AS DIRECTED  . Blood Glucose Monitoring Suppl (ONE TOUCH ULTRA SYSTEM KIT) w/Device KIT 1 kit by Does not apply route once.  . Cholecalciferol (VITAMIN D3) 2000 UNITS TABS Take 1 tablet by mouth daily.    . Cyanocobalamin (B-12) 5000 MCG CAPS Take by mouth.  . fexofenadine (ALLEGRA) 180 MG tablet Take 180 mg by mouth daily.  .  fluticasone (FLONASE) 50 MCG/ACT nasal spray Place into the nose daily as needed.   .Marland KitchenglipiZIDE (GLUCOTROL) 5 MG tablet TAKE 1 TABLET BY MOUTH TWICE A DAY BEFORE MEALS  . glucose blood (ONE TOUCH ULTRA TEST) test strip USE AS INSTRUCTED  . glucose blood test strip CHECK BLOOD SUGAR ONCE A DAY  . metFORMIN (GLUCOPHAGE) 1000 MG tablet TAKE 1 TABLET TWICE DAILY  WITH MEALS.  . metoprolol tartrate (LOPRESSOR) 25 MG tablet TAKE 0.5 TABLETS (12.5 MG TOTAL) BY MOUTH 2 (TWO) TIMES DAILY.  . Multiple Vitamin (MULTIVITAMIN) tablet Take 1 tablet by mouth daily.    . nitroGLYCERIN (NITROSTAT) 0.4 MG SL tablet Place 1 tablet (0.4 mg total) under the tongue every 5 (five) minutes as needed.  . rosuvastatin (CRESTOR) 20 MG tablet TAKE 1 TABLET DAILY  . tamsulosin (FLOMAX) 0.4 MG CAPS capsule Take 0.4 mg by mouth daily.  . traMADol (ULTRAM) 50 MG tablet Take 1 tablet (50 mg total) by mouth every 8 (eight) hours as needed for moderate pain or severe pain.  .Marland Kitchentriamterene-hydrochlorothiazide (MAXZIDE-25) 37.5-25 MG tablet TAKE 1 TABLET BY MOUTH 1 DAY OR 1 DOSE.   No facility-administered encounter medications on file as of 07/21/2020.    Allergies (verified) Grapefruit extract, Other, and Pollen extract   History: Past Medical History:  Diagnosis Date  . Acute cholecystitis 08/21/2017  . CAD (coronary artery disease)    s/p MI 2011 Cath showed 100% occlusion of RCA with collaterals open, followed by Dr. FUbaldo Glassing . Cerebrovascular  accident (CVA) due to embolism of left middle cerebral artery ([place]) 06/29/2014   Overview:  Post op event 04/2014  . Conjunctival cyst of left eye 06/26/2012  . Dental bridge present    also caps and implants  . Diabetes mellitus Dx 2005   HgbA1c 6.8%, Optho [facility] 2001  . Hernia    Umbilical  . Hyperlipidemia   . Hypertension   . Kidney stone    2008  . Prostate cancer ([place])   . Squamous cell cancer of scalp and skin of neck    [facility]  . Stroke  ([place]) 05/20/2014   small left hemispheric cortical stroke following CABG.  No deficits  . Uses hearing aid    has.  Doesn't usually wear   Past Surgical History:  Procedure Laterality Date  . CHOLECYSTECTOMY N/A 08/23/2017   Procedure: LAPAROSCOPIC CHOLECYSTECTOMY;  Surgeon: Vickie Epley, MD;  Location: [facility] ORS;  Service: General;  Laterality: N/A;  . COLONOSCOPY WITH PROPOFOL N/A 04/28/2017   Procedure: COLONOSCOPY WITH PROPOFOL;  Surgeon: Lucilla Lame, MD;  Location: [place];  Service: Gastroenterology;  Laterality: N/A;  Diabetic - oral meds  . CORONARY ARTERY BYPASS GRAFT  2015   x3 vessels, with AVR  . EYE SURGERY     cataract extraction, bilateral  . FUNCTIONAL ENDOSCOPIC SINUS SURGERY     Dr. Fatima Sanger in [place]  . POLYPECTOMY  04/28/2017   Procedure: POLYPECTOMY;  Surgeon: Lucilla Lame, MD;  Location: [place];  Service: Gastroenterology;;   Family History  Problem Relation Age of Onset  . Cancer Father        liver  . Colon cancer Neg Hx   . Prostate cancer Neg Hx   . Pancreatic cancer Neg Hx   . Breast cancer Neg Hx    Social History   Socioeconomic History  . Marital status: Married    Spouse name: Not on file  . Number of children: Not on file  . Years of education: Not on file  . Highest education level: Not on file  Occupational History  . Not on file  Tobacco Use  . Smoking status: Former Smoker    Packs/day: 3.00    Years: 33.00    Pack years: 99.00    Types: Cigarettes    Quit date: 07/03/1991    Years since quitting: 29.0  . Smokeless tobacco: Never Used  Vaping Use  . Vaping Use: Never used  Substance and Sexual Activity  . Alcohol use: Not Currently    Comment: 1 glass of wine occ  . Drug use: No  . Sexual activity: Not Currently  Other Topics Concern  . Not on file  Social History Narrative  . Not on file   Social Determinants of Health   Financial Resource Strain:   . Difficulty of Paying Living Expenses: Not on file   Food Insecurity:   . Worried About Charity fundraiser in the Last Year: Not on file  . Ran Out of Food in the Last Year: Not on file  Transportation Needs:   . Lack of Transportation (Medical): Not on file  . Lack of Transportation (Non-Medical): Not on file  Physical Activity:   . Days of Exercise per Week: Not on file  . Minutes of Exercise per Session: Not on file  Stress:   . Feeling of Stress : Not on file  Social Connections:   . Frequency of Communication with Friends and Family: Not on  file  . Frequency of Social Gatherings with Friends and Family: Not on file  . Attends Religious Services: Not on file  . Active Member of Clubs or Organizations: Not on file  . Attends Archivist Meetings: Not on file  . Marital Status: Not on file    Tobacco Counseling Counseling given: Not Answered   Clinical Intake:  Pre-visit preparation completed: Yes        Diabetes: Yes (Followed by pcp)  How often do you need to have someone help you when you read instructions, pamphlets, or other written materials from your doctor or pharmacy?: 1 - Never Nutrition Risk Assessment: Has the patient had any N/V/D within the last 2 months?  No  Does the patient have any non-healing wounds?  No  Has the patient had any unintentional weight loss or weight gain?  No   Diabetes: If diabetic, was a CBG obtained today?  No. Patient has not checked BS today as of yet. Did the patient bring in their glucometer from home?  No . Virtual visit.   Financial Strains and Diabetes Management: Are you having any financial strains with the device, your supplies or your medication? No .  Does the patient want to be seen by Chronic Care Management for management of their diabetes?  No  Would the patient like to be referred to a Nutritionist or for Diabetic Management?  No   Diabetic Exams: Diabetic Eye Exam: Completed 08/30/20 Diabetic Foot Exam: Followed by pcp. Denies wounds, numbness  tingling or new symptoms presenting. Next scheduled ofc visit 11/07/20.  Interpreter Needed?: No    Activities of Daily Living In your present state of health, do you have any difficulty performing the following activities: 07/21/2020  Hearing? N  Vision? N  Difficulty concentrating or making decisions? N  Walking or climbing stairs? N  Dressing or bathing? N  Doing errands, shopping? N  Preparing Food and eating ? N  Using the Toilet? N  In the past six months, have you accidently leaked urine? N  Comment Followed by Dr. Tresa Moore  Do you have problems with loss of bowel control? N  Managing your Medications? N  Managing your Finances? N  Housekeeping or managing your Housekeeping? N  Some recent data might be hidden    Patient Care Team: Leone Haven, MD as PCP - General (Family Medicine) Wellington Hampshire, MD as PCP - Cardiology (Cardiology) Cira Rue, RN Nurse Navigator as Registered Nurse (Medical Oncology)  Indicate any recent [place] you may have received from other than [facility] providers in the past year (date may be approximate).     Assessment:   This is a routine wellness examination for Luther.  I connected with Agostino today by telephone and verified that I am speaking with the correct person using two identifiers. Location patient: home Location provider: work Persons participating in the virtual visit: patient, Marine scientist.    I discussed the limitations, risks, security and privacy concerns of performing an evaluation and management service by telephone and the availability of in person appointments. The patient expressed understanding and verbally consented to this telephonic visit.    Interactive audio and video telecommunications were attempted between this provider and patient, however failed, due to patient having technical difficulties OR patient did not have access to video capability.  We continued and completed visit with audio only.  Some vital  signs may be absent or patient reported.   Hearing/Vision screen  Hearing Screening  _0  _1  _2  _3  _4  _5  _6  _7  _8   Right ear:           Left ear:           Comments: Patient has difficulty hearing low frequency tones; hears normal conversation tones without an issue. He has hearing aids but does not wear them.   Vision Screening Comments: Wears corrective lenses  Cataract extraction, bilateral  Visual acuity not assessed, virtual visit. They have seen their ophthalmologist in the last 12 months.   Dietary issues and exercise activities discussed: Current Exercise Habits: The patient does not participate in regular exercise at present  Healthy diet Good water intake  Goals    . Maintain Healthy Lifestyle     Stay hydrated Stay active Healthy diet Brain engagement activity      Depression Screen PHQ 2/9 Scores 07/21/2020 05/03/2020 02/01/2020 07/21/2019 07/21/2019 06/01/2019 07/20/2018  PHQ - 2 Score 0 0 0 0 0 0 0    Fall Risk Fall Risk  07/21/2020 05/03/2020 02/01/2020 07/21/2019 07/21/2019  Falls in the past year? 0 0 0 0 0  Number falls in past yr: 0 0 0 0 -  Follow up Falls evaluation completed Falls evaluation completed Falls evaluation completed Falls evaluation completed -   Handrails in use when climbing stairs? Yes Home free of loose throw rugs in walkways, pet beds, electrical cords, etc? Yes  Adequate lighting in your home to reduce risk of falls? Yes   ASSISTIVE DEVICES UTILIZED TO PREVENT FALLS: Life alert? No  Use of a cane, walker or w/c? No   TIMED UP AND GO: Was the test performed? No . Virtual visit.   Cognitive Function: MMSE - Mini Mental State Exam 04/27/2018 04/25/2017 04/24/2016 04/24/2015  Orientation to time _9 Orientation to Place _10 Registration _11 Attention/ Calculation _12 Recall _13 Language- name 2 objects _14 Language- repeat _15 Language- follow 3 step command _16 Language- read & follow direction _17 Write a sentence _18 Copy design _19 Total score _20 6CIT Screen 07/21/2020 07/21/2019  What Year? 0 points 0 points  What month? 0 points 0 points  What time? - 0 points  Count back from 20 0 points 0 points  Months in reverse 0 points 0 points  Repeat phrase 0 points 0 points  Total Score - 0   Immunizations Immunization History  Administered Date(s) Administered  . Fluad Quad(high Dose 65+) 05/28/2019, 06/05/2020  . Influenza Split 07/03/2011, 06/16/2012  . Influenza, High Dose Seasonal PF 06/04/2016, 05/20/2017, 05/24/2018, 06/18/2018  . Influenza,inj,Quad PF,6+ Mos 06/16/2013, 07/01/2014, 06/28/2015  . Influenza-Unspecified 06/28/2014  . PFIZER SARS-COV-2 Vaccination 10/07/2019, 10/25/2019, 05/16/2020  . Pneumococcal Conjugate-13 12/28/2013  . Pneumococcal Polysaccharide-23 10/08/2011  . Tdap 10/08/2011  . Zoster 07/29/2008   Health Maintenance Health Maintenance  Topic Date Due  . FOOT EXAM  07/20/2020  . URINE MICROALBUMIN  07/20/2020  . OPHTHALMOLOGY EXAM  08/30/2020  . HEMOGLOBIN A1C  11/03/2020  . TETANUS/TDAP  10/07/2021  . COLONOSCOPY  04/28/2022  . INFLUENZA VACCINE  Completed  . COVID-19 Vaccine  Completed  . PNA vac Low Risk Adult  Completed   Colorectal cancer screening: Completed 04/28/17. Repeat every 5 years  Hepatitis C Screening: does not qualify.  Vision Screening: Recommended annual ophthalmology exams for early detection of glaucoma and other disorders of the eye. Is the patient up to date with their annual eye exam?  Yes  Who is the provider or what is the name of the office in which the patient attends annual eye exams? [facility]  Dental Screening: Recommended annual dental exams for proper oral hygiene.  Visits every 6-8 months.   Community Resource Referral / Chronic Care Management: CRR required this visit?  No   CCM required this visit?  No      Plan:   Keep  all routine maintenance appointments.   Follow up 11/07/20  I have personally reviewed and noted the following in the patient's chart:   . Medical and social history . Use of alcohol, tobacco or illicit drugs  . Current medications and supplements . Functional ability and status . Nutritional status . Physical activity . Advanced directives . List of other physicians . Hospitalizations, surgeries, and ER visits in previous 12 months . Vitals . Screenings to include cognitive, depression, and falls . Referrals and appointments  In addition, I have reviewed and discussed with patient certain preventive protocols, quality metrics, and best practice recommendations. A written personalized care plan for preventive services as well as general preventive health recommendations were provided to patient via mychart.     Varney Biles, LPN   [id number]/10/[id number]

## 2020-07-21 NOTE — Patient Instructions (Addendum)
Guy Phillips , Thank you for taking time to come for your Medicare Wellness Visit. I appreciate your ongoing commitment to your health goals. Please review the following plan we discussed and let me know if I can assist you in the future.   These are the goals we discussed: Goals    . Maintain Healthy Lifestyle     Stay hydrated Stay active Healthy diet Brain engagement activity       This is a list of the screening recommended for you and due dates:  Health Maintenance  Topic Date Due  . Complete foot exam   07/20/2020  . Urine Protein Check  07/20/2020  . Eye exam for diabetics  08/30/2020  . Hemoglobin A1C  11/03/2020  . Tetanus Vaccine  10/07/2021  . Colon Cancer Screening  04/28/2022  . Flu Shot  Completed  . COVID-19 Vaccine  Completed  . Pneumonia vaccines  Completed    Immunizations Immunization History  Administered Date(s) Administered  . Fluad Quad(high Dose 65+) 05/28/2019, 06/05/2020  . Influenza Split 07/03/2011, 06/16/2012  . Influenza, High Dose Seasonal PF 06/04/2016, 05/20/2017, 05/24/2018, 06/18/2018  . Influenza,inj,Quad PF,6+ Mos 06/16/2013, 07/01/2014, 06/28/2015  . Influenza-Unspecified 06/28/2014  . PFIZER SARS-COV-2 Vaccination 10/07/2019, 10/25/2019, 05/16/2020  . Pneumococcal Conjugate-13 12/28/2013  . Pneumococcal Polysaccharide-23 10/08/2011  . Tdap 10/08/2011  . Zoster 07/29/2008   Keep all routine maintenance appointments.   Follow up 11/07/20  Advanced directives: End of life planning; Advance aging; Advanced directives discussed.  Copy of current HCPOA/Living Will requested.    Conditions/risks identified: none new.  Follow up in one year for your annual wellness visit.   Preventive Care 78 Years and Older, Male Preventive care refers to lifestyle choices and visits with your health care provider that can promote health and wellness. What does preventive care include?  A yearly physical exam. This is also called an annual well  check.  Dental exams once or twice a year.  Routine eye exams. Ask your health care provider how often you should have your eyes checked.  Personal lifestyle choices, including:  Daily care of your teeth and gums.  Regular physical activity.  Eating a healthy diet.  Avoiding tobacco and drug use.  Limiting alcohol use.  Practicing safe sex.  Taking low doses of aspirin every day.  Taking vitamin and mineral supplements as recommended by your health care provider. What happens during an annual well check? The services and screenings done by your health care provider during your annual well check will depend on your age, overall health, lifestyle risk factors, and family history of disease. Counseling  Your health care provider may ask you questions about your:  Alcohol use.  Tobacco use.  Drug use.  Emotional well-being.  Home and relationship well-being.  Sexual activity.  Eating habits.  History of falls.  Memory and ability to understand (cognition).  Work and work Statistician. Screening  You may have the following tests or measurements:  Height, weight, and BMI.  Blood pressure.  Lipid and cholesterol levels. These may be checked every 5 years, or more frequently if you are over 60 years old.  Skin check.  Lung cancer screening. You may have this screening every year starting at age 15 if you have a 30-pack-year history of smoking and currently smoke or have quit within the past 15 years.  Fecal occult blood test (FOBT) of the stool. You may have this test every year starting at age 86.  Flexible sigmoidoscopy or colonoscopy. You  may have a sigmoidoscopy every 5 years or a colonoscopy every 10 years starting at age 45.  Prostate cancer screening. Recommendations will vary depending on your family history and other risks.  Hepatitis C blood test.  Hepatitis B blood test.  Sexually transmitted disease (STD) testing.  Diabetes screening. This is  done by checking your blood sugar (glucose) after you have not eaten for a while (fasting). You may have this done every 1-3 years.  Abdominal aortic aneurysm (AAA) screening. You may need this if you are a current or former smoker.  Osteoporosis. You may be screened starting at age 5 if you are at high risk. Talk with your health care provider about your test results, treatment options, and if necessary, the need for more tests. Vaccines  Your health care provider may recommend certain vaccines, such as:  Influenza vaccine. This is recommended every year.  Tetanus, diphtheria, and acellular pertussis (Tdap, Td) vaccine. You may need a Td booster every 10 years.  Zoster vaccine. You may need this after age 67.  Pneumococcal 13-valent conjugate (PCV13) vaccine. One dose is recommended after age 29.  Pneumococcal polysaccharide (PPSV23) vaccine. One dose is recommended after age 23. Talk to your health care provider about which screenings and vaccines you need and how often you need them. This information is not intended to replace advice given to you by your health care provider. Make sure you discuss any questions you have with your health care provider. Document Released: 10/06/2015 Document Revised: 05/29/2016 Document Reviewed: 07/11/2015 Elsevier Interactive Patient Education  2017 [place] Prevention in the Home Falls can cause injuries. They can happen to people of all ages. There are many things you can do to make your home safe and to help prevent falls. What can I do on the outside of my home?  Regularly fix the edges of walkways and driveways and fix any cracks.  Remove anything that might make you trip as you walk through a door, such as a raised step or threshold.  Trim any bushes or trees on the path to your home.  Use bright outdoor lighting.  Clear any walking paths of anything that might make someone trip, such as rocks or tools.  Regularly check to  see if handrails are loose or broken. Make sure that both sides of any steps have handrails.  Any raised decks and porches should have guardrails on the edges.  Have any leaves, snow, or ice cleared regularly.  Use sand or salt on walking paths during winter.  Clean up any spills in your garage right away. This includes oil or grease spills. What can I do in the bathroom?  Use night lights.  Install grab bars by the toilet and in the tub and shower. Do not use towel bars as grab bars.  Use non-skid mats or decals in the tub or shower.  If you need to sit down in the shower, use a plastic, non-slip stool.  Keep the floor dry. Clean up any water that spills on the floor as soon as it happens.  Remove soap buildup in the tub or shower regularly.  Attach bath mats securely with double-sided non-slip rug tape.  Do not have throw rugs and other things on the floor that can make you trip. What can I do in the bedroom?  Use night lights.  Make sure that you have a light by your bed that is easy to reach.  Do not use any sheets or  blankets that are too big for your bed. They should not hang down onto the floor.  Have a firm chair that has side arms. You can use this for support while you get dressed.  Do not have throw rugs and other things on the floor that can make you trip. What can I do in the kitchen?  Clean up any spills right away.  Avoid walking on wet floors.  Keep items that you use a lot in easy-to-reach places.  If you need to reach something above you, use a strong step stool that has a grab bar.  Keep electrical cords out of the way.  Do not use floor polish or wax that makes floors slippery. If you must use wax, use non-skid floor wax.  Do not have throw rugs and other things on the floor that can make you trip. What can I do with my stairs?  Do not leave any items on the stairs.  Make sure that there are handrails on both sides of the stairs and use  them. Fix handrails that are broken or loose. Make sure that handrails are as long as the stairways.  Check any carpeting to make sure that it is firmly attached to the stairs. Fix any carpet that is loose or worn.  Avoid having throw rugs at the top or bottom of the stairs. If you do have throw rugs, attach them to the floor with carpet tape.  Make sure that you have a light switch at the top of the stairs and the bottom of the stairs. If you do not have them, ask someone to add them for you. What else can I do to help prevent falls?  Wear shoes that:  Do not have high heels.  Have rubber bottoms.  Are comfortable and fit you well.  Are closed at the toe. Do not wear sandals.  If you use a stepladder:  Make sure that it is fully opened. Do not climb a closed stepladder.  Make sure that both sides of the stepladder are locked into place.  Ask someone to hold it for you, if possible.  Clearly mark and make sure that you can see:  Any grab bars or handrails.  First and last steps.  Where the edge of each step is.  Use tools that help you move around (mobility aids) if they are needed. These include:  Canes.  Walkers.  Scooters.  Crutches.  Turn on the lights when you go into a dark area. Replace any light bulbs as soon as they burn out.  Set up your furniture so you have a clear path. Avoid moving your furniture around.  If any of your floors are uneven, fix them.  If there are any pets around you, be aware of where they are.  Review your medicines with your doctor. Some medicines can make you feel dizzy. This can increase your chance of falling. Ask your doctor what other things that you can do to help prevent falls. This information is not intended to replace advice given to you by your health care provider. Make sure you discuss any questions you have with your health care provider. Document Released: 07/06/2009 Document Revised: 02/15/2016 Document Reviewed:  10/14/2014 Elsevier Interactive Patient Education  2017 [employer].

## 2020-09-22 ENCOUNTER — Encounter: Payer: Self-pay | Admitting: Medical Oncology

## 2020-09-25 ENCOUNTER — Other Ambulatory Visit: Payer: Self-pay | Admitting: Family Medicine

## 2020-10-18 IMAGING — CT CT ABD-PELV W/ CM
2 of 5 series · 17 of 46 positions shown, 19 images · IV contrast (APPLIED)
Comparison: August 21, 2017

CLINICAL DATA: Left flank pain starting 3 days ago.

EXAM:
CT ABDOMEN AND PELVIS WITH CONTRAST
TECHNIQUE: Multidetector CT imaging of the abdomen and pelvis was performed
using the standard protocol following bolus administration of
intravenous contrast.
CONTRAST:  100mL OMNIPAQUE IOHEXOL 300 MG/ML  SOLN

[Series 2: routine abd/pel with · axial · 0.81mm/px · z∈[-474,-44]mm · 14 of 98 slices shown, 16 images]
[im 6/98  soft-tissue]
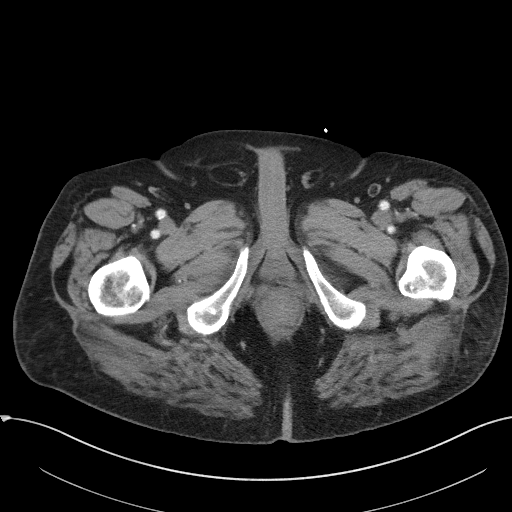
[im 6/98  bone]
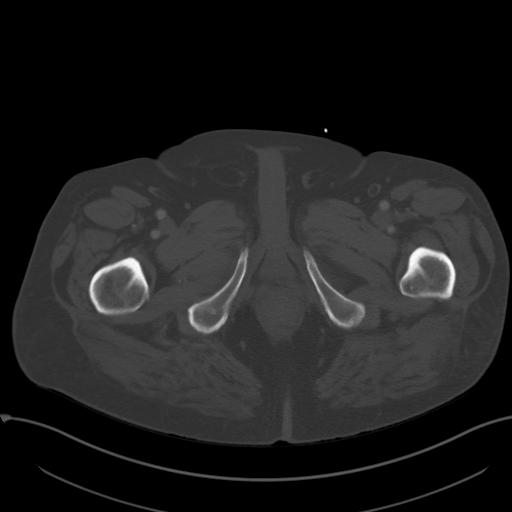
[im 11/98  soft-tissue]
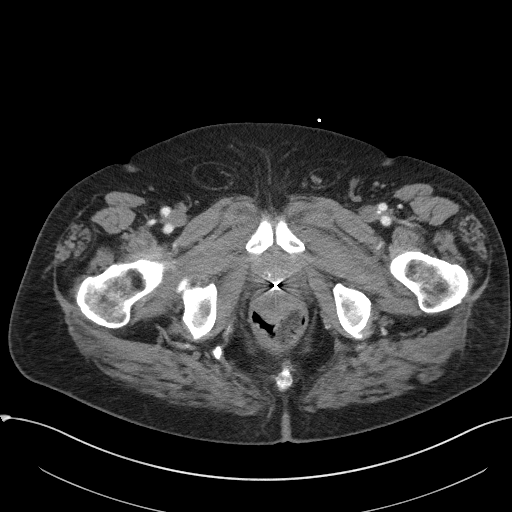
[im 21/98  soft-tissue]
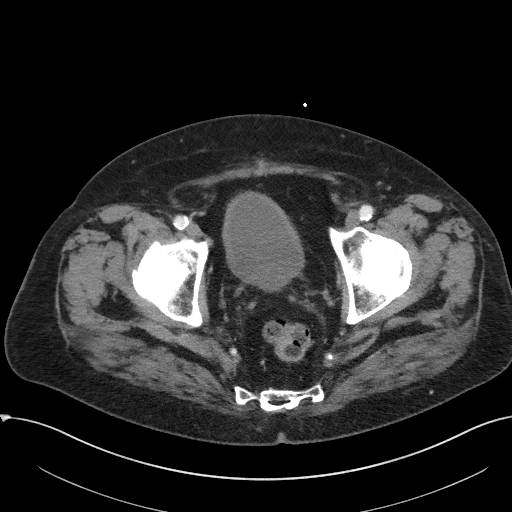
[im 26/98  soft-tissue]
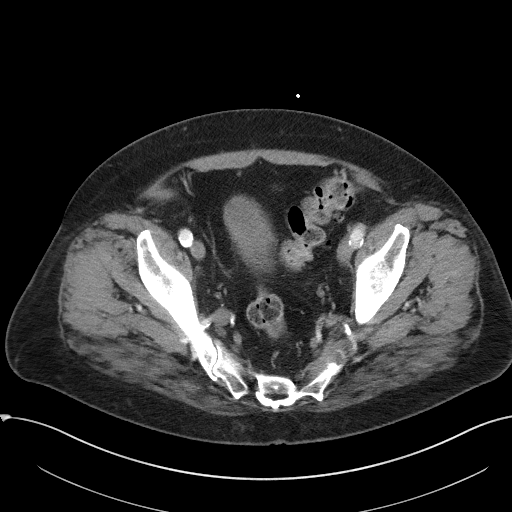
[im 31/98  soft-tissue]
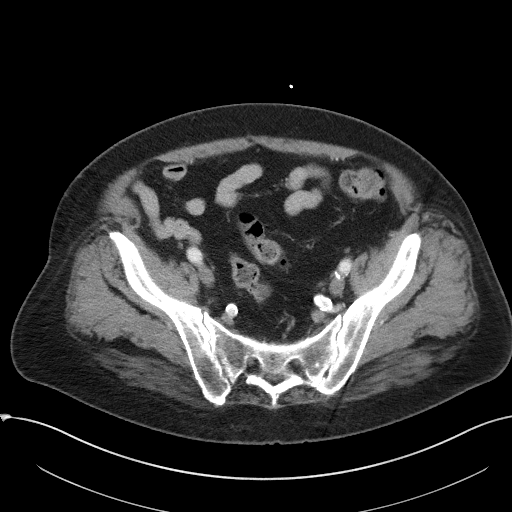
[im 41/98  soft-tissue]
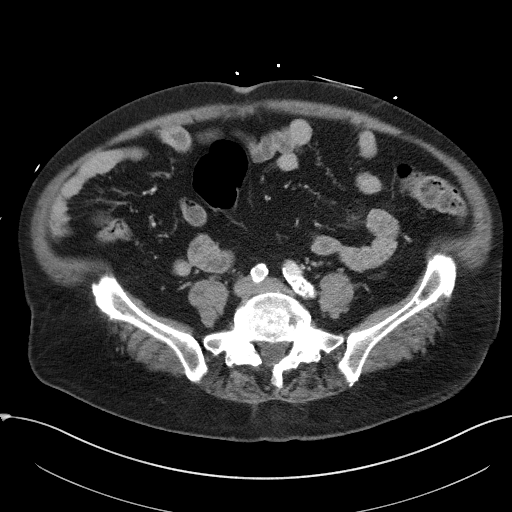
[im 46/98  soft-tissue]
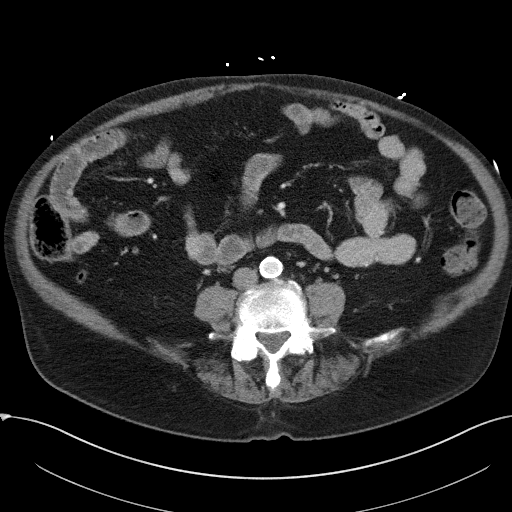
[im 52/98  soft-tissue]
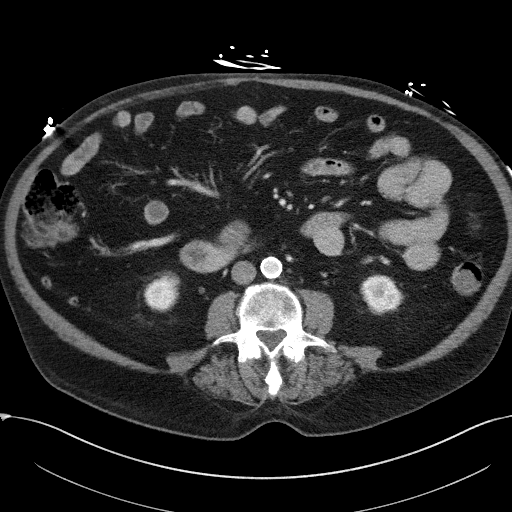
[im 57/98  soft-tissue]
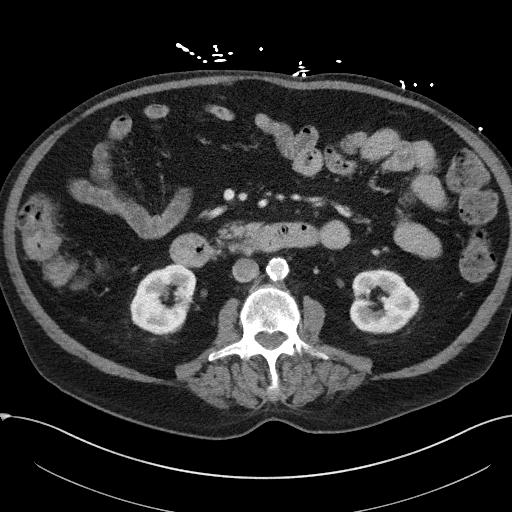
[im 57/98  bone]
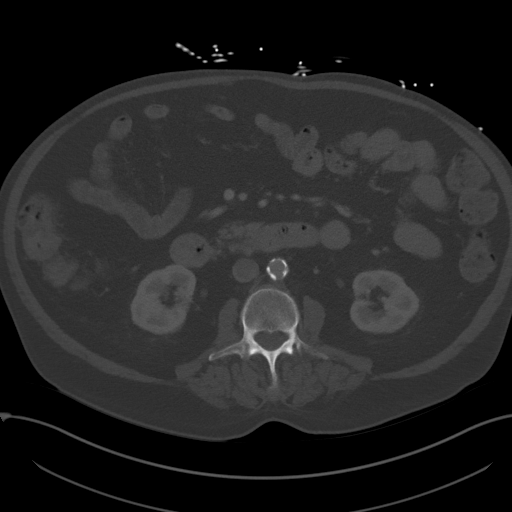
[im 67/98  soft-tissue]
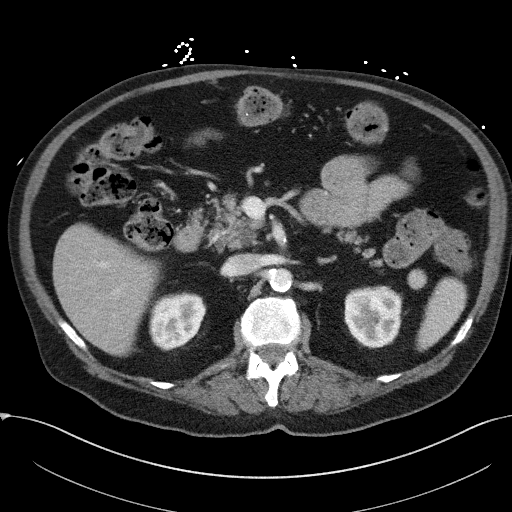
[im 72/98  soft-tissue]
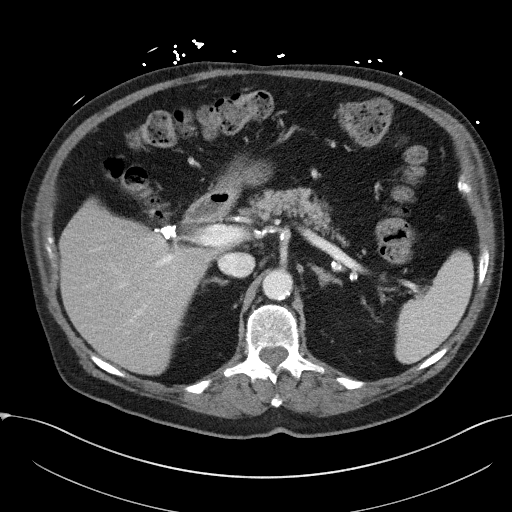
[im 77/98  soft-tissue]
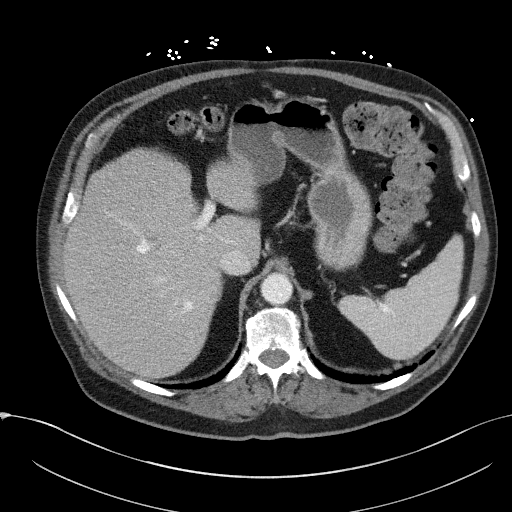
[im 87/98  soft-tissue]
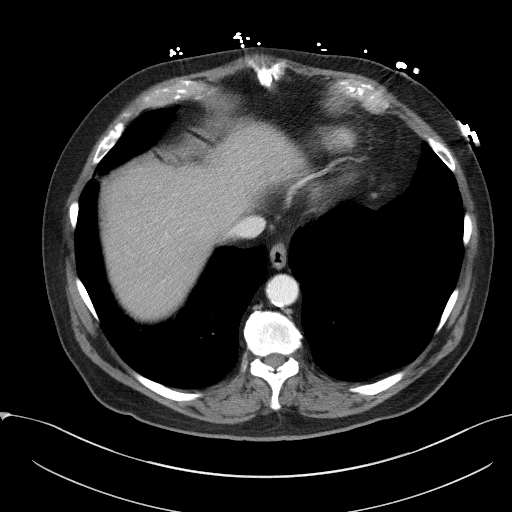
[im 92/98  soft-tissue]
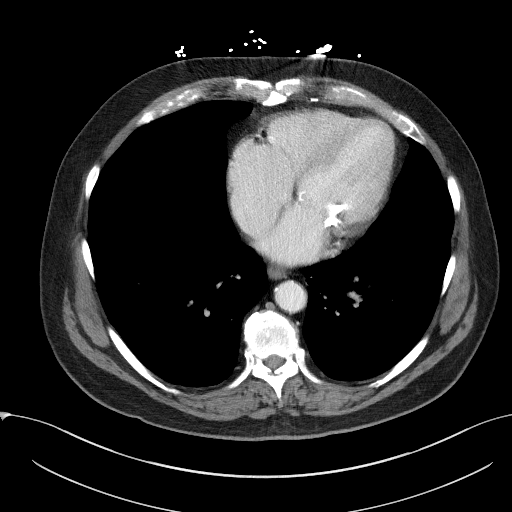

[Series 5: coronal st · coronal · 0.86mm/px · 3 of 105 slices shown]
[im 35/105  soft-tissue]
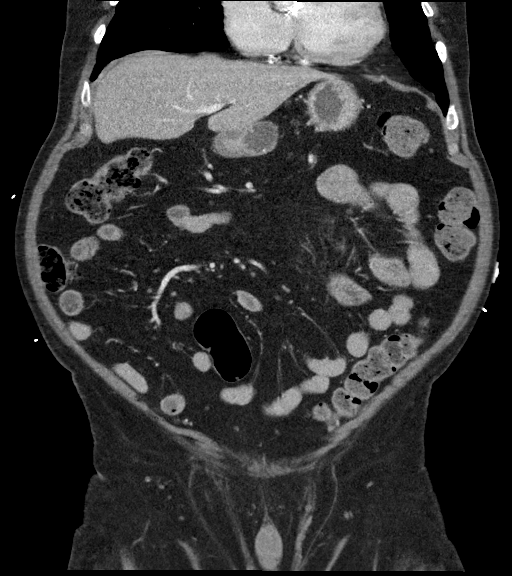
[im 47/105  soft-tissue]
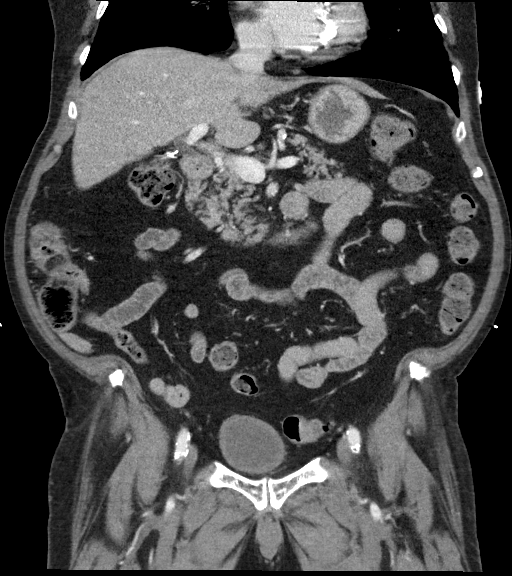
[im 58/105  soft-tissue]
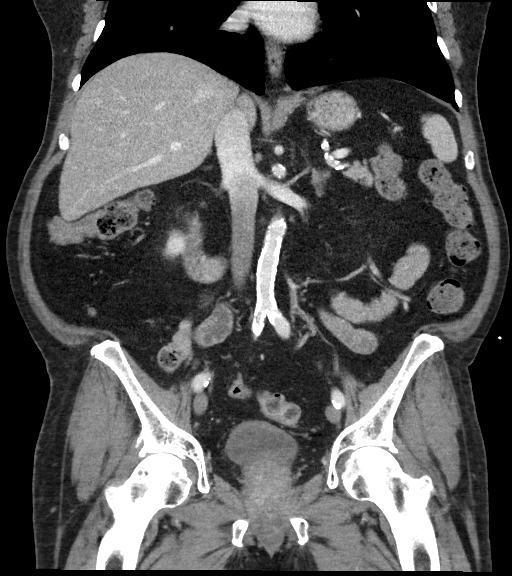

[17 of 46 positions shown; findings below may reference images not displayed]

FINDINGS: Lower chest: No acute abnormality.

Hepatobiliary: No focal liver abnormality is seen. Status post
cholecystectomy. No biliary dilatation.

Pancreas: Unremarkable. No pancreatic ductal dilatation or
surrounding inflammatory changes.

Spleen: Normal in size without focal abnormality.

Adrenals/Urinary Tract: Adrenal glands are unremarkable. Kidneys are
normal, without renal calculi, focal lesion, or hydronephrosis.
Bladder is unremarkable.

Stomach/Bowel: Stomach is within normal limits. Appendix appears
normal. No evidence of bowel wall thickening, distention, or
inflammatory changes. Moderate bowel content is identified
throughout colon

Vascular/Lymphatic: Aortic atherosclerosis. No enlarged abdominal or
pelvic lymph nodes.

Reproductive: Prostate calcifications are identified.

Other: Bilateral inguinal herniation of mesenteric fat are noted.

Musculoskeletal: Degenerative joint changes of the spine are noted.
IMPRESSION: 1. No acute abnormality identified in the abdomen and pelvis.
2. Moderate bowel content is identified throughout colon. This can
be seen in constipation.
3. Aortic atherosclerosis.

Aortic Atherosclerosis (ODDT2-VAJ.J).

## 2020-10-26 ENCOUNTER — Other Ambulatory Visit: Payer: Self-pay | Admitting: Family Medicine

## 2020-10-31 DIAGNOSIS — R3915 Urgency of urination: Secondary | ICD-10-CM | POA: Diagnosis not present

## 2020-10-31 DIAGNOSIS — C61 Malignant neoplasm of prostate: Secondary | ICD-10-CM | POA: Diagnosis not present

## 2020-11-03 ENCOUNTER — Other Ambulatory Visit: Payer: Self-pay

## 2020-11-07 ENCOUNTER — Other Ambulatory Visit: Payer: Self-pay

## 2020-11-07 ENCOUNTER — Telehealth (INDEPENDENT_AMBULATORY_CARE_PROVIDER_SITE_OTHER): Payer: Medicare Other | Admitting: Family Medicine

## 2020-11-07 DIAGNOSIS — C61 Malignant neoplasm of prostate: Secondary | ICD-10-CM

## 2020-11-07 DIAGNOSIS — Z8616 Personal history of COVID-19: Secondary | ICD-10-CM | POA: Diagnosis not present

## 2020-11-07 DIAGNOSIS — E669 Obesity, unspecified: Secondary | ICD-10-CM

## 2020-11-07 DIAGNOSIS — E1169 Type 2 diabetes mellitus with other specified complication: Secondary | ICD-10-CM | POA: Diagnosis not present

## 2020-11-07 DIAGNOSIS — I10 Essential (primary) hypertension: Secondary | ICD-10-CM | POA: Diagnosis not present

## 2020-11-07 DIAGNOSIS — U071 COVID-19: Secondary | ICD-10-CM | POA: Insufficient documentation

## 2020-11-07 NOTE — Assessment & Plan Note (Signed)
The patient is completing his home quarantine.  He is currently asymptomatic.  Discussed wearing a mask after he comes off of quarantine.  He will monitor for any recurrence of symptoms.

## 2020-11-07 NOTE — Progress Notes (Signed)
Virtual Visit via telephone Note  This visit type was conducted due to national recommendations for restrictions regarding the COVID-19 pandemic (e.g. social distancing).  This format is felt to be most appropriate for this patient at this time.  All issues noted in this document were discussed and addressed.  No physical exam was performed (except for noted visual exam findings with Video Visits).   I connected with Guy Phillips today at 10:00 AM EST by telephone and verified that I am speaking with the correct person using two identifiers. Location patient: home Location provider: work  Persons participating in the virtual visit: patient, provider, Devarion Mcclanahan (wife)  I discussed the limitations, risks, security and privacy concerns of performing an evaluation and management service by telephone and the availability of in person appointments. I also discussed with the patient that there may be a patient responsible charge related to this service. The patient expressed understanding and agreed to proceed.  Interactive audio and video telecommunications were attempted between this provider and patient, however failed, due to patient having technical difficulties OR patient did not have access to video capability.  We continued and completed visit with audio only.   Reason for visit: same day visit  HPI: COVID-19: Patient notes onset of symptoms about 2 weeks ago.  He had a home COVID test that was positive sometime the middle of last week.  He notes today was the last day of quarantine for him.  He felt bad with body aches and loss of appetite for 2 to 3 days and then his symptoms resolved.  He noted no cough, congestion, fevers, shortness of breath, or taste or smell disturbances.  His wife had COVID as well.  He is fully vaccinated and has received his booster vaccine.  He did not require any medications for this.  Hypertension: Not checking blood pressures.  Taking  triamterene/HCTZ and metoprolol.  No chest pain, shortness of breath, or edema.  Diabetes: Not checking sugars.  He is taking glipizide and Metformin.  Some urinary leakage from his prostate issues though no increase in urination or polydipsia.  He rarely feels hypoglycemic where he will get weak, sweaty, and have blurry vision.  He will take glucose tablets and that resolves the symptoms.  He is due to see ophthalmology.  He has been eating healthier foods recently with less carbohydrates.  History of prostate cancer: Some urine incontinence related to his radiation therapy.  He is on Myrbetriq and notes that is beneficial for this.   ROS: See pertinent positives and negatives per HPI.  Past Medical History:  Diagnosis Date  . Acute cholecystitis 08/21/2017  . CAD (coronary artery disease)    s/p MI 2011 Cath showed 100% occlusion of RCA with collaterals open, followed by Dr. Ubaldo Glassing  . Cerebrovascular accident (CVA) due to embolism of left middle cerebral artery ([place]) 06/29/2014   Overview:  Post op event 04/2014  . Conjunctival cyst of left eye 06/26/2012  . Dental bridge present    also caps and implants  . Diabetes mellitus Dx 2005   HgbA1c 6.8%, [facility] 2001  . Hernia    Umbilical  . Hyperlipidemia   . Hypertension   . Kidney stone    2008  . Prostate cancer ([place])   . Squamous cell cancer of scalp and skin of neck    [facility]  . Stroke ([place]) 05/20/2014   small left hemispheric cortical stroke following CABG.  No deficits  .  Uses hearing aid    has.  Doesn't usually wear    Past Surgical History:  Procedure Laterality Date  . CHOLECYSTECTOMY N/A 08/23/2017   Procedure: LAPAROSCOPIC CHOLECYSTECTOMY;  Surgeon: Vickie Epley, MD;  Location: [facility] ORS;  Service: General;  Laterality: N/A;  . COLONOSCOPY WITH PROPOFOL N/A 04/28/2017   Procedure: COLONOSCOPY WITH PROPOFOL;  Surgeon: Lucilla Lame, MD;  Location: [place];  Service:  Gastroenterology;  Laterality: N/A;  Diabetic - oral meds  . CORONARY ARTERY BYPASS GRAFT  2015   x3 vessels, with AVR  . EYE SURGERY     cataract extraction, bilateral  . FUNCTIONAL ENDOSCOPIC SINUS SURGERY     Dr. Fatima Sanger in [place]  . POLYPECTOMY  04/28/2017   Procedure: POLYPECTOMY;  Surgeon: Lucilla Lame, MD;  Location: [place];  Service: Gastroenterology;;    Family History  Problem Relation Age of Onset  . Cancer Father        liver  . Colon cancer Neg Hx   . Prostate cancer Neg Hx   . Pancreatic cancer Neg Hx   . Breast cancer Neg Hx     SOCIAL HX: Former smoker   Current Outpatient Medications:  .  acetaminophen (TYLENOL) 325 MG tablet, Take by mouth every 6 (six) hours as needed. , Disp: , Rfl:  .  aspirin 81 MG tablet, Take 81 mg by mouth daily.  , Disp: , Rfl:  .  Blood Glucose Monitoring Suppl (ONE TOUCH ULTRA 2) w/Device KIT, USE AS DIRECTED, Disp: , Rfl:  .  Blood Glucose Monitoring Suppl (ONE TOUCH ULTRA SYSTEM KIT) w/Device KIT, 1 kit by Does not apply route once., Disp: 1 each, Rfl: 0 .  Cholecalciferol (VITAMIN D3) 2000 UNITS TABS, Take 1 tablet by mouth daily.  , Disp: , Rfl:  .  Cyanocobalamin (B-12) 5000 MCG CAPS, Take by mouth., Disp: , Rfl:  .  fexofenadine (ALLEGRA) 180 MG tablet, Take 180 mg by mouth daily., Disp: , Rfl:  .  fluticasone (FLONASE) 50 MCG/ACT nasal spray, Place into the nose daily as needed. , Disp: , Rfl:  .  glipiZIDE (GLUCOTROL) 5 MG tablet, TAKE 1 TABLET BY MOUTH TWICE A DAY BEFORE MEALS, Disp: 180 tablet, Rfl: 3 .  glucose blood (ONE TOUCH ULTRA TEST) test strip, USE AS INSTRUCTED, Disp: 300 each, Rfl: 4 .  glucose blood test strip, CHECK BLOOD SUGAR ONCE A DAY, Disp: , Rfl:  .  metFORMIN (GLUCOPHAGE) 1000 MG tablet, TAKE 1 TABLET TWICE DAILY  WITH MEALS., Disp: 180 tablet, Rfl: 1 .  metoprolol tartrate (LOPRESSOR) 25 MG tablet, TAKE 0.5 TABLETS (12.5 MG TOTAL) BY MOUTH 2 (TWO) TIMES DAILY., Disp: 90 tablet, Rfl: 3 .  Multiple  Vitamin (MULTIVITAMIN) tablet, Take 1 tablet by mouth daily.  , Disp: , Rfl:  .  nitroGLYCERIN (NITROSTAT) 0.4 MG SL tablet, Place 1 tablet (0.4 mg total) under the tongue every 5 (five) minutes as needed., Disp: 30 tablet, Rfl: 1 .  rosuvastatin (CRESTOR) 20 MG tablet, TAKE 1 TABLET DAILY, Disp: 90 tablet, Rfl: 2 .  triamterene-hydrochlorothiazide (MAXZIDE-25) 37.5-25 MG tablet, TAKE 1 TABLET BY MOUTH 1 DAY OR 1 DOSE., Disp: 90 tablet, Rfl: 1  EXAM: This was a telephone visit and thus no physical exam was completed.   ASSESSMENT AND PLAN:  Discussed the following assessment and plan:  Problem List Items Addressed This Visit    COVID-19    The patient is completing his home quarantine.  He is currently asymptomatic.  Discussed wearing a mask after he comes off of quarantine.  He will monitor for any recurrence of symptoms.      Diabetes mellitus type 2 in obese [facility]) (Chronic)    He will come in for lab work.  Continue glipizide 5 mg twice daily with meals and Metformin 1000 mg twice daily.  Urine microalbumin will be ordered as well.      Relevant Orders   Basic Metabolic Panel (BMET)   HgB A1c   Urine Microalbumin w/creat. ratio   Hypertension (Chronic)    Undetermined control.  He will have a nurse BP check in 2 weeks.  Continue triamterene/HCTZ 1 tablet once daily and metoprolol 25 mg twice daily.      Relevant Orders   Basic Metabolic Panel (BMET)   Malignant neoplasm of prostate [facility])    He will continue to follow-up with urology.          I discussed the assessment and treatment plan with the patient. The patient was provided an opportunity to ask questions and all were answered. The patient agreed with the plan and demonstrated an understanding of the instructions.   The patient was advised to call back or seek an in-person evaluation if the symptoms worsen or if the condition fails to improve as anticipated.  I provided 14 minutes of non-face-to-face time during  this encounter.   Tommi Rumps, MD

## 2020-11-07 NOTE — Assessment & Plan Note (Signed)
He will continue to follow-up with urology.

## 2020-11-07 NOTE — Assessment & Plan Note (Signed)
Undetermined control.  He will have a nurse BP check in 2 weeks.  Continue triamterene/HCTZ 1 tablet once daily and metoprolol 25 mg twice daily.

## 2020-11-07 NOTE — Assessment & Plan Note (Signed)
He will come in for lab work.  Continue glipizide 5 mg twice daily with meals and Metformin 1000 mg twice daily.  Urine microalbumin will be ordered as well.

## 2020-11-18 ENCOUNTER — Other Ambulatory Visit: Payer: Self-pay | Admitting: Family Medicine

## 2020-11-18 DIAGNOSIS — I10 Essential (primary) hypertension: Secondary | ICD-10-CM

## 2020-12-25 ENCOUNTER — Encounter: Payer: Self-pay | Admitting: Family Medicine

## 2020-12-25 ENCOUNTER — Other Ambulatory Visit: Payer: Self-pay

## 2020-12-25 ENCOUNTER — Ambulatory Visit (INDEPENDENT_AMBULATORY_CARE_PROVIDER_SITE_OTHER): Payer: Medicare Other | Admitting: Family Medicine

## 2020-12-25 VITALS — BP 123/70 | HR 63 | Temp 98.5°F | Ht 68.0 in | Wt 199.2 lb

## 2020-12-25 DIAGNOSIS — R0989 Other specified symptoms and signs involving the circulatory and respiratory systems: Secondary | ICD-10-CM | POA: Diagnosis not present

## 2020-12-25 DIAGNOSIS — E669 Obesity, unspecified: Secondary | ICD-10-CM | POA: Diagnosis not present

## 2020-12-25 DIAGNOSIS — E785 Hyperlipidemia, unspecified: Secondary | ICD-10-CM | POA: Diagnosis not present

## 2020-12-25 DIAGNOSIS — C61 Malignant neoplasm of prostate: Secondary | ICD-10-CM

## 2020-12-25 DIAGNOSIS — I10 Essential (primary) hypertension: Secondary | ICD-10-CM

## 2020-12-25 DIAGNOSIS — Z8616 Personal history of COVID-19: Secondary | ICD-10-CM | POA: Diagnosis not present

## 2020-12-25 DIAGNOSIS — E1142 Type 2 diabetes mellitus with diabetic polyneuropathy: Secondary | ICD-10-CM

## 2020-12-25 DIAGNOSIS — I2510 Atherosclerotic heart disease of native coronary artery without angina pectoris: Secondary | ICD-10-CM | POA: Diagnosis not present

## 2020-12-25 DIAGNOSIS — E1169 Type 2 diabetes mellitus with other specified complication: Secondary | ICD-10-CM | POA: Diagnosis not present

## 2020-12-25 LAB — BASIC METABOLIC PANEL
BUN: 22 mg/dL (ref 6–23)
CO2: 29 mEq/L (ref 19–32)
Calcium: 9.3 mg/dL (ref 8.4–10.5)
Chloride: 103 mEq/L (ref 96–112)
Creatinine, Ser: 1.51 mg/dL — ABNORMAL HIGH (ref 0.40–1.50)
GFR: 43.98 mL/min — ABNORMAL LOW (ref >60.00)
Glucose, Bld: 80 mg/dL (ref 70–99)
Potassium: 3.7 mEq/L (ref 3.5–5.1)
Sodium: 139 mEq/L (ref 135–145)

## 2020-12-25 LAB — MICROALBUMIN / CREATININE URINE RATIO
Creatinine,U: 72.4 mg/dL
Microalb Creat Ratio: 1.5 mg/g (ref 0.0–30.0)
Microalb, Ur: 1.1 mg/dL (ref 0.0–1.9)

## 2020-12-25 LAB — HEMOGLOBIN A1C: Hgb A1c MFr Bld: 6.9 % — ABNORMAL HIGH (ref 4.6–6.5)

## 2020-12-25 LAB — LDL CHOLESTEROL, DIRECT: Direct LDL: 57 mg/dL

## 2020-12-25 MED ORDER — METOPROLOL TARTRATE 25 MG PO TABS: ORAL_TABLET | ORAL | 3 refills | Status: AC

## 2020-12-25 MED ORDER — GLUCOSE BLOOD VI STRP: ORAL_STRIP | 4 refills | Status: DC

## 2020-12-25 MED ORDER — ROSUVASTATIN CALCIUM 20 MG PO TABS: 20.0000 mg | ORAL_TABLET | Freq: Every day | ORAL | 2 refills | Status: DC

## 2020-12-25 MED ORDER — NITROGLYCERIN 0.4 MG SL SUBL: 0.4000 mg | SUBLINGUAL_TABLET | SUBLINGUAL | 1 refills | Status: AC | PRN

## 2020-12-25 NOTE — Assessment & Plan Note (Signed)
Adequately controlled.  He will continue Maxzide 1 tablet once daily and metoprolol 25 mg twice daily.  Check labs.

## 2020-12-25 NOTE — Assessment & Plan Note (Signed)
Undetermined control.  We will check an A1c.  He will continue Metformin 1000 mg twice daily and glipizide 1 tablet twice daily prior to meals.  Check urine microalbumin.

## 2020-12-25 NOTE — Progress Notes (Signed)
Tommi Rumps, MD Phone: [phone]  Guy Phillips is a 80 y.o. male who presents today for f/u.  Patient is moving to [place].  DIABETES Disease Monitoring: Blood Sugar ranges-not checking Polyuria/phagia/dipsia- no      Optho- due Medications: Compliance- taking metformin, glipizide Hypoglycemic symptoms- no  HYPERTENSION  Disease Monitoring  Home BP Monitoring not checking Chest pain- no    Dyspnea- no Medications  Compliance-  Taking maxzide, metoprolol.   Edema- no  HYPERLIPIDEMIA Symptoms Chest pain on exertion:  no   Leg claudication:  no Medications: Compliance- taking crestor Right upper quadrant pain- no  Muscle aches- no  History of COVID-19: Patient had Covid several months ago.  He notes he generally did not feel all that ill.  He did lose his sense of taste and notes that has contributed to some weight loss as he continues to have loss of taste.  Food just does not taste is good.  They do eat out a lot.  History of prostate cancer: He had successful radiation treatment per his report.  He notes his most recent PSA was undetectable.  He is still getting hormone injections though notes he will be due for his third injection after he moves to [place].    Social History   Tobacco Use  Smoking Status Former Smoker  . Packs/day: 3.00  . Years: 33.00  . Pack years: 99.00  . Types: Cigarettes  . Quit date: 07/03/1991  . Years since quitting: 29.5  Smokeless Tobacco Never Used    Current Outpatient Medications on File Prior to Visit  Medication Sig Dispense Refill  . acetaminophen (TYLENOL) 325 MG tablet Take by mouth every 6 (six) hours as needed.     Marland Kitchen aspirin 81 MG tablet Take 81 mg by mouth daily.    . Blood Glucose Monitoring Suppl (ONE TOUCH ULTRA 2) w/Device KIT USE AS DIRECTED    . Blood Glucose Monitoring Suppl (ONE TOUCH ULTRA SYSTEM KIT) w/Device KIT 1 kit by Does not apply route once. 1 each 0  . Cholecalciferol (VITAMIN D3) 2000 UNITS  TABS Take 1 tablet by mouth daily.    . Cyanocobalamin (B-12) 5000 MCG CAPS Take by mouth.    . fexofenadine (ALLEGRA) 180 MG tablet Take 180 mg by mouth daily.    . fluticasone (FLONASE) 50 MCG/ACT nasal spray Place into the nose daily as needed.     Marland Kitchen glipiZIDE (GLUCOTROL) 5 MG tablet TAKE 1 TABLET BY MOUTH TWICE A DAY BEFORE MEALS 180 tablet 3  . glucose blood test strip CHECK BLOOD SUGAR ONCE A DAY    . metFORMIN (GLUCOPHAGE) 1000 MG tablet TAKE 1 TABLET TWICE DAILY  WITH MEALS. 180 tablet 1  . Multiple Vitamin (MULTIVITAMIN) tablet Take 1 tablet by mouth daily.    Marland Kitchen triamterene-hydrochlorothiazide (MAXZIDE-25) 37.5-25 MG tablet TAKE 1 TABLET BY MOUTH 1 DAY OR 1 DOSE. 90 tablet 1   No current facility-administered medications on file prior to visit.     ROS see history of present illness  Objective  Physical Exam Vitals:   12/25/20 1133  BP: 123/70  Pulse: 63  Temp: 98.5 F (36.9 C)  SpO2: 97%    BP Readings from Last 3 Encounters:  12/25/20 123/70  05/03/20 120/70  04/04/20 110/60   Wt Readings from Last 3 Encounters:  12/25/20 199 lb 3.2 oz (90.4 kg)  07/21/20 204 lb (92.5 kg)  05/03/20 207 lb 12.8 oz (94.3 kg)    Physical Exam Constitutional:  General: He is not in acute distress.    Appearance: He is not diaphoretic.  Cardiovascular:     Rate and Rhythm: Normal rate and regular rhythm.     Heart sounds: Normal heart sounds.  Pulmonary:     Effort: Pulmonary effort is normal.     Breath sounds: Normal breath sounds.  Musculoskeletal:     Right lower leg: No edema.     Left lower leg: No edema.  Skin:    General: Skin is warm and dry.  Neurological:     Mental Status: He is alert.    Diabetic Foot Exam - Simple   Simple Foot Form Diabetic Foot exam was performed with the following findings: Yes 12/25/2020 11:55 AM  Visual Inspection No deformities, no ulcerations, no other skin breakdown bilaterally: Yes Sensation Testing See comments:  Yes Pulse Check See comments: Yes Comments Difficult to palpate pedal pulses bilaterally, monofilament sensation decreased bilateral feet      Assessment/Plan: Please see individual problem list.  Problem List Items Addressed This Visit    CAD (coronary artery disease) (Chronic)    Continue risk factor management.      Relevant Medications   metoprolol tartrate (LOPRESSOR) 25 MG tablet   nitroGLYCERIN (NITROSTAT) 0.4 MG SL tablet   rosuvastatin (CRESTOR) 20 MG tablet   Decreased pedal pulses    Discussed that I would typically order ABIs for this.  Since he is moving this complicates the matter.  I provided him with information regarding this on his AVS so that he can present this to his new doctor in [place] so they can get these ordered for him.      Diabetes mellitus type 2 in obese (HCC) (Chronic)    Undetermined control.  We will check an A1c.  He will continue Metformin 1000 mg twice daily and glipizide 1 tablet twice daily prior to meals.  Check urine microalbumin.      Relevant Medications   glucose blood (ONE TOUCH ULTRA TEST) test strip   rosuvastatin (CRESTOR) 20 MG tablet   Other Relevant Orders   HgB A1c   Urine Microalbumin w/creat. ratio   History of COVID-19    Patient has generally done well.  He does still have loss of taste.  Discussed that this may last some undetermined amount of time.  This has likely contributed to his very mild weight loss.      Hyperlipidemia   Relevant Medications   metoprolol tartrate (LOPRESSOR) 25 MG tablet   nitroGLYCERIN (NITROSTAT) 0.4 MG SL tablet   rosuvastatin (CRESTOR) 20 MG tablet   Other Relevant Orders   Direct LDL   Hypertension - Primary (Chronic)    Adequately controlled.  He will continue Maxzide 1 tablet once daily and metoprolol 25 mg twice daily.  Check labs.      Relevant Medications   metoprolol tartrate (LOPRESSOR) 25 MG tablet   nitroGLYCERIN (NITROSTAT) 0.4 MG SL tablet   rosuvastatin (CRESTOR) 20  MG tablet   Other Relevant Orders   Basic Metabolic Panel (BMET)   Malignant neoplasm of prostate [facility])    The patient is currently undergoing treatment with urology.  I encouraged him to get set up with a urologist as soon as possible once he moves to [place].      Neuropathy, diabetic (HCC)    Generally stable.  Encouraged him to monitor his feet closely and seek medical attention if he has slowly healing wounds or if anything starts to look infected.  Relevant Medications   rosuvastatin (CRESTOR) 20 MG tablet    Other Visit Diagnoses    Essential hypertension       Relevant Medications   metoprolol tartrate (LOPRESSOR) 25 MG tablet   nitroGLYCERIN (NITROSTAT) 0.4 MG SL tablet   rosuvastatin (CRESTOR) 20 MG tablet       This visit occurred during the SARS-CoV-2 public health emergency.  Safety protocols were in place, including screening questions prior to the visit, additional usage of staff PPE, and extensive cleaning of exam room while observing appropriate contact time as indicated for disinfecting solutions.    Tommi Rumps, MD [place]

## 2020-12-25 NOTE — Assessment & Plan Note (Signed)
Generally stable.  Encouraged him to monitor his feet closely and seek medical attention if he has slowly healing wounds or if anything starts to look infected.

## 2020-12-25 NOTE — Assessment & Plan Note (Signed)
The patient is currently undergoing treatment with urology.  I encouraged him to get set up with a urologist as soon as possible once he moves to [place].

## 2020-12-25 NOTE — Assessment & Plan Note (Signed)
Continue risk factor management. 

## 2020-12-25 NOTE — Assessment & Plan Note (Signed)
Patient has generally done well.  He does still have loss of taste.  Discussed that this may last some undetermined amount of time.  This has likely contributed to his very mild weight loss.

## 2020-12-25 NOTE — Patient Instructions (Signed)
Nice to see you. We will get labs today. Once you get to [place] you need to get established with a primary care physician.  I would suggest that you have ABIs given the difficulty to palpate your pulses in your feet.  They should be able to get you scheduled for this there.

## 2020-12-25 NOTE — Assessment & Plan Note (Signed)
Discussed that I would typically order ABIs for this.  Since he is moving this complicates the matter.  I provided him with information regarding this on his AVS so that he can present this to his new doctor in [place] so they can get these ordered for him.

## 2020-12-28 ENCOUNTER — Other Ambulatory Visit (INDEPENDENT_AMBULATORY_CARE_PROVIDER_SITE_OTHER): Payer: Medicare Other

## 2020-12-28 ENCOUNTER — Other Ambulatory Visit: Payer: Self-pay

## 2020-12-28 DIAGNOSIS — I10 Essential (primary) hypertension: Secondary | ICD-10-CM | POA: Diagnosis not present

## 2020-12-28 DIAGNOSIS — E669 Obesity, unspecified: Secondary | ICD-10-CM | POA: Diagnosis not present

## 2020-12-28 DIAGNOSIS — E1169 Type 2 diabetes mellitus with other specified complication: Secondary | ICD-10-CM

## 2020-12-29 LAB — BASIC METABOLIC PANEL
BUN: 21 mg/dL (ref 6–23)
CO2: 27 mEq/L (ref 19–32)
Calcium: 9.4 mg/dL (ref 8.4–10.5)
Chloride: 101 mEq/L (ref 96–112)
Creatinine, Ser: 1.54 mg/dL — ABNORMAL HIGH (ref 0.40–1.50)
GFR: 42.95 mL/min — ABNORMAL LOW (ref >60.00)
Glucose, Bld: 193 mg/dL — ABNORMAL HIGH (ref 70–99)
Potassium: 3.8 mEq/L (ref 3.5–5.1)
Sodium: 137 mEq/L (ref 135–145)

## 2021-01-25 ENCOUNTER — Other Ambulatory Visit: Payer: Self-pay | Admitting: Family Medicine

## 2021-01-25 DIAGNOSIS — E785 Hyperlipidemia, unspecified: Secondary | ICD-10-CM

## 2021-03-28 ENCOUNTER — Other Ambulatory Visit: Payer: Self-pay

## 2021-03-28 DIAGNOSIS — E1169 Type 2 diabetes mellitus with other specified complication: Secondary | ICD-10-CM

## 2021-03-28 DIAGNOSIS — E669 Obesity, unspecified: Secondary | ICD-10-CM

## 2021-03-28 MED ORDER — GLUCOSE BLOOD VI STRP: ORAL_STRIP | 2 refills | Status: DC

## 2021-03-28 MED ORDER — GLUCOSE BLOOD VI STRP: ORAL_STRIP | 2 refills | Status: AC

## 2021-04-29 ENCOUNTER — Other Ambulatory Visit: Payer: Self-pay | Admitting: Family Medicine

## 2021-04-29 DIAGNOSIS — I10 Essential (primary) hypertension: Secondary | ICD-10-CM

## 2021-04-30 NOTE — Telephone Encounter (Signed)
I believe he moved to [place].  Can you contact him and see if he has a new doctor there?

## 2021-04-30 NOTE — Telephone Encounter (Signed)
Refilled: 11/20/2020 Last OV: 12/25/2020 Next OV: not scheduled  Abnormal BMET on 12/28/2020

## 2021-05-17 NOTE — Telephone Encounter (Signed)
This patient moved to [place]. He should have a PCP there. Please call him to confirm that he has a PCP and if he does not he really needs to establish with one there for refills. Please let me know if he is not seeing a new PCP in [place]. Thanks.

## 2021-05-17 NOTE — Telephone Encounter (Signed)
Spoke with pt and he stated that he has moved to [place] but has not been able to find a doctor yet. Pt would like to know if you could refill this medication for another 90 days.

## 2021-05-18 MED ORDER — TRIAMTERENE-HCTZ 37.5-25 MG PO TABS
1.0000 | ORAL_TABLET | Freq: Every day | ORAL | 0 refills | Status: DC
Start: 2021-05-18 — End: 2021-08-14

## 2021-05-18 NOTE — Telephone Encounter (Signed)
This was sent to his pharmacy.  He does need to establish with a new PCP to receive future refills from them.

## 2021-05-18 NOTE — Telephone Encounter (Signed)
Spoke with pt and he stated that he has moved to [place] but has not been able to find a doctor yet. Pt would like to know if you could refill this medication for another 90 days.      Note

## 2021-07-13 ENCOUNTER — Other Ambulatory Visit: Payer: Self-pay | Admitting: Family Medicine

## 2021-07-24 ENCOUNTER — Ambulatory Visit: Payer: Medicare Other

## 2021-08-14 ENCOUNTER — Other Ambulatory Visit: Payer: Self-pay | Admitting: Family Medicine

## 2021-08-14 DIAGNOSIS — I10 Essential (primary) hypertension: Secondary | ICD-10-CM

## 2021-10-21 ENCOUNTER — Other Ambulatory Visit: Payer: Self-pay | Admitting: Family Medicine

## 2021-10-21 DIAGNOSIS — E785 Hyperlipidemia, unspecified: Secondary | ICD-10-CM

## 2021-12-08 ENCOUNTER — Other Ambulatory Visit: Payer: Self-pay | Admitting: Family Medicine

## 2022-05-30 ENCOUNTER — Other Ambulatory Visit: Payer: Self-pay | Admitting: Family Medicine

## 2022-05-30 DIAGNOSIS — E785 Hyperlipidemia, unspecified: Secondary | ICD-10-CM

## 2023-12-16 ENCOUNTER — Telehealth: Payer: Self-pay | Admitting: Family Medicine

## 2023-12-16 NOTE — Telephone Encounter (Signed)
 Unable to contact patient to- Dr Birdie Sons has left the practice. If you wish to continue care at this office, please call and schedule a transfer of care to one of the following providers: Dr Charlann Lange, MD,  Darleen Crocker or Kara Dies, NP.  If Patient calls back [id number] please schedule TOC
